# Patient Record
Sex: Male | Born: 1945 | Race: Black or African American | Hispanic: No | Marital: Married | Smoking: Former smoker
Health system: Southern US, Community
[De-identification: ages and names within clinical notes are randomized; demographics above are authoritative.]

## PROBLEM LIST (undated history)

## (undated) DIAGNOSIS — E785 Hyperlipidemia, unspecified: Secondary | ICD-10-CM

## (undated) DIAGNOSIS — M1990 Unspecified osteoarthritis, unspecified site: Secondary | ICD-10-CM

## (undated) DIAGNOSIS — Z860100 Personal history of colon polyps, unspecified: Secondary | ICD-10-CM

## (undated) DIAGNOSIS — Z87442 Personal history of urinary calculi: Secondary | ICD-10-CM

## (undated) DIAGNOSIS — M2540 Effusion, unspecified joint: Secondary | ICD-10-CM

## (undated) DIAGNOSIS — J302 Other seasonal allergic rhinitis: Secondary | ICD-10-CM

## (undated) DIAGNOSIS — M1712 Unilateral primary osteoarthritis, left knee: Secondary | ICD-10-CM

## (undated) DIAGNOSIS — N529 Male erectile dysfunction, unspecified: Secondary | ICD-10-CM

## (undated) DIAGNOSIS — I10 Essential (primary) hypertension: Secondary | ICD-10-CM

## (undated) DIAGNOSIS — K5790 Diverticulosis of intestine, part unspecified, without perforation or abscess without bleeding: Secondary | ICD-10-CM

## (undated) DIAGNOSIS — C61 Malignant neoplasm of prostate: Secondary | ICD-10-CM

## (undated) DIAGNOSIS — H269 Unspecified cataract: Secondary | ICD-10-CM

## (undated) DIAGNOSIS — M2550 Pain in unspecified joint: Secondary | ICD-10-CM

## (undated) HISTORY — PX: I & D EXTREMITY: SHX5045

## (undated) HISTORY — PX: MULTIPLE TOOTH EXTRACTIONS: SHX2053

## (undated) HISTORY — PX: BACK SURGERY: SHX140

## (undated) HISTORY — DX: Essential (primary) hypertension: I10

## (undated) HISTORY — DX: Hyperlipidemia, unspecified: E78.5

## (undated) HISTORY — DX: Unspecified osteoarthritis, unspecified site: M19.90

## (undated) HISTORY — PX: TONSILLECTOMY AND ADENOIDECTOMY: SUR1326

## (undated) HISTORY — PX: JOINT REPLACEMENT: SHX530

## (undated) HISTORY — PX: KNEE ARTHROSCOPY: SHX127

## (undated) HISTORY — DX: Unilateral primary osteoarthritis, left knee: M17.12

---
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
## 1969-06-13 HISTORY — PX: LUMBAR DISC SURGERY: SHX700

## 2008-02-28 ENCOUNTER — Ambulatory Visit: Payer: Self-pay | Admitting: Unknown Physician Specialty

## 2008-03-01 ENCOUNTER — Ambulatory Visit: Payer: Self-pay | Admitting: Unknown Physician Specialty

## 2008-03-07 ENCOUNTER — Other Ambulatory Visit: Payer: Self-pay

## 2008-03-07 ENCOUNTER — Ambulatory Visit: Payer: Self-pay | Admitting: Unknown Physician Specialty

## 2008-03-07 ENCOUNTER — Inpatient Hospital Stay: Payer: Self-pay | Admitting: Unknown Physician Specialty

## 2008-03-17 ENCOUNTER — Ambulatory Visit: Payer: Self-pay | Admitting: Unknown Physician Specialty

## 2011-07-14 HISTORY — PX: LIPOMA EXCISION: SHX5283

## 2011-07-14 HISTORY — PX: COLONOSCOPY W/ BIOPSIES AND POLYPECTOMY: SHX1376

## 2011-09-23 ENCOUNTER — Ambulatory Visit: Payer: Self-pay | Admitting: General Surgery

## 2011-09-30 ENCOUNTER — Ambulatory Visit: Payer: Self-pay | Admitting: General Surgery

## 2011-10-03 LAB — PATHOLOGY REPORT

## 2012-03-29 ENCOUNTER — Encounter (HOSPITAL_COMMUNITY): Payer: Self-pay | Admitting: Respiratory Therapy

## 2012-03-30 ENCOUNTER — Encounter: Payer: Self-pay | Admitting: Physician Assistant

## 2012-03-30 ENCOUNTER — Other Ambulatory Visit: Payer: Self-pay | Admitting: Physician Assistant

## 2012-03-30 DIAGNOSIS — M1990 Unspecified osteoarthritis, unspecified site: Secondary | ICD-10-CM

## 2012-03-30 DIAGNOSIS — M1711 Unilateral primary osteoarthritis, right knee: Secondary | ICD-10-CM

## 2012-03-30 DIAGNOSIS — I10 Essential (primary) hypertension: Secondary | ICD-10-CM | POA: Insufficient documentation

## 2012-03-30 NOTE — H&P (Signed)
Andre Jackson is an 65 y.o. male.   Chief Complaint: right knee DJD HPI: Andre Jackson comes in today for evaluation of his right knee.  He has a longstanding history of bilateral knee pain and has failed conservative care, including anti-inflammatories and intraarticular Cortisone injections.  At this point in time he has significant bilateral knee pain, right worse than left.  He has excruciating pain that has failed anti-inflammatories, pain medicine, home exercise program and intraarticular Cortisone injections.  He requires assistance to rise from a chair, assistance going up and down stairs.    Past Medical History  Diagnosis Date  . Hypertension   . Hyperlipidemia   . Arthritis   . Right knee DJD     Past Surgical History  Procedure Date  . Back surgery   . Dental surgery   . Colonoscopy   . Lipoma excision     neck    Family History  Problem Relation Age of Onset  . Cerebral aneurysm Mother   . Hypertension Sister   . Hypertension Mother   . Hypertension Brother    Social History:  reports that he quit smoking about 6 years ago. He does not have any smokeless tobacco history on file. He reports that he drinks alcohol. His drug history not on file.  Allergies: No Known Allergies Current Outpatient Prescriptions on File Prior to Visit  Medication Sig Dispense Refill  . aspirin 81 MG tablet Take 81 mg by mouth daily.      . atorvastatin (LIPITOR) 10 MG tablet Take 10 mg by mouth daily.      . fexofenadine (ALLEGRA) 180 MG tablet Take 180 mg by mouth daily.      . lisinopril-hydrochlorothiazide (PRINZIDE,ZESTORETIC) 20-25 MG per tablet Take 1 tablet by mouth daily.      . meloxicam (MOBIC) 15 MG tablet Take 15 mg by mouth daily.      . Multiple Vitamin (MULTIVITAMIN WITH MINERALS) TABS Take 1 tablet by mouth daily.        (Not in a hospital admission)  No results found for this or any previous visit (from the past 48 hour(s)). No results found.  Review of Systems    Constitutional: Negative.   HENT: Negative.   Eyes: Negative.   Respiratory: Negative.   Cardiovascular: Negative.   Gastrointestinal: Negative.   Genitourinary: Negative.   Musculoskeletal: Positive for joint pain.       Right knee  Skin: Negative.   Neurological: Negative.   Endo/Heme/Allergies: Negative.   Psychiatric/Behavioral: Negative.     Blood pressure 123/78, pulse 81, temperature 98.3 F (36.8 C), height 5' 7" (1.702 m), weight 81.647 kg (180 lb), SpO2 98.00%. Physical Exam  Constitutional: He is oriented to person, place, and time. He appears well-developed and well-nourished.  HENT:  Head: Normocephalic and atraumatic.  Mouth/Throat: Oropharynx is clear and moist.  Eyes: Conjunctivae are normal. Pupils are equal, round, and reactive to light.  Neck: Neck supple.  Cardiovascular: Normal rate and regular rhythm.   Respiratory: Effort normal and breath sounds normal.  GI: Soft. Bowel sounds are normal.  Genitourinary:       Not pertinent to current symptomatology therefore not examined.  Musculoskeletal:        He is alert and oriented x 3.  He is independently ambulatory with a moderately antalgic gait.  No assistive devices.  Right knee has active range of motion -5 to 120 degrees.  3+ crepitus.  Varus deformity.  1+ synovitis.  Medial joint   line tenderness.  Normal patella tracking.  Left knee has active range of motion -5 to 120 degrees.  2+ crepitus.  1+ synovitis.  Medial joint line tenderness.  Varus deformity.  2+ dorsalis pedis pulses.    Neurological: He is alert and oriented to person, place, and time.  Skin: Skin is warm and dry.     Assessment Patient Active Problem List  Diagnosis  . Hypertension  . Arthritis  . Right knee DJD    Plan At this point in time we discussed in detail the risks, benefits and possible complications of a right total knee replacement.  He understands this and is without question.  We will plan on getting him scheduled.  He  has gotten medical clearance by Dr. John Walker prior to scheduling his knee replacement. He will go home with his wife after surgery  Edelmiro Innocent J 03/30/2012, 4:04 PM    

## 2012-04-01 ENCOUNTER — Encounter (HOSPITAL_COMMUNITY)
Admission: RE | Admit: 2012-04-01 | Discharge: 2012-04-01 | Disposition: A | Discharge status: Home / Self Care | Payer: Medicare Other | Source: Ambulatory Visit | Attending: Orthopedic Surgery | Admitting: Orthopedic Surgery

## 2012-04-01 ENCOUNTER — Encounter (HOSPITAL_COMMUNITY): Payer: Self-pay

## 2012-04-01 HISTORY — DX: Effusion, unspecified joint: M25.40

## 2012-04-01 HISTORY — DX: Unspecified cataract: H26.9

## 2012-04-01 HISTORY — DX: Personal history of colonic polyps: Z86.010

## 2012-04-01 HISTORY — DX: Diverticulosis of intestine, part unspecified, without perforation or abscess without bleeding: K57.90

## 2012-04-01 HISTORY — DX: Other seasonal allergic rhinitis: J30.2

## 2012-04-01 HISTORY — DX: Personal history of colon polyps, unspecified: Z86.0100

## 2012-04-01 HISTORY — DX: Pain in unspecified joint: M25.50

## 2012-04-01 LAB — PROTIME-INR
INR: 0.97 (ref 0.00–1.49)
Prothrombin Time: 13.1 seconds (ref 11.6–15.2)

## 2012-04-01 LAB — COMPREHENSIVE METABOLIC PANEL
Albumin: 3.9 g/dL (ref 3.5–5.2)
Alkaline Phosphatase: 68 U/L (ref 39–117)
BUN: 16 mg/dL (ref 6–23)
Calcium: 9.4 mg/dL (ref 8.4–10.5)
GFR calc Af Amer: >90 mL/min (ref >90)
Potassium: 3.5 mEq/L (ref 3.5–5.1)
Sodium: 141 mEq/L (ref 135–145)
Total Protein: 7.5 g/dL (ref 6.0–8.3)

## 2012-04-01 LAB — URINALYSIS, ROUTINE W REFLEX MICROSCOPIC
Bilirubin Urine: NEGATIVE
Ketones, ur: NEGATIVE mg/dL
Leukocytes, UA: NEGATIVE
Nitrite: NEGATIVE
Protein, ur: NEGATIVE mg/dL
Urobilinogen, UA: 1 mg/dL (ref 0.0–1.0)
pH: 6 (ref 5.0–8.0)

## 2012-04-01 LAB — DIFFERENTIAL
Eosinophils Absolute: 0.1 10*3/uL (ref 0.0–0.7)
Lymphocytes Relative: 46 % (ref 12–46)
Lymphs Abs: 2.5 10*3/uL (ref 0.7–4.0)
Monocytes Relative: 10 % (ref 3–12)
Neutrophils Relative %: 42 % — ABNORMAL LOW (ref 43–77)

## 2012-04-01 LAB — APTT: aPTT: 29 seconds (ref 24–37)

## 2012-04-01 LAB — TYPE AND SCREEN
ABO/RH(D): A POS
Antibody Screen: NEGATIVE

## 2012-04-01 LAB — CBC
HCT: 41.8 % (ref 39.0–52.0)
MCH: 30.1 pg (ref 26.0–34.0)
MCHC: 34 g/dL (ref 30.0–36.0)
RDW: 13.6 % (ref 11.5–15.5)

## 2012-04-01 LAB — SURGICAL PCR SCREEN: MRSA, PCR: NEGATIVE

## 2012-04-01 MED ORDER — CHLORHEXIDINE GLUCONATE 4 % EX LIQD: 60.0000 mL | Freq: Once | CUTANEOUS | Status: DC

## 2012-04-01 NOTE — Progress Notes (Signed)
Pt doesn't have a cardiologist  Denies ever having a stress test/echo/heart cath  Medical Md is Dr.John Environmental consultant at [facility] in [place]  Last EKG and CXR done in Nov 2012-to request from [place]

## 2012-04-01 NOTE — Pre-Procedure Instructions (Signed)
18 Andre Jackson  04/01/2012   Your procedure is scheduled on:  Mon, June 24 @! 8:45 AM  Report to [facility] at 5:45 AM.  Call this number if you have problems the morning of surgery: [phone]   Remember:   Do not eat food:After Midnight.  Take these medicines the morning of surgery with A SIP OF WATER: Allegra(Fexofenadine)   Do not wear jewelry  Do not wear lotions, powders, or cologne  Men may shave face and neck.  Do not bring valuables to the hospital.  Contacts, dentures or bridgework may not be worn into surgery.  Leave suitcase in the car. After surgery it may be brought to your room.  For patients admitted to the hospital, checkout time is 11:00 AM the day of discharge.   Patients discharged the day of surgery will not be allowed to drive home.  Special Instructions: CHG Shower Use Special Wash: 1/2 bottle night before surgery and 1/2 bottle morning of surgery.   Please read over the following fact sheets that you were given: Pain Booklet, Coughing and Deep Breathing, Blood Transfusion Information, Total Joint Packet, MRSA Information and Surgical Site Infection Prevention

## 2012-04-02 LAB — ABO/RH: ABO/RH(D): A POS

## 2012-04-02 LAB — URINE CULTURE: Culture  Setup Time: 201306201403

## 2012-04-04 MED ORDER — CEFAZOLIN SODIUM-DEXTROSE 2-3 GM-% IV SOLR
2.0000 g | INTRAVENOUS | Status: AC
  Administered 2012-04-05: 2 g via INTRAVENOUS
  Filled 2012-04-04: qty 50

## 2012-04-04 MED ORDER — POVIDONE-IODINE 7.5 % EX SOLN
Freq: Once | CUTANEOUS | Status: DC
  Filled 2012-04-04: qty 118

## 2012-04-04 MED ORDER — LACTATED RINGERS IV SOLN
INTRAVENOUS | Status: DC
  Administered 2012-04-05: 09:00:00 via INTRAVENOUS

## 2012-04-05 ENCOUNTER — Encounter (HOSPITAL_COMMUNITY)
Admission: RE | Disposition: A | Discharge status: Home Health | Payer: Self-pay | Source: Ambulatory Visit | Attending: Orthopedic Surgery

## 2012-04-05 ENCOUNTER — Encounter (HOSPITAL_COMMUNITY): Payer: Self-pay | Admitting: Certified Registered"

## 2012-04-05 ENCOUNTER — Inpatient Hospital Stay (HOSPITAL_COMMUNITY)
Admission: RE | Admit: 2012-04-05 | Discharge: 2012-04-07 | DRG: 470 | Disposition: A | Discharge status: Home Health | Payer: Medicare Other | Source: Ambulatory Visit | Attending: Orthopedic Surgery | Admitting: Orthopedic Surgery

## 2012-04-05 ENCOUNTER — Ambulatory Visit (HOSPITAL_COMMUNITY): Payer: Medicare Other

## 2012-04-05 ENCOUNTER — Ambulatory Visit (HOSPITAL_COMMUNITY): Payer: Medicare Other | Admitting: Certified Registered"

## 2012-04-05 ENCOUNTER — Encounter (HOSPITAL_COMMUNITY): Payer: Self-pay | Admitting: *Deleted

## 2012-04-05 DIAGNOSIS — M1710 Unilateral primary osteoarthritis, unspecified knee: Principal | ICD-10-CM | POA: Diagnosis present

## 2012-04-05 DIAGNOSIS — E785 Hyperlipidemia, unspecified: Secondary | ICD-10-CM | POA: Diagnosis present

## 2012-04-05 DIAGNOSIS — M1990 Unspecified osteoarthritis, unspecified site: Secondary | ICD-10-CM | POA: Diagnosis present

## 2012-04-05 DIAGNOSIS — I10 Essential (primary) hypertension: Secondary | ICD-10-CM | POA: Diagnosis present

## 2012-04-05 DIAGNOSIS — M1711 Unilateral primary osteoarthritis, right knee: Secondary | ICD-10-CM

## 2012-04-05 DIAGNOSIS — D62 Acute posthemorrhagic anemia: Secondary | ICD-10-CM | POA: Diagnosis present

## 2012-04-05 DIAGNOSIS — Z01812 Encounter for preprocedural laboratory examination: Secondary | ICD-10-CM

## 2012-04-05 HISTORY — PX: REPLACEMENT TOTAL KNEE: SUR1224

## 2012-04-05 HISTORY — PX: TOTAL KNEE ARTHROPLASTY: SHX125

## 2012-04-05 SURGERY — ARTHROPLASTY, KNEE, TOTAL
Anesthesia: General | Site: Knee | Laterality: Right | Wound class: Clean

## 2012-04-05 MED ORDER — MIDAZOLAM HCL 2 MG/2ML IJ SOLN
INTRAMUSCULAR | Status: AC
  Filled 2012-04-05: qty 2

## 2012-04-05 MED ORDER — ENOXAPARIN SODIUM 30 MG/0.3ML ~~LOC~~ SOLN
30.0000 mg | Freq: Two times a day (BID) | SUBCUTANEOUS | Status: DC
  Administered 2012-04-06 – 2012-04-07 (×3): 30 mg via SUBCUTANEOUS
  Filled 2012-04-05 (×5): qty 0.3

## 2012-04-05 MED ORDER — ACETAMINOPHEN 650 MG RE SUPP: 650.0000 mg | Freq: Four times a day (QID) | RECTAL | Status: DC | PRN

## 2012-04-05 MED ORDER — HYDROMORPHONE HCL PF 1 MG/ML IJ SOLN
0.2500 mg | INTRAMUSCULAR | Status: DC | PRN
  Administered 2012-04-05 (×2): 0.5 mg via INTRAVENOUS

## 2012-04-05 MED ORDER — CEFUROXIME SODIUM 1.5 G IJ SOLR
INTRAMUSCULAR | Status: DC | PRN
  Administered 2012-04-05: 1.5 g

## 2012-04-05 MED ORDER — PHENYLEPHRINE HCL 10 MG/ML IJ SOLN
INTRAMUSCULAR | Status: DC | PRN
  Administered 2012-04-05: 40 ug via INTRAVENOUS
  Administered 2012-04-05 (×2): 80 ug via INTRAVENOUS

## 2012-04-05 MED ORDER — FENTANYL CITRATE 0.05 MG/ML IJ SOLN: 100.0000 ug | Freq: Once | INTRAMUSCULAR | Status: DC

## 2012-04-05 MED ORDER — FENTANYL CITRATE 0.05 MG/ML IJ SOLN
INTRAMUSCULAR | Status: AC
  Filled 2012-04-05: qty 2

## 2012-04-05 MED ORDER — ACETAMINOPHEN 10 MG/ML IV SOLN
1000.0000 mg | Freq: Once | INTRAVENOUS | Status: AC | PRN
  Administered 2012-04-05: 1000 mg via INTRAVENOUS

## 2012-04-05 MED ORDER — CELECOXIB 200 MG PO CAPS
200.0000 mg | ORAL_CAPSULE | Freq: Two times a day (BID) | ORAL | Status: DC
  Administered 2012-04-05 – 2012-04-07 (×5): 200 mg via ORAL
  Filled 2012-04-05 (×6): qty 1

## 2012-04-05 MED ORDER — ATORVASTATIN CALCIUM 10 MG PO TABS
10.0000 mg | ORAL_TABLET | Freq: Every day | ORAL | Status: DC
  Administered 2012-04-06 – 2012-04-07 (×2): 10 mg via ORAL
  Filled 2012-04-05 (×2): qty 1

## 2012-04-05 MED ORDER — PHENOL 1.4 % MT LIQD: 1.0000 | OROMUCOSAL | Status: DC | PRN

## 2012-04-05 MED ORDER — POTASSIUM CHLORIDE IN NACL 20-0.9 MEQ/L-% IV SOLN
INTRAVENOUS | Status: DC
  Administered 2012-04-05 – 2012-04-06 (×2): via INTRAVENOUS
  Filled 2012-04-05 (×6): qty 1000

## 2012-04-05 MED ORDER — FENTANYL CITRATE 0.05 MG/ML IJ SOLN
INTRAMUSCULAR | Status: DC | PRN
  Administered 2012-04-05: 100 ug via INTRAVENOUS
  Administered 2012-04-05 (×2): 50 ug via INTRAVENOUS

## 2012-04-05 MED ORDER — HYDROMORPHONE HCL PF 1 MG/ML IJ SOLN
INTRAMUSCULAR | Status: AC
  Filled 2012-04-05: qty 1

## 2012-04-05 MED ORDER — SORBITOL 70 % SOLN
30.0000 mL | Freq: Two times a day (BID) | Status: DC
  Administered 2012-04-05 – 2012-04-07 (×4): 30 mL via ORAL
  Filled 2012-04-05 (×7): qty 30

## 2012-04-05 MED ORDER — PROPOFOL 10 MG/ML IV EMUL
INTRAVENOUS | Status: DC | PRN
  Administered 2012-04-05: 170 mg via INTRAVENOUS

## 2012-04-05 MED ORDER — METOCLOPRAMIDE HCL 5 MG/ML IJ SOLN: 5.0000 mg | Freq: Three times a day (TID) | INTRAMUSCULAR | Status: DC | PRN

## 2012-04-05 MED ORDER — LORATADINE 10 MG PO TABS
10.0000 mg | ORAL_TABLET | Freq: Every day | ORAL | Status: DC
  Administered 2012-04-05 – 2012-04-07 (×3): 10 mg via ORAL
  Filled 2012-04-05 (×3): qty 1

## 2012-04-05 MED ORDER — CEFUROXIME SODIUM 1.5 G IJ SOLR
INTRAMUSCULAR | Status: AC
  Filled 2012-04-05: qty 1.5

## 2012-04-05 MED ORDER — MENTHOL 3 MG MT LOZG: 1.0000 | LOZENGE | OROMUCOSAL | Status: DC | PRN

## 2012-04-05 MED ORDER — MIDAZOLAM HCL 2 MG/2ML IJ SOLN: 2.0000 mg | Freq: Once | INTRAMUSCULAR | Status: DC

## 2012-04-05 MED ORDER — LISINOPRIL 20 MG PO TABS
20.0000 mg | ORAL_TABLET | Freq: Every day | ORAL | Status: DC
  Administered 2012-04-07: 20 mg via ORAL
  Filled 2012-04-05 (×3): qty 1

## 2012-04-05 MED ORDER — HYDROMORPHONE HCL PF 1 MG/ML IJ SOLN
0.5000 mg | INTRAMUSCULAR | Status: DC | PRN
  Administered 2012-04-05 (×2): 1 mg via INTRAVENOUS
  Filled 2012-04-05 (×2): qty 1

## 2012-04-05 MED ORDER — BUPIVACAINE-EPINEPHRINE PF 0.25-1:200000 % IJ SOLN
INTRAMUSCULAR | Status: AC
  Filled 2012-04-05: qty 30

## 2012-04-05 MED ORDER — FENTANYL CITRATE 0.05 MG/ML IJ SOLN
50.0000 ug | INTRAMUSCULAR | Status: DC | PRN
  Administered 2012-04-05: 50 ug via INTRAVENOUS

## 2012-04-05 MED ORDER — ACETAMINOPHEN 325 MG PO TABS
650.0000 mg | ORAL_TABLET | Freq: Four times a day (QID) | ORAL | Status: DC | PRN
  Administered 2012-04-06: 650 mg via ORAL
  Filled 2012-04-05: qty 2

## 2012-04-05 MED ORDER — MIDAZOLAM HCL 2 MG/2ML IJ SOLN
1.0000 mg | INTRAMUSCULAR | Status: DC | PRN
  Administered 2012-04-05: 1 mg via INTRAVENOUS

## 2012-04-05 MED ORDER — BUPIVACAINE-EPINEPHRINE 0.25% -1:200000 IJ SOLN
INTRAMUSCULAR | Status: DC | PRN
  Administered 2012-04-05: 30 mL

## 2012-04-05 MED ORDER — ADULT MULTIVITAMIN W/MINERALS CH
1.0000 | ORAL_TABLET | Freq: Every day | ORAL | Status: DC
  Administered 2012-04-05 – 2012-04-07 (×3): 1 via ORAL
  Filled 2012-04-05 (×3): qty 1

## 2012-04-05 MED ORDER — ONDANSETRON HCL 4 MG/2ML IJ SOLN: 4.0000 mg | Freq: Once | INTRAMUSCULAR | Status: DC | PRN

## 2012-04-05 MED ORDER — SODIUM CHLORIDE 0.9 % IR SOLN
Status: DC | PRN
  Administered 2012-04-05: 1000 mL

## 2012-04-05 MED ORDER — LACTATED RINGERS IV SOLN
INTRAVENOUS | Status: DC | PRN
  Administered 2012-04-05 (×2): via INTRAVENOUS

## 2012-04-05 MED ORDER — ONDANSETRON HCL 4 MG/2ML IJ SOLN: 4.0000 mg | Freq: Four times a day (QID) | INTRAMUSCULAR | Status: DC | PRN

## 2012-04-05 MED ORDER — ONDANSETRON HCL 4 MG PO TABS: 4.0000 mg | ORAL_TABLET | Freq: Four times a day (QID) | ORAL | Status: DC | PRN

## 2012-04-05 MED ORDER — LIDOCAINE HCL (CARDIAC) 20 MG/ML IV SOLN
INTRAVENOUS | Status: DC | PRN
  Administered 2012-04-05: 40 mg via INTRAVENOUS

## 2012-04-05 MED ORDER — METOCLOPRAMIDE HCL 10 MG PO TABS: 5.0000 mg | ORAL_TABLET | Freq: Three times a day (TID) | ORAL | Status: DC | PRN

## 2012-04-05 MED ORDER — CEFAZOLIN SODIUM-DEXTROSE 2-3 GM-% IV SOLR
2.0000 g | Freq: Four times a day (QID) | INTRAVENOUS | Status: AC
  Administered 2012-04-05 (×2): 2 g via INTRAVENOUS
  Filled 2012-04-05 (×2): qty 50

## 2012-04-05 MED ORDER — ACETAMINOPHEN 10 MG/ML IV SOLN
INTRAVENOUS | Status: AC
  Filled 2012-04-05: qty 100

## 2012-04-05 MED ORDER — DOCUSATE SODIUM 100 MG PO CAPS
100.0000 mg | ORAL_CAPSULE | Freq: Two times a day (BID) | ORAL | Status: DC
  Administered 2012-04-05 – 2012-04-07 (×5): 100 mg via ORAL
  Filled 2012-04-05 (×5): qty 1

## 2012-04-05 MED ORDER — OXYCODONE HCL 5 MG PO TABS
5.0000 mg | ORAL_TABLET | ORAL | Status: DC | PRN
  Administered 2012-04-05 – 2012-04-06 (×4): 5 mg via ORAL
  Filled 2012-04-05 (×3): qty 1
  Filled 2012-04-05: qty 2
  Filled 2012-04-05: qty 1

## 2012-04-05 SURGICAL SUPPLY — 68 items
BANDAGE ELASTIC 6 VELCRO ST LF (GAUZE/BANDAGES/DRESSINGS) ×2 IMPLANT
BANDAGE ESMARK 6X9 LF (GAUZE/BANDAGES/DRESSINGS) ×1 IMPLANT
BLADE SAGITTAL 25.0X1.19X90 (BLADE) ×2 IMPLANT
BLADE SAW SGTL 11.0X1.19X90.0M (BLADE) IMPLANT
BLADE SAW SGTL 13.0X1.19X90.0M (BLADE) ×2 IMPLANT
BLADE SURG 10 STRL SS (BLADE) ×4 IMPLANT
BNDG ELASTIC 6X15 VLCR STRL LF (GAUZE/BANDAGES/DRESSINGS) ×2 IMPLANT
BNDG ESMARK 6X9 LF (GAUZE/BANDAGES/DRESSINGS) ×2
BOWL SMART MIX CTS (DISPOSABLE) ×2 IMPLANT
CEMENT HV SMART SET (Cement) ×4 IMPLANT
CLOTH BEACON ORANGE TIMEOUT ST (SAFETY) ×2 IMPLANT
CLSR STERI-STRIP ANTIMIC 1/2X4 (GAUZE/BANDAGES/DRESSINGS) ×2 IMPLANT
COVER BACK TABLE 24X17X13 BIG (DRAPES) ×2 IMPLANT
COVER PROBE W GEL 5X96 (DRAPES) IMPLANT
COVER SURGICAL LIGHT HANDLE (MISCELLANEOUS) ×2 IMPLANT
CUFF TOURNIQUET SINGLE 34IN LL (TOURNIQUET CUFF) ×2 IMPLANT
CUFF TOURNIQUET SINGLE 44IN (TOURNIQUET CUFF) IMPLANT
DRAPE EXTREMITY T 121X128X90 (DRAPE) ×2 IMPLANT
DRAPE INCISE IOBAN 66X45 STRL (DRAPES) ×2 IMPLANT
DRAPE PROXIMA HALF (DRAPES) ×2 IMPLANT
DRAPE U-SHAPE 47X51 STRL (DRAPES) ×2 IMPLANT
DRSG ADAPTIC 3X8 NADH LF (GAUZE/BANDAGES/DRESSINGS) ×2 IMPLANT
DRSG PAD ABDOMINAL 8X10 ST (GAUZE/BANDAGES/DRESSINGS) ×2 IMPLANT
DURAPREP 26ML APPLICATOR (WOUND CARE) ×2 IMPLANT
ELECT CAUTERY BLADE 6.4 (BLADE) ×2 IMPLANT
ELECT REM PT RETURN 9FT ADLT (ELECTROSURGICAL) ×2
ELECTRODE REM PT RTRN 9FT ADLT (ELECTROSURGICAL) ×1 IMPLANT
EVACUATOR 1/8 PVC DRAIN (DRAIN) ×2 IMPLANT
FACESHIELD LNG OPTICON STERILE (SAFETY) ×2 IMPLANT
GLOVE BIO SURGEON STRL SZ7 (GLOVE) ×2 IMPLANT
GLOVE BIOGEL PI IND STRL 7.0 (GLOVE) ×1 IMPLANT
GLOVE BIOGEL PI IND STRL 7.5 (GLOVE) ×1 IMPLANT
GLOVE BIOGEL PI INDICATOR 7.0 (GLOVE) ×1
GLOVE BIOGEL PI INDICATOR 7.5 (GLOVE) ×1
GLOVE SS BIOGEL STRL SZ 7.5 (GLOVE) ×1 IMPLANT
GLOVE SUPERSENSE BIOGEL SZ 7.5 (GLOVE) ×1
GOWN PREVENTION PLUS XLARGE (GOWN DISPOSABLE) ×4 IMPLANT
GOWN STRL NON-REIN LRG LVL3 (GOWN DISPOSABLE) ×4 IMPLANT
HANDPIECE INTERPULSE COAX TIP (DISPOSABLE) ×1
HOOD PEEL AWAY FACE SHEILD DIS (HOOD) ×4 IMPLANT
IMMOBILIZER KNEE 22 UNIV (SOFTGOODS) IMPLANT
INSERT CUSHION PRONEVIEW LG (MISCELLANEOUS) ×2 IMPLANT
KIT BASIN OR (CUSTOM PROCEDURE TRAY) ×2 IMPLANT
KIT ROOM TURNOVER OR (KITS) ×2 IMPLANT
MANIFOLD NEPTUNE II (INSTRUMENTS) ×2 IMPLANT
NS IRRIG 1000ML POUR BTL (IV SOLUTION) ×2 IMPLANT
PACK TOTAL JOINT (CUSTOM PROCEDURE TRAY) ×2 IMPLANT
PAD ARMBOARD 7.5X6 YLW CONV (MISCELLANEOUS) ×4 IMPLANT
PAD CAST 4YDX4 CTTN HI CHSV (CAST SUPPLIES) ×1 IMPLANT
PADDING CAST COTTON 4X4 STRL (CAST SUPPLIES) ×1
PADDING CAST COTTON 6X4 STRL (CAST SUPPLIES) ×2 IMPLANT
POSITIONER HEAD PRONE TRACH (MISCELLANEOUS) ×2 IMPLANT
RUBBERBAND STERILE (MISCELLANEOUS) ×2 IMPLANT
SET HNDPC FAN SPRY TIP SCT (DISPOSABLE) ×1 IMPLANT
SPONGE GAUZE 4X4 12PLY (GAUZE/BANDAGES/DRESSINGS) ×2 IMPLANT
STRIP CLOSURE SKIN 1/2X4 (GAUZE/BANDAGES/DRESSINGS) ×2 IMPLANT
SUCTION FRAZIER TIP 10 FR DISP (SUCTIONS) ×2 IMPLANT
SUT MNCRL AB 3-0 PS2 18 (SUTURE) ×2 IMPLANT
SUT VIC AB 0 CT1 27 (SUTURE) ×2
SUT VIC AB 0 CT1 27XBRD ANBCTR (SUTURE) ×2 IMPLANT
SUT VIC AB 2-0 CT1 27 (SUTURE) ×2
SUT VIC AB 2-0 CT1 TAPERPNT 27 (SUTURE) ×2 IMPLANT
SUT VLOC 180 0 24IN GS25 (SUTURE) ×2 IMPLANT
SYR 30ML SLIP (SYRINGE) ×2 IMPLANT
TOWEL OR 17X24 6PK STRL BLUE (TOWEL DISPOSABLE) ×2 IMPLANT
TOWEL OR 17X26 10 PK STRL BLUE (TOWEL DISPOSABLE) ×2 IMPLANT
TRAY FOLEY CATH 14FR (SET/KITS/TRAYS/PACK) ×2 IMPLANT
WATER STERILE IRR 1000ML POUR (IV SOLUTION) ×6 IMPLANT

## 2012-04-05 NOTE — Anesthesia Preprocedure Evaluation (Addendum)
Anesthesia Evaluation  Patient identified by MRN, date of birth, ID band Patient awake    Reviewed: Allergy & Precautions, H&P , NPO status , Patient's Chart, lab work & pertinent test results  Airway Mallampati: I      Dental  (+) Teeth Intact   Pulmonary  breath sounds clear to auscultation        Cardiovascular hypertension, Rhythm:Regular Rate:Normal     Neuro/Psych    GI/Hepatic   Endo/Other    Renal/GU      Musculoskeletal   Abdominal   Peds  Hematology   Anesthesia Other Findings   Reproductive/Obstetrics                          Anesthesia Physical Anesthesia Plan  ASA: II  Anesthesia Plan: General   Post-op Pain Management:    Induction: Intravenous  Airway Management Planned: LMA  Additional Equipment:   Intra-op Plan:   Post-operative Plan:   Informed Consent: I have reviewed the patients History and Physical, chart, labs and discussed the procedure including the risks, benefits and alternatives for the proposed anesthesia with the patient or authorized representative who has indicated his/her understanding and acceptance.   Dental advisory given  Plan Discussed with:   Anesthesia Plan Comments: (DJD R. Knee Htn   Plan GA with FNB  Kipp Brood, MD)       Anesthesia Quick Evaluation

## 2012-04-05 NOTE — Transfer of Care (Signed)
Immediate Anesthesia Transfer of Care Note  Patient: Andre Jackson  Procedure(s) Performed: Procedure(s) (LRB): TOTAL KNEE ARTHROPLASTY (Right)  Patient Location: PACU  Anesthesia Type: General  Level of Consciousness: awake, alert  and oriented  Airway & Oxygen Therapy: Patient Spontanous Breathing and Patient connected to nasal cannula oxygen  Post-op Assessment: Report given to PACU RN and Post -op Vital signs reviewed and stable  Post vital signs: Reviewed and stable  Complications: No apparent anesthesia complications

## 2012-04-05 NOTE — Plan of Care (Signed)
Problem: Consults Goal: Diagnosis- Total Joint Replacement Primary Total Knee Right     

## 2012-04-05 NOTE — H&P (View-Only) (Signed)
Andre Jackson is an 66 y.o. male.   Chief Complaint: right knee DJD HPI: Andre Jackson comes in today for evaluation of his right knee.  He has a longstanding history of bilateral knee pain and has failed conservative care, including anti-inflammatories and intraarticular Cortisone injections.  At this point in time he has significant bilateral knee pain, right worse than left.  He has excruciating pain that has failed anti-inflammatories, pain medicine, home exercise program and intraarticular Cortisone injections.  He requires assistance to rise from a chair, assistance going up and down stairs.    Past Medical History  Diagnosis Date  . Hypertension   . Hyperlipidemia   . Arthritis   . Right knee DJD     Past Surgical History  Procedure Date  . Back surgery   . Dental surgery   . Colonoscopy   . Lipoma excision     neck    Family History  Problem Relation Age of Onset  . Cerebral aneurysm Mother   . Hypertension Sister   . Hypertension Mother   . Hypertension Brother    Social History:  reports that he quit smoking about 6 years ago. He does not have any smokeless tobacco history on file. He reports that he drinks alcohol. His drug history not on file.  Allergies: No Known Allergies Current Outpatient Prescriptions on File Prior to Visit  Medication Sig Dispense Refill  . aspirin 81 MG tablet Take 81 mg by mouth daily.      Marland Kitchen atorvastatin (LIPITOR) 10 MG tablet Take 10 mg by mouth daily.      . fexofenadine (ALLEGRA) 180 MG tablet Take 180 mg by mouth daily.      Marland Kitchen lisinopril-hydrochlorothiazide (PRINZIDE,ZESTORETIC) 20-25 MG per tablet Take 1 tablet by mouth daily.      . meloxicam (MOBIC) 15 MG tablet Take 15 mg by mouth daily.      . Multiple Vitamin (MULTIVITAMIN WITH MINERALS) TABS Take 1 tablet by mouth daily.        (Not in a hospital admission)  No results found for this or any previous visit (from the past 48 hour(s)). No results found.  Review of Systems    Constitutional: Negative.   HENT: Negative.   Eyes: Negative.   Respiratory: Negative.   Cardiovascular: Negative.   Gastrointestinal: Negative.   Genitourinary: Negative.   Musculoskeletal: Positive for joint pain.       Right knee  Skin: Negative.   Neurological: Negative.   Endo/Heme/Allergies: Negative.   Psychiatric/Behavioral: Negative.     Blood pressure 123/78, pulse 81, temperature 98.3 F (36.8 C), height 5\' 7"  (1.702 m), weight 81.647 kg (180 lb), SpO2 98.00%. Physical Exam  Constitutional: He is oriented to person, place, and time. He appears well-developed and well-nourished.  HENT:  Head: Normocephalic and atraumatic.  Mouth/Throat: Oropharynx is clear and moist.  Eyes: Conjunctivae are normal. Pupils are equal, round, and reactive to light.  Neck: Neck supple.  Cardiovascular: Normal rate and regular rhythm.   Respiratory: Effort normal and breath sounds normal.  GI: Soft. Bowel sounds are normal.  Genitourinary:       Not pertinent to current symptomatology therefore not examined.  Musculoskeletal:        He is alert and oriented x 3.  He is independently ambulatory with a moderately antalgic gait.  No assistive devices.  Right knee has active range of motion -5 to 120 degrees.  3+ crepitus.  Varus deformity.  1+ synovitis.  Medial joint  line tenderness.  Normal patella tracking.  Left knee has active range of motion -5 to 120 degrees.  2+ crepitus.  1+ synovitis.  Medial joint line tenderness.  Varus deformity.  2+ dorsalis pedis pulses.    Neurological: He is alert and oriented to person, place, and time.  Skin: Skin is warm and dry.     Assessment Patient Active Problem List  Diagnosis  . Hypertension  . Arthritis  . Right knee DJD    Plan At this point in time we discussed in detail the risks, benefits and possible complications of a right total knee replacement.  He understands this and is without question.  We will plan on getting him scheduled.  He  has gotten medical clearance by Dr. Yates Decamp prior to scheduling his knee replacement. He will go home with his wife after surgery  Andre Jackson 03/30/2012, 4:04 PM

## 2012-04-05 NOTE — Progress Notes (Signed)
Orthopedic Tech Progress Note Patient Details:  Andre Jackson Dec 29, 1945 [id number] Trapeze applied CPM Right Knee CPM Right Knee: On Right Knee Flexion (Degrees): 90  Right Knee Extension (Degrees): 0    Asia R Thompson 04/05/2012, 12:03 PM

## 2012-04-05 NOTE — Progress Notes (Signed)
Late entry prior to placement of block time out completed per hospital policy

## 2012-04-05 NOTE — Op Note (Signed)
MRN:     [id number] DOB/AGE:    June 01, 1946 / 66 y.o.       OPERATIVE REPORT    DATE OF PROCEDURE:  04/05/2012       PREOPERATIVE DIAGNOSIS:   DJD RIGHT KNEE      There is no height or weight on file to calculate BMI.                                                        POSTOPERATIVE DIAGNOSIS:   DJD RIGHT KNEE                                                                      PROCEDURE:  Procedure(s): TOTAL KNEE ARTHROPLASTY Using Depuy Sigma RP implants #4 Femur, #5Tibia, 12.73mm sigma RP bearing, 35 Patella     SURGEON: Brittinie Wherley Jackson    ASSISTANT:  Kirstin Shepperson PA-C   (Present and scrubbed throughout the case, critical for assistance with exposure, retraction, instrumentation, and closure.)         ANESTHESIA: GET with Femoral Nerve Block  DRAINS: foley, 2 medium hemovac in knee   TOURNIQUET TIME:   COMPLICATIONS:  None     SPECIMENS: None   INDICATIONS FOR PROCEDURE: The patient has  DJD RIGHT KNEE, varus deformities, XR shows bone on bone arthritis. Patient has failed all conservative measures including anti-inflammatory medicines, narcotics, attempts at  exercise and weight loss, cortisone injections and viscosupplementation.  Risks and benefits of surgery have been discussed, questions answered.   DESCRIPTION OF PROCEDURE: The patient identified by armband, received  right femoral nerve block and IV antibiotics, in the holding area at [facility]. Patient taken to the operating room, appropriate anesthetic  monitors were attached General endotracheal anesthesia induced with  the patient in supine position, Foley catheter was inserted. Tourniquet  applied high to the operative thigh. Lateral post and foot positioner  applied to the table, the lower extremity was then prepped and draped  in usual sterile fashion from the ankle to the tourniquet. Time-out procedure was performed. The limb was wrapped with an Esmarch bandage and the tourniquet inflated to  365 mmHg. We began the operation by making the anterior midline incision starting at handbreadth above the patella going over the patella 1 cm medial to and  4 cm distal to the tibial tubercle. Small bleeders in the skin and the  subcutaneous tissue identified and cauterized. Transverse retinaculum was incised and reflected medially and Jackson medial parapatellar arthrotomy was accomplished. the patella was everted and theprepatellar fat pad resected. The superficial medial collateral  ligament was then elevated from anterior to posterior along the proximal  flare of the tibia and anterior half of the menisci resected. The knee was hyperflexed exposing bone on bone arthritis. Peripheral and notch osteophytes as well as the cruciate ligaments were then resected. We continued to  work our way around posteriorly along the proximal tibia, and externally  rotated the tibia subluxing it out from underneath the femur. Jackson McHale  retractor was placed through the notch and Jackson lateral  Hohmann retractor  placed, and we then drilled through the proximal tibia in line with the  axis of the tibia followed by an intramedullary guide rod and 2-degree  posterior slope cutting guide. The tibial cutting guide was pinned into place  allowing resection of 4 mm of bone medially and about 8 mm of bone  laterally because of her varus deformity. Satisfied with the tibial resection, we then  entered the distal femur 2 mm anterior to the PCL origin with the  intramedullary guide rod and applied the distal femoral cutting guide  set at 11mm, with 5 degrees of valgus. This was pinned along the  epicondylar axis. At this point, the distal femoral cut was accomplished without difficulty. We then sized for Jackson #4 femoral component and pinned the guide in 3 degrees of external rotation.The chamfer cutting guide was pinned into place. The anterior, posterior, and chamfer cuts were accomplished without difficulty followed by  the Sigma RP box  cutting guide and the box cut. We also removed posterior osteophytes from the posterior femoral condyles. At this  time, the knee was brought into full extension. We checked our  extension and flexion gaps and found them symmetric at 10mm.  The patella thickness measured at 26 mm. We set the cutting guide at 17 and removed the posterior 9.5-10 mm  of the patella sized for 35 button and drilled the lollipop. The knee  was then once again hyperflexed exposing the proximal tibia. We sized for Jackson #5 tibial base plate, applied the smokestack and the conical reamer followed by the the Delta fin keel punch. We then hammered into place the Sigma RP trial femoral component, inserted Jackson 12.5-mm trial bearing, trial patellar button, and took the knee through range of motion from 0-130 degrees. No thumb pressure was required for patellar  tracking. At this point, all trial components were removed, Jackson double batch of DePuy HV cement with 1500 mg of Zinacef was mixed and applied to all bony metallic mating surfaces except for the posterior condyles of the femur itself. In order, we  hammered into place the tibial tray and removed excess cement, the femoral component and removed excess cement, Jackson 12.5-mm Sigma RP bearing  was inserted, and the knee brought to full extension with compression.  The patellar button was clamped into place, and excess cement  removed. While the cement cured the wound was irrigated out with normal saline solution pulse lavage, and medium Hemovac drains were placed.. Ligament stability and patellar tracking were checked and found to be excellent. The tourniquet was then released and hemostasis was obtained with cautery. The parapatellar arthrotomy was closed with  Z lock suture. The subcutaneous tissue with 0 and 2-0 undyed  Vicryl suture, and 4-0 Monocryl.. Jackson dressing of Xeroform,  4 x 4, dressing sponges, Webril, and Ace wrap applied. Needle and sponge count were correct times 2.The patient  awakened, extubated, and taken to recovery room without difficulty. Vascular status was normal, pulses 2+ and symmetric.   Andre Jackson 04/05/2012, 11:04 AM

## 2012-04-05 NOTE — Preoperative (Signed)
Beta Blockers   Reason not to administer Beta Blockers:Not Applicable 

## 2012-04-05 NOTE — Anesthesia Procedure Notes (Signed)
Procedure Name: LMA Insertion Date/Time: 04/05/2012 9:25 AM Performed by: Rossie Muskrat L Pre-anesthesia Checklist: Patient identified, Timeout performed, Emergency Drugs available, Suction available and Patient being monitored Patient Re-evaluated:Patient Re-evaluated prior to inductionOxygen Delivery Method: Circle system utilized Preoxygenation: Pre-oxygenation with 100% oxygen Intubation Type: IV induction Ventilation: Mask ventilation without difficulty LMA: LMA inserted LMA Size: 5.0 Tube type: Oral Number of attempts: 1 Placement Confirmation: positive ETCO2 and breath sounds checked- equal and bilateral Tube secured with: Tape Dental Injury: Teeth and Oropharynx as per pre-operative assessment

## 2012-04-05 NOTE — Anesthesia Postprocedure Evaluation (Signed)
  Anesthesia Post-op Note  Patient: Andre Jackson  Procedure(s) Performed: Procedure(s) (LRB): TOTAL KNEE ARTHROPLASTY (Right)  Patient Location: PACU  Anesthesia Type: General and GA combined with regional for post-op pain  Level of Consciousness: awake, alert  and oriented  Airway and Oxygen Therapy: Patient Spontanous Breathing and Patient connected to nasal cannula oxygen  Post-op Pain: mild  Post-op Assessment: Post-op Vital signs reviewed and Patient's Cardiovascular Status Stable  Post-op Vital Signs: stable  Complications: No apparent anesthesia complications

## 2012-04-05 NOTE — Progress Notes (Signed)
UR COMPLETED  

## 2012-04-05 NOTE — Interval H&P Note (Signed)
History and Physical Interval Note:  04/05/2012 9:03 AM  Andre Jackson  has presented today for surgery, with the diagnosis of DJD RIGHT KNEE  The various methods of treatment have been discussed with the patient and family. After consideration of risks, benefits and other options for treatment, the patient has consented to  Procedure(s) (LRB): TOTAL KNEE ARTHROPLASTY (Right) as a surgical intervention .  The patient's history has been reviewed, patient examined, no change in status, stable for surgery.  I have reviewed the patients' chart and labs.  Questions were answered to the patient's satisfaction.     Salvatore Marvel A

## 2012-04-06 ENCOUNTER — Other Ambulatory Visit (HOSPITAL_COMMUNITY): Payer: Medicare Other

## 2012-04-06 LAB — CBC
HCT: 33.3 % — ABNORMAL LOW (ref 39.0–52.0)
Hemoglobin: 11.1 g/dL — ABNORMAL LOW (ref 13.0–17.0)
MCH: 30.1 pg (ref 26.0–34.0)
RBC: 3.69 MIL/uL — ABNORMAL LOW (ref 4.22–5.81)

## 2012-04-06 LAB — BASIC METABOLIC PANEL
BUN: 16 mg/dL (ref 6–23)
CO2: 29 mEq/L (ref 19–32)
Calcium: 8.3 mg/dL — ABNORMAL LOW (ref 8.4–10.5)
Glucose, Bld: 122 mg/dL — ABNORMAL HIGH (ref 70–99)
Sodium: 137 mEq/L (ref 135–145)

## 2012-04-06 MED ORDER — SODIUM CHLORIDE 0.9 % IV BOLUS (SEPSIS)
500.0000 mL | Freq: Once | INTRAVENOUS | Status: AC
  Administered 2012-04-06: 500 mL via INTRAVENOUS

## 2012-04-06 NOTE — Evaluation (Signed)
Occupational Therapy Evaluation Patient Details Name: Andre Jackson MRN: [id number] DOB: [birth year] Today's Date: 04/06/2012 Time: 4098-1191 OT Time Calculation (min): 14 min  OT Assessment / Plan / Recommendation Clinical Impression  Pt presents to OT at [facility] with ADL activity. No further OT needs. Family will provide A as needed    OT Assessment  Patient does not need any further OT services    Follow Up Recommendations  No OT follow up       Equipment Recommendations  None recommended by OT          Precautions / Restrictions Precautions Precautions: Knee Precaution Booklet Issued: No Required Braces or Orthoses: Knee Immobilizer - Right Knee Immobilizer - Right: Discontinue post op day 2 Restrictions Weight Bearing Restrictions: Yes RLE Weight Bearing: Weight bearing as tolerated       ADL  Grooming: Simulated;Supervision/safety Where Assessed - Grooming: Supported standing Upper Body Bathing: Simulated;Set up Where Assessed - Upper Body Bathing: Rolling right and/or left Lower Body Bathing: Simulated;Set up Where Assessed - Lower Body Bathing: Supported sit to stand Upper Body Dressing: Performed;Supervision/safety Where Assessed - Upper Body Dressing: Unsupported sitting Lower Body Dressing: Performed;Minimal assistance Where Assessed - Lower Body Dressing: Sopported sit to stand Toilet Transfer: Performed;Supervision/safety Toilet Transfer Method: Sit to Barista: Regular height toilet;Grab bars Toileting - Clothing Manipulation and Hygiene: Performed;Supervision/safety Where Assessed - Glass blower/designer Manipulation and Hygiene: Standing        Visit Information  Last OT Received On: 04/06/12 Assistance Needed: +1       Prior Functioning  Home Living Lives With: Spouse Available Help at Discharge: Family Type of Home: House Home Access: Stairs to enter Secretary/administrator of Steps: 1 Entrance Stairs-Rails:  None Home Layout: One level Bathroom Shower/Tub: Forensic scientist: Standard Bathroom Accessibility: No Home Adaptive Equipment: Bedside commode/3-in-1;Walker - rolling;Crutches;Shower chair with back Prior Function Level of Independence: Independent Able to Take Stairs?: Yes Driving: Yes Vocation: Retired Musician: No difficulties    Cognition  Overall Cognitive Status: Appears within functional limits for tasks assessed/performed Arousal/Alertness: Awake/alert Orientation Level: Appears intact for tasks assessed Behavior During Session: [facility] for tasks performed    Extremity/Trunk Assessment Right Upper Extremity Assessment RUE ROM/Strength/Tone: WFL for tasks assessed RUE Sensation: WFL - Light Touch RUE Coordination: WFL - gross/fine motor Left Upper Extremity Assessment LUE ROM/Strength/Tone: WFL for tasks assessed LUE Sensation: WFL - Light Touch LUE Coordination: WFL - gross/fine motor Right Lower Extremity Assessment RLE ROM/Strength/Tone: Deficits RLE ROM/Strength/Tone Deficits: 2/5 throughout.  AA/ROM 0-77 degrees. RLE Sensation: WFL - Light Touch RLE Coordination: WFL - gross/fine motor Left Lower Extremity Assessment LLE ROM/Strength/Tone: Within functional levels LLE Sensation: WFL - Light Touch LLE Coordination: WFL - gross/fine motor Trunk Assessment Trunk Assessment: Normal   Mobility Bed Mobility Bed Mobility: Not assessed Supine to Sit: 4: Min assist;HOB flat Details for Bed Mobility Assistance: Assist for right LE due to pain with cues for sequence. Transfers Transfers: Sit to Stand;Stand to Sit Sit to Stand: 5: Supervision;With upper extremity assist;From chair/3-in-1 Stand to Sit: 5: Supervision;With upper extremity assist;To chair/3-in-1;To toilet Details for Transfer Assistance: Assist and guarding for balance with cues for safest hand/right LE placement.   Exercise Total Joint Exercises Ankle  Circles/Pumps: AROM;Right;10 reps;Supine Quad Sets: AROM;Right;10 reps;Supine Short Arc Quad: AROM;Right;10 reps;Supine Heel Slides: AAROM;Right;10 reps;Supine Hip ABduction/ADduction: AROM;Right;10 reps;Supine Straight Leg Raises: AAROM;Right;10 reps;Supine  Balance Balance Balance Assessed: No  End of Session OT - End of Session Activity  Tolerance: Patient tolerated treatment well Patient left: in chair;with call bell/phone within reach;with family/visitor present CPM Right Knee CPM Right Knee: Off   Arlethia Basso, Metro Kung 04/06/2012, 11:25 AM

## 2012-04-06 NOTE — Progress Notes (Signed)
CARE MANAGEMENT NOTE 04/06/2012  Patient:  Andre Jackson, Andre Jackson   Account Number:  [id number]  Date Initiated:  04/06/2012  Documentation initiated by:  Vance Peper  Subjective/Objective Assessment:   66 yr old male s/p right total knee arthroplasty     Action/Plan:   CM spoke with patient regarding home health needs.Choice offered,  Patient preoperatively setup with [facility], no changes. Patient has rolling walker and 3in1. Waiting for CPM to be delivered. Has family support at discharge.   Anticipated DC Date:  04/07/2012   Anticipated DC Plan:  HOME W HOME HEALTH SERVICES      DC Planning Services  CM consult      [facility] Choice  HOME HEALTH   Choice offered to / List presented to:  C-1 Patient        HH arranged  HH-2 PT      [facility] agency  [facility]   Status of service:  Completed, signed off  Discharge Disposition:  HOME W HOME HEALTH SERVICES

## 2012-04-06 NOTE — Progress Notes (Signed)
Patient ID: Andre Jackson, male   DOB: 20-Feb-1946, 66 y.o.   MRN: [id number] PATIENT ID: Andre Jackson  MRN: [id number]  DOB/AGE:  Nov 19, 1945 / 66 y.o.  1 Day Post-Op Procedure(s) (LRB): TOTAL KNEE ARTHROPLASTY (Right)    PROGRESS NOTE Subjective: Patient is alert, oriented, no Nausea, no Vomiting, yes passing gas, no Bowel Movement. Taking PO well. Denies SOB, Chest or Calf Pain. Using Incentive Spirometer, PAS in place. Ambulate not yet, CPM 0-90 Patient reports pain as 5 on 0-10 scale  .    Objective: Vital signs in last 24 hours: Filed Vitals:   04/05/12 2000 04/05/12 2119 04/06/12 0000 04/06/12 0557  BP:  122/73  109/59  Pulse:  84  81  Temp:  97.1 F (36.2 C)  99.5 F (37.5 C)  TempSrc:      Resp: 16 18 16 18   SpO2: 100% 96% 100% 99%      Intake/Output from previous day: I/O last 3 completed shifts: In: 1880 [P.O.:480; I.V.:1400] Out: 1600 [Urine:1000; Drains:550; Blood:50]   Intake/Output this shift:     LABORATORY DATA:  Basename 04/06/12 0540  WBC 8.8  HGB 11.1*  HCT 33.3*  PLT 177  NA 137  K 3.5  CL 101  CO2 29  BUN 16  CREATININE 1.02  GLUCOSE 122*  GLUCAP --  INR --  CALCIUM 8.3*    Examination: Neurologically intact ABD soft Neurovascular intact Sensation intact distally Intact pulses distally Dorsiflexion/Plantar flexion intact Incision: dressing C/D/I}  Assessment:   1 Day Post-Op Procedure(s) (LRB): TOTAL KNEE ARTHROPLASTY (Right) ADDITIONAL DIAGNOSIS:  Acute Blood Loss Anemia and Hypertension  Plan: PT/OT WBAT, CPM 6/hrs day until ROM 0-90 degrees,  DVT Prophylaxis:  SCDx72hrs,Lovenox DISCHARGE PLAN: Home DISCHARGE NEEDS: HHPT, CPM and Walker Fluid bolus times 2 and then saline lock IV, dressing change tonight.     Moataz Tavis J 04/06/2012, 7:37 AM

## 2012-04-06 NOTE — Evaluation (Signed)
Physical Therapy Evaluation Patient Details Name: Andre Jackson MRN: [id number] DOB: [birth year] Today's Date: 04/06/2012 Time: 6962-9528 PT Time Calculation (min): 23 min  PT Assessment / Plan / Recommendation Clinical Impression  Pt is a 66 y/o male admitted s/p right TKA along with the below PT problem list.  Pt would benefit from acute PT to maximize independence and facilitate d/c home with HHPT.    PT Assessment  Patient needs continued PT services    Follow Up Recommendations  Home health PT    Barriers to Discharge None      lEquipment Recommendations  None recommended by PT    Recommendations for Other Services     Frequency 7X/week    Precautions / Restrictions Precautions Precautions: Knee Precaution Booklet Issued: No Required Braces or Orthoses: Knee Immobilizer - Right Knee Immobilizer - Right: Discontinue post op day 2 Restrictions Weight Bearing Restrictions: Yes RLE Weight Bearing: Weight bearing as tolerated   Pertinent Vitals/Pain 7/10 in right knee.  Pt repositioned.      Mobility  Bed Mobility Bed Mobility: Supine to Sit Supine to Sit: 4: Min assist;HOB flat Details for Bed Mobility Assistance: Assist for right LE due to pain with cues for sequence. Transfers Transfers: Sit to Stand;Stand to Sit Sit to Stand: 4: Min assist;With upper extremity assist;From bed Stand to Sit: 4: Min assist;With upper extremity assist;To chair/3-in-1 Details for Transfer Assistance: Assist for balance with cues for safest hand/right LE placement. Ambulation/Gait Ambulation/Gait Assistance: 4: Min assist Ambulation Distance (Feet): 216 Feet Assistive device: Rolling walker Ambulation/Gait Assistance Details: Assist for balance with cues for tall posture and safe sequence including initial contact on right heel with gait. Gait Pattern: Step-to pattern;Decreased step length - right;Decreased stance time - right;Trunk flexed Stairs: No Wheelchair  Mobility Wheelchair Mobility: No    Exercises Total Joint Exercises Ankle Circles/Pumps: AROM;Right;10 reps;Supine Quad Sets: AROM;Right;10 reps;Supine Heel Slides: AAROM;Right;10 reps;Supine   PT Diagnosis: Difficulty walking;Acute pain  PT Problem List: Decreased strength;Decreased range of motion;Decreased activity tolerance;Decreased balance;Decreased mobility;Decreased knowledge of use of DME;Pain PT Treatment Interventions: DME instruction;Gait training;Stair training;Functional mobility training;Therapeutic activities;Therapeutic exercise;Balance training;Patient/family education   PT Goals Acute Rehab PT Goals PT Goal Formulation: With patient/family Time For Goal Achievement: 04/13/12 Potential to Achieve Goals: Good Pt will go Supine/Side to Sit: with modified independence PT Goal: Supine/Side to Sit - Progress: Goal set today Pt will go Sit to Supine/Side: with modified independence PT Goal: Sit to Supine/Side - Progress: Goal set today Pt will go Sit to Stand: with modified independence PT Goal: Sit to Stand - Progress: Goal set today Pt will go Stand to Sit: with modified independence PT Goal: Stand to Sit - Progress: Goal set today Pt will Ambulate: >150 feet;with modified independence;with least restrictive assistive device PT Goal: Ambulate - Progress: Goal set today Pt will Go Up / Down Stairs: 1-2 stairs;with modified independence;with least restrictive assistive device PT Goal: Up/Down Stairs - Progress: Goal set today Pt will Perform Home Exercise Program: Independently PT Goal: Perform Home Exercise Program - Progress: Goal set today  Visit Information  Last PT Received On: 04/06/12 Assistance Needed: +1    Subjective Data  Subjective: "It's just sore." Patient Stated Goal: Go home.   Prior Functioning  Home Living Lives With: Spouse Available Help at Discharge: Family Type of Home: House Home Access: Stairs to enter Secretary/administrator of Steps:  1 Entrance Stairs-Rails: None Home Layout: One level Bathroom Shower/Tub: Forensic scientist: Standard Bathroom Accessibility: No  Home Adaptive Equipment: Bedside commode/3-in-1;Walker - rolling;Crutches;Shower chair with back Prior Function Level of Independence: Independent Able to Take Stairs?: Yes Driving: Yes Vocation: Retired Musician: No difficulties    Cognition  Overall Cognitive Status: Appears within functional limits for tasks assessed/performed Arousal/Alertness: Awake/alert Orientation Level: Appears intact for tasks assessed Behavior During Session: [facility] for tasks performed    Extremity/Trunk Assessment Right Upper Extremity Assessment RUE ROM/Strength/Tone: Within functional levels RUE Sensation: WFL - Light Touch RUE Coordination: WFL - gross/fine motor Left Upper Extremity Assessment LUE ROM/Strength/Tone: Within functional levels LUE Sensation: WFL - Light Touch LUE Coordination: WFL - gross/fine motor Right Lower Extremity Assessment RLE ROM/Strength/Tone: Deficits RLE ROM/Strength/Tone Deficits: 2/5 throughout.  AA/ROM 0-77 degrees. RLE Sensation: WFL - Light Touch RLE Coordination: WFL - gross/fine motor Left Lower Extremity Assessment LLE ROM/Strength/Tone: Within functional levels LLE Sensation: WFL - Light Touch LLE Coordination: WFL - gross/fine motor Trunk Assessment Trunk Assessment: Normal   Balance Balance Balance Assessed: No  End of Session PT - End of Session Equipment Utilized During Treatment: Gait belt;Right knee immobilizer Activity Tolerance: Patient tolerated treatment well Patient left: in chair;with call bell/phone within reach;with family/visitor present Nurse Communication: Mobility status CPM Right Knee CPM Right Knee: Off   Cephus Shelling 04/06/2012, 9:12 AM  04/06/2012 Cephus Shelling, PT, DPT [phone]

## 2012-04-06 NOTE — Progress Notes (Signed)
PT Treatment Note:   04/06/12 1119  PT Visit Information  Last PT Received On 04/06/12  Assistance Needed +1  PT Time Calculation  PT Start Time 1054  PT Stop Time 1106  PT Time Calculation (min) 12 min  Subjective Data  Subjective "I feel great!"  Patient Stated Goal Go home.  Precautions  Precautions Knee  Precaution Booklet Issued No  Required Braces or Orthoses Knee Immobilizer - Right  Knee Immobilizer - Right Discontinue post op day 2  Restrictions  Weight Bearing Restrictions Yes  RLE Weight Bearing WBAT  Cognition  Overall Cognitive Status Appears within functional limits for tasks assessed/performed  Arousal/Alertness Awake/alert  Orientation Level Appears intact for tasks assessed  Behavior During Session [facility] for tasks performed  Bed Mobility  Bed Mobility Not assessed  Transfers  Transfers Sit to Stand;Stand to Sit  Sit to Stand 4: Min assist;With upper extremity assist;From chair/3-in-1  Stand to Sit 4: Min guard;With upper extremity assist;To chair/3-in-1  Details for Transfer Assistance Assist and guarding for balance with cues for safest hand/right LE placement.  Ambulation/Gait  Ambulation/Gait Assistance 4: Min guard  Ambulation Distance (Feet) 230 Feet  Assistive device Rolling walker  Ambulation/Gait Assistance Details Guarding for balance with cues for step-through sequence.  Gait Pattern Step-through pattern;Decreased step length - right;Decreased stance time - right  Stairs Yes  Stairs Assistance 4: Min guard  Stair Management Technique Step to pattern;Backwards;With walker  Number of Stairs 1  (1 step x 2 trials.)  Wheelchair Mobility  Wheelchair Mobility No  Balance  Balance Assessed No  Exercises  Exercises Total Joint  Total Joint Exercises  Ankle Circles/Pumps AROM;Right;10 reps;Supine  Quad Sets AROM;Right;10 reps;Supine  Heel Slides AAROM;Right;10 reps;Supine  Short Arc Quad AROM;Right;10 reps;Supine  Hip ABduction/ADduction  AROM;Right;10 reps;Supine  Straight Leg Raises AAROM;Right;10 reps;Supine  PT - End of Session  Equipment Utilized During Treatment Gait belt;Right knee immobilizer  Activity Tolerance Patient tolerated treatment well  Patient left in chair;with call bell/phone within reach  Nurse Communication Mobility status  PT - Assessment/Plan  Comments on Treatment Session Pt admitted s/p right TKA and continues to progress being able to increase ambulation distance/independence as well as negotiate stairs this treatment.  Will follow.  PT Plan Discharge plan remains appropriate;Frequency remains appropriate  PT Frequency 7X/week  Follow Up Recommendations Home health PT  Equipment Recommended None recommended by PT  Acute Rehab PT Goals  PT Goal Formulation With patient/family  Time For Goal Achievement 04/13/12  Potential to Achieve Goals Good  PT Goal: Sit to Stand - Progress Progressing toward goal  PT Goal: Stand to Sit - Progress Progressing toward goal  PT Goal: Ambulate - Progress Progressing toward goal  PT Goal: Up/Down Stairs - Progress Progressing toward goal  PT Goal: Perform Home Exercise Program - Progress Progressing toward goal  PT General Charges  $$ ACUTE PT VISIT 1 Procedure  PT Treatments  $Gait Training 8-22 mins    Pain:  5/10 in right knee.  Pt repositioned and premedicated.  04/06/2012 Cephus Shelling, PT, DPT [phone]

## 2012-04-07 LAB — CBC
Hemoglobin: 10.2 g/dL — ABNORMAL LOW (ref 13.0–17.0)
MCH: 29.3 pg (ref 26.0–34.0)
MCV: 88.8 fL (ref 78.0–100.0)
RBC: 3.48 MIL/uL — ABNORMAL LOW (ref 4.22–5.81)

## 2012-04-07 LAB — BASIC METABOLIC PANEL
CO2: 27 mEq/L (ref 19–32)
Calcium: 8.7 mg/dL (ref 8.4–10.5)
Creatinine, Ser: 1 mg/dL (ref 0.50–1.35)
Glucose, Bld: 118 mg/dL — ABNORMAL HIGH (ref 70–99)

## 2012-04-07 MED ORDER — CELECOXIB 200 MG PO CAPS: 200.0000 mg | ORAL_CAPSULE | Freq: Every day | ORAL | Status: AC

## 2012-04-07 MED ORDER — OXYCODONE HCL 5 MG PO TABS: 5.0000 mg | ORAL_TABLET | Freq: Four times a day (QID) | ORAL | Status: AC | PRN

## 2012-04-07 MED ORDER — DSS 100 MG PO CAPS: 100.0000 mg | ORAL_CAPSULE | Freq: Two times a day (BID) | ORAL | Status: AC

## 2012-04-07 MED ORDER — METOCLOPRAMIDE HCL 5 MG/ML IJ SOLN: 5.0000 mg | Freq: Three times a day (TID) | INTRAMUSCULAR | Status: DC

## 2012-04-07 MED ORDER — BISACODYL 10 MG RE SUPP
10.0000 mg | Freq: Once | RECTAL | Status: AC
  Administered 2012-04-07: 10 mg via RECTAL
  Filled 2012-04-07: qty 1

## 2012-04-07 MED ORDER — METOCLOPRAMIDE HCL 10 MG PO TABS: 10.0000 mg | ORAL_TABLET | Freq: Three times a day (TID) | ORAL | Status: DC

## 2012-04-07 MED ORDER — POTASSIUM CHLORIDE CRYS ER 20 MEQ PO TBCR
40.0000 meq | EXTENDED_RELEASE_TABLET | Freq: Two times a day (BID) | ORAL | Status: DC
  Administered 2012-04-07: 40 meq via ORAL
  Filled 2012-04-07: qty 2

## 2012-04-07 MED ORDER — ENOXAPARIN SODIUM 30 MG/0.3ML ~~LOC~~ SOLN: 30.0000 mg | Freq: Two times a day (BID) | SUBCUTANEOUS | Status: DC

## 2012-04-07 MED ORDER — POTASSIUM CHLORIDE CRYS ER 20 MEQ PO TBCR: 20.0000 meq | EXTENDED_RELEASE_TABLET | Freq: Every day | ORAL | Status: DC

## 2012-04-07 NOTE — Discharge Summary (Signed)
Patient ID: Andre Jackson MRN: [id number] DOB/AGE: 1946/01/17 66 y.o.  Admit date: 04/05/2012 Discharge date: 04/07/2012  Admission Diagnoses:  Principal Problem:  *Right knee DJD Active Problems:  Hypertension  Arthritis   Discharge Diagnoses:  Same  Past Medical History  Diagnosis Date  . Hyperlipidemia     takes Lipitor daily  . Hypertension     takes Prinizide daily  . Joint pain   . Joint swelling   . Seasonal allergies     takes Allegra daily  . Diverticulosis   . Hx of colonic polyps   . Cataract immature   . Arthritis   . Right knee DJD     Surgeries: Procedure(s): TOTAL KNEE ARTHROPLASTY on 04/05/2012   Consultants:  none  Discharged Condition: Improved  Hospital Course: Andre Jackson is an 66 y.o. male who was admitted 04/05/2012 for operative treatment ofRight knee DJD. Patient has severe unremitting pain that affects sleep, daily activities, and work/hobbies. After pre-op clearance the patient was taken to the operating room on 04/05/2012 and underwent  Procedure(s): TOTAL KNEE ARTHROPLASTY.    Patient was given perioperative antibiotics: Anti-infectives     Start     Dose/Rate Route Frequency Ordered Stop   04/05/12 1400   ceFAZolin (ANCEF) IVPB 2 g/50 mL premix        2 g 100 mL/hr over 30 Minutes Intravenous Every 6 hours 04/05/12 1327 04/05/12 2232   04/05/12 1042   cefUROXime (ZINACEF) injection  Status:  Discontinued          As needed 04/05/12 1042 04/05/12 1129   04/04/12 0812   ceFAZolin (ANCEF) IVPB 2 g/50 mL premix        2 g 100 mL/hr over 30 Minutes Intravenous 60 min pre-op 04/04/12 4098 04/05/12 1191           Patient was given sequential compression devices, early ambulation, and chemoprophylaxis to prevent DVT.  Patient benefited maximally from hospital stay and there were no complications.    Recent vital signs: Patient Vitals for the past 24 hrs:  BP Temp Pulse Resp SpO2 Height Weight  04/07/12 0603 135/73 mmHg 99.1  F (37.3 C) 98  18  95 % - -  04-13-12 2226 127/67 mmHg 100.4 F (38 C) 84  18  98 % - -  04/13/2012 1600 - - - 16  99 % - -  2012-04-13 1444 132/64 mmHg 99.8 F (37.7 C) 93  16  92 % - -  04/13/12 1121 - - - - - 5\' 7"  (1.702 m) 81.647 kg (180 lb)  13-Apr-2012 0800 - - - 18  98 % - -     Recent laboratory studies:  [facility] 04/07/12 0509 04-13-12 0540  WBC 9.4 8.8  HGB 10.2* 11.1*  HCT 30.9* 33.3*  PLT 165 177  NA 136 137  K 3.3* 3.5  CL 100 101  CO2 27 29  BUN 13 16  CREATININE 1.00 1.02  GLUCOSE 118* 122*  INR -- --  CALCIUM 8.7 --     Discharge Medications:   Medication List  As of 04/07/2012  7:59 AM   STOP taking these medications         aspirin 81 MG tablet      meloxicam 15 MG tablet         TAKE these medications         atorvastatin 10 MG tablet   Commonly known as: LIPITOR   Take 10 mg by mouth  daily.      celecoxib 200 MG capsule   Commonly known as: CELEBREX   Take 1 capsule (200 mg total) by mouth daily.      DSS 100 MG Caps   Take 100 mg by mouth 2 (two) times daily.      enoxaparin 30 MG/0.3ML injection   Commonly known as: LOVENOX   Inject 0.3 mLs (30 mg total) into the skin every 12 (twelve) hours.      fexofenadine 180 MG tablet   Commonly known as: ALLEGRA   Take 180 mg by mouth daily.      lisinopril-hydrochlorothiazide 20-25 MG per tablet   Commonly known as: PRINZIDE,ZESTORETIC   Take 1 tablet by mouth daily.      multivitamin with minerals Tabs   Take 1 tablet by mouth daily.      oxyCODONE 5 MG immediate release tablet   Commonly known as: Oxy IR/ROXICODONE   Take 1 tablet (5 mg total) by mouth every 6 (six) hours as needed.      potassium chloride SA 20 MEQ tablet   Commonly known as: K-DUR,KLOR-CON   Take 1 tablet (20 mEq total) by mouth daily.            Diagnostic Studies: Dg Chest 2 View  04/05/2012  *RADIOLOGY REPORT*  Clinical Data: Preop for knee replacement.  CHEST - 2 VIEW  Comparison: None.  Findings: Normal  heart size and pulmonary vascularity.  Tortuous aorta.  Emphysematous changes in the lungs.  No focal airspace consolidation.  No blunting of costophrenic angles.  No pneumothorax.  Degenerative changes in the spine and shoulders.  IMPRESSION: Emphysematous changes in the lungs.  No evidence of active infiltration.  Original Report Authenticated By: Marlon Pel, M.D.    Disposition: Final discharge disposition not confirmed  Discharge Orders    Future Orders Please Complete By Expires   Diet - low sodium heart healthy      Call MD / Call 911      Comments:   If you experience chest pain or shortness of breath, CALL 911 and be transported to the hospital emergency room.  If you develope a fever above 101 F, pus (white drainage) or increased drainage or redness at the wound, or calf pain, call your surgeon's office.   Constipation Prevention      Comments:   Drink plenty of fluids.  Prune juice may be helpful.  You may use a stool softener, such as Colace (over the counter) 100 mg twice a day.  Use MiraLax (over the counter) for constipation as needed.   Increase activity slowly as tolerated      CPM      Comments:   Continuous passive motion machine (CPM):      Use the CPM from 0 to 90 for 6 hours per day.       You may break it up into 2 or 3 sessions per day.      Use CPM for 2 weeks or until you are told to stop.   TED hose      Comments:   Use stockings (TED hose) for 2 weeks on both leg(s).  You may remove them at night for sleeping.   Change dressing      Comments:   Use stockings (TED hose) for 2 weeks on both leg(s).  You may remove them at night for sleeping.   Do not put a pillow under the knee. Place it under the heel.  Comments:   Place yellow foam block, yellow side up under heel at all times except when in CPM or when walking.  DO NOT modify, tear, cut, or change in any way the yellow foam block.      Follow-up Information    Follow up with Nilda Simmer,  MD on 04/20/2012. (appt 9:15)    Contact information:   [facility] [address], [address] [place] [address] [phone]           Signed: Pascal Lux 04/07/2012, 7:59 AM

## 2012-04-07 NOTE — Progress Notes (Signed)
Order received to discharge patient to home. Reviewed all discharge instructions with patient - medications, appointments, when to call 911/MD, diet, and precautions/restrictions related to surgery. Patient verbalized understanding of discharge instructions and stated he did not have any other questions. IV removed from patient.  

## 2012-04-07 NOTE — Progress Notes (Signed)
Physical Therapy Treatment Patient Details Name: Andre Jackson MRN: [id number] DOB: [birth year] Today's Date: 04/07/2012 Time: 7829-5621 PT Time Calculation (min): 19 min  PT Assessment / Plan / Recommendation Comments on Treatment Session  Pt admitted s/p right TKA and was able to increase ambulation distance/independence today as well as stair negotiation independence.  Pt ready for safe d/c home once medically cleared by MD.    Follow Up Recommendations  Home health PT    Barriers to Discharge        Equipment Recommendations  None recommended by PT    Recommendations for Other Services    Frequency 7X/week   Plan Discharge plan remains appropriate;Frequency remains appropriate    Precautions / Restrictions Precautions Precautions: Knee Precaution Booklet Issued: No Required Braces or Orthoses: Knee Immobilizer - Right Knee Immobilizer - Right: Discontinue post op day 2 Restrictions Weight Bearing Restrictions: Yes RLE Weight Bearing: Weight bearing as tolerated   Pertinent Vitals/Pain N/A    Mobility  Bed Mobility Bed Mobility: Supine to Sit;Sit to Supine Supine to Sit: 6: Modified independent (Device/Increase time);HOB flat Sit to Supine: 6: Modified independent (Device/Increase time);HOB flat Transfers Transfers: Sit to Stand;Stand to Sit Sit to Stand: 6: Modified independent (Device/Increase time);With upper extremity assist;From bed Stand to Sit: 6: Modified independent (Device/Increase time);With upper extremity assist;To bed Ambulation/Gait Ambulation/Gait Assistance: 5: Supervision Ambulation Distance (Feet): 450 Feet Assistive device: Rolling walker Ambulation/Gait Assistance Details: Verbal cues for tall posture and continued step-through sequence with initial contact on right heel. Gait Pattern: Step-through pattern;Trunk flexed Stairs: Yes Stairs Assistance: 5: Supervision Stairs Assistance Details (indicate cue type and reason): Verbal cues for  safest sequence. Stair Management Technique: Step to pattern;Backwards;With walker Number of Stairs: 1  Wheelchair Mobility Wheelchair Mobility: No    Exercises Total Joint Exercises Ankle Circles/Pumps: AROM;Right;10 reps;Supine Quad Sets: AROM;Right;10 reps;Supine Short Arc Quad: AROM;Right;10 reps;Supine Heel Slides: AROM;Right;10 reps;Supine Hip ABduction/ADduction: AROM;Right;10 reps;Supine Straight Leg Raises: AROM;Right;10 reps;Supine Long Arc Quad: AROM;Right;10 reps;Seated Knee Flexion: AROM;Right;10 reps;Seated Goniometric ROM: AA/ROM right knee 0-95 degrees.   PT Diagnosis:    PT Problem List:   PT Treatment Interventions:     PT Goals Acute Rehab PT Goals PT Goal Formulation: With patient/family Time For Goal Achievement: 04/13/12 Potential to Achieve Goals: Good PT Goal: Supine/Side to Sit - Progress: Met PT Goal: Sit to Supine/Side - Progress: Met PT Goal: Sit to Stand - Progress: Met PT Goal: Stand to Sit - Progress: Met PT Goal: Ambulate - Progress: Progressing toward goal PT Goal: Up/Down Stairs - Progress: Progressing toward goal PT Goal: Perform Home Exercise Program - Progress: Progressing toward goal  Visit Information  Last PT Received On: 04/07/12 Assistance Needed: +1    Subjective Data  Subjective: "I am ready to get out of here." Patient Stated Goal: Go home.   Cognition  Overall Cognitive Status: Appears within functional limits for tasks assessed/performed Arousal/Alertness: Awake/alert Orientation Level: Appears intact for tasks assessed Behavior During Session: [facility] for tasks performed    Balance  Balance Balance Assessed: No  End of Session PT - End of Session Equipment Utilized During Treatment: Gait belt Activity Tolerance: Patient tolerated treatment well Patient left: in bed;with call bell/phone within reach;with family/visitor present Nurse Communication: Mobility status   GP     Cephus Shelling 04/07/2012, 10:36  AM  04/07/2012 Cephus Shelling, PT, DPT [phone]

## 2012-04-08 ENCOUNTER — Encounter (HOSPITAL_COMMUNITY): Payer: Self-pay | Admitting: Orthopedic Surgery

## 2012-07-21 ENCOUNTER — Other Ambulatory Visit: Payer: Self-pay | Admitting: Physician Assistant

## 2012-07-21 ENCOUNTER — Encounter: Payer: Self-pay | Admitting: Physician Assistant

## 2012-07-21 DIAGNOSIS — J302 Other seasonal allergic rhinitis: Secondary | ICD-10-CM

## 2012-07-21 DIAGNOSIS — E785 Hyperlipidemia, unspecified: Secondary | ICD-10-CM | POA: Insufficient documentation

## 2012-07-21 DIAGNOSIS — M2540 Effusion, unspecified joint: Secondary | ICD-10-CM | POA: Insufficient documentation

## 2012-07-21 DIAGNOSIS — M2550 Pain in unspecified joint: Secondary | ICD-10-CM

## 2012-07-21 DIAGNOSIS — Z860100 Personal history of colon polyps, unspecified: Secondary | ICD-10-CM | POA: Insufficient documentation

## 2012-07-21 DIAGNOSIS — Z96659 Presence of unspecified artificial knee joint: Secondary | ICD-10-CM

## 2012-07-21 DIAGNOSIS — K5790 Diverticulosis of intestine, part unspecified, without perforation or abscess without bleeding: Secondary | ICD-10-CM

## 2012-07-21 NOTE — H&P (Signed)
TOTAL KNEE ADMISSION H&P  Patient is being admitted for left total knee arthroplasty.  Subjective:  Chief Complaint:left knee pain.  HPI: Andre Jackson, 66 y.o. male, has a history of pain and functional disability in the left knee due to arthritis and has failed non-surgical conservative treatments for greater than 12 weeks to includeNSAID's and/or analgesics, corticosteriod injections, flexibility and strengthening excercises, use of assistive devices, weight reduction as appropriate and activity modification.  Onset of symptoms was gradual, starting >10 years ago with gradually worsening course since that time. The patient noted prior procedures on the knee to include  arthroscopy, menisectomy and history of infection in knee after a shot by Dr Alice Reichert.  2009 on the left knee(s).  Patient currently rates pain in the left knee(s) at 7 out of 10 with activity. Patient has worsening of pain with activity and weight bearing, pain that interferes with activities of daily living, crepitus and joint swelling.  Patient has evidence of subchondral sclerosis, periarticular osteophytes and joint space narrowing by imaging studies. . There is no signs of active infection.  Patient Active Problem List   Diagnosis Date Noted  . Hyperlipidemia   . Joint pain   . Joint swelling   . Seasonal allergies   . Diverticulosis   . Hx of colonic polyps   . S/P TKR (total knee replacement)   . Hypertension   . Arthritis   . Right knee DJD    Past Medical History  Diagnosis Date  . Hyperlipidemia     takes Lipitor daily  . Hypertension     takes Prinizide daily  . Joint pain   . Joint swelling   . Seasonal allergies     takes Allegra daily  . Diverticulosis   . Hx of colonic polyps   . Cataract immature   . Arthritis   . Left knee DJD   . S/P TKR (total knee replacement) 2013    right    Past Surgical History  Procedure Date  . Lipoma excision 07/2011    neck  . I&d extremity ~ 2010   left knee  . Replacement total knee 04/05/12    right  . Tonsillectomy and adenoidectomy     as a child  . Knee arthroscopy ~ 2011    left  . Multiple tooth extractions ~ 2007  . Back surgery   . Lumbar disc surgery 1970's  . Colonoscopy w/ biopsies and polypectomy 07/2011    "have had it once before that also"  . Total knee arthroplasty 04/05/2012    Procedure: TOTAL KNEE ARTHROPLASTY;  Surgeon: Nilda Simmer, MD;  Location: [facility] OR;  Service: Orthopedics;  Laterality: Right;  DR Anne Ng 90 MINUTES FOR THIS CASE    Current Outpatient Prescriptions on File Prior to Visit  Medication Sig Dispense Refill  . acetaminophen (TYLENOL) 500 MG tablet Take 500 mg by mouth every 6 (six) hours as needed. For pain.      Marland Kitchen atorvastatin (LIPITOR) 10 MG tablet Take 10 mg by mouth daily.      Marland Kitchen lisinopril-hydrochlorothiazide (PRINZIDE,ZESTORETIC) 20-25 MG per tablet Take 1 tablet by mouth daily.      . meloxicam (MOBIC) 15 MG tablet Take 15 mg by mouth daily.      Marland Kitchen aspirin EC 81 MG tablet Take 81 mg by mouth daily.      . fexofenadine (ALLEGRA) 180 MG tablet Take 180 mg by mouth daily. For allergies.        (  Not in a hospital admission) No Known Allergies  History  Substance Use Topics  . Smoking status: Former Smoker    Quit date: 02/10/2009  . Smokeless tobacco: Never Used  . Alcohol Use: 0.6 oz/week    1 Cans of beer per week    Family History  Problem Relation Age of Onset  . Cerebral aneurysm Mother   . Hypertension Sister   . Hypertension Mother   . Hypertension Brother      Review of Systems  Constitutional: Negative.   HENT: Negative.   Eyes: Negative.   Respiratory: Negative.   Cardiovascular: Negative.   Gastrointestinal: Negative.   Genitourinary: Negative.   Musculoskeletal: Positive for joint pain.       Left knee pain  Skin: Negative.   Neurological: Negative.   Endo/Heme/Allergies: Negative.   Psychiatric/Behavioral: Negative.     Objective:  Physical  Exam  Constitutional: He is oriented to person, place, and time. He appears well-developed and well-nourished.  HENT:  Head: Normocephalic and atraumatic.  Mouth/Throat: Oropharynx is clear and moist.  Eyes: Conjunctivae normal and EOM are normal. Pupils are equal, round, and reactive to light.  Neck: Neck supple.  Cardiovascular: Normal rate, regular rhythm and normal heart sounds.   Respiratory: Effort normal and breath sounds normal.  GI: Soft. Bowel sounds are normal.  Genitourinary:       Not pertinent to current symptomatology therefore not examined.  Musculoskeletal:       He has active range of motion 0-105 degrees in his right knee .  4+/5 strength.  He is neurovascularly intact.  Left knee has active range of motion -10 to 110 degrees.  3+ crepitus.  1+ synovitis.  Varus deformityMedial joint line tenderness.  Distal neurovascular exam is intact.    Neurological: He is alert and oriented to person, place, and time. He has normal reflexes.  Skin: Skin is warm and dry.  Psychiatric: He has a normal mood and affect. His behavior is normal. Judgment and thought content normal.    Vital signs in last 24 hours: 144/84 BP 97.8 Temp 98% O2 sat 70 Pulse  Labs:   Estimated Body mass index is 28.55 kg/(m^2) as calculated from the following:   Height as of this encounter: 5' 7.5"(1.715 m).   Weight as of this encounter: 185 lb(83.915 kg).   Imaging Review Plain radiographs demonstrate severe degenerative joint disease of the left knee(s). The overall alignment issignificant varus. The bone quality appears to be good for age and reported activity level.  Assessment/Plan:  End stage arthritis, left knee  Patient Active Problem List  Diagnosis  . Hypertension  . Arthritis  . Right knee DJD  . Hyperlipidemia  . Joint pain  . Joint swelling  . Seasonal allergies  . Diverticulosis  . Hx of colonic polyps  . S/P TKR (total knee replacement)    The patient history, physical  examination, clinical judgment of the provider and imaging studies are consistent with end stage degenerative joint disease of the left knee(s) and total knee arthroplasty is deemed medically necessary. The treatment options including medical management, injection therapy arthroscopy and arthroplasty were discussed at length. The risks and benefits of total knee arthroplasty were presented and reviewed. The risks due to aseptic loosening, infection, stiffness, patella tracking problems, thromboembolic complications and other imponderables were discussed. The patient acknowledged the explanation, agreed to proceed with the plan and consent was signed. Patient is being admitted for inpatient treatment for surgery, pain control, PT, OT,  prophylactic antibiotics, VTE prophylaxis, progressive ambulation and ADL's and discharge planning. The patient is planning to be discharged home with home health services

## 2012-07-26 ENCOUNTER — Encounter (HOSPITAL_COMMUNITY)
Admission: RE | Admit: 2012-07-26 | Discharge: 2012-07-26 | Disposition: A | Discharge status: Home / Self Care | Payer: Medicare Other | Source: Ambulatory Visit | Attending: Orthopedic Surgery | Admitting: Orthopedic Surgery

## 2012-07-26 ENCOUNTER — Encounter (HOSPITAL_COMMUNITY): Payer: Self-pay

## 2012-07-26 LAB — PROTIME-INR
INR: 0.94 (ref 0.00–1.49)
Prothrombin Time: 12.5 seconds (ref 11.6–15.2)

## 2012-07-26 LAB — COMPREHENSIVE METABOLIC PANEL
Albumin: 3.9 g/dL (ref 3.5–5.2)
BUN: 16 mg/dL (ref 6–23)
Calcium: 9.8 mg/dL (ref 8.4–10.5)
Chloride: 98 mEq/L (ref 96–112)
Creatinine, Ser: 1.23 mg/dL (ref 0.50–1.35)
GFR calc non Af Amer: 59 mL/min — ABNORMAL LOW (ref >90)
Total Bilirubin: 0.5 mg/dL (ref 0.3–1.2)

## 2012-07-26 LAB — TYPE AND SCREEN: ABO/RH(D): A POS

## 2012-07-26 LAB — CBC
HCT: 44.1 % (ref 39.0–52.0)
Hemoglobin: 14.7 g/dL (ref 13.0–17.0)
MCV: 86.5 fL (ref 78.0–100.0)

## 2012-07-26 LAB — URINALYSIS, ROUTINE W REFLEX MICROSCOPIC
Bilirubin Urine: NEGATIVE
Glucose, UA: NEGATIVE mg/dL
Ketones, ur: NEGATIVE mg/dL
Protein, ur: NEGATIVE mg/dL
pH: 7 (ref 5.0–8.0)

## 2012-07-26 LAB — SURGICAL PCR SCREEN: MRSA, PCR: NEGATIVE

## 2012-07-26 LAB — APTT: aPTT: 31 seconds (ref 24–37)

## 2012-07-26 MED ORDER — CHLORHEXIDINE GLUCONATE 4 % EX LIQD: 60.0000 mL | Freq: Once | CUTANEOUS | Status: DC

## 2012-07-26 NOTE — Pre-Procedure Instructions (Signed)
20 [name]  07/26/2012   Your procedure is scheduled on:  Monday August 02, 2012  Report to [facility] Stay [facility] at 9:15 AM.  Call this number if you have problems the morning of surgery: [phone]   Remember:   Do not eat food or drink :After Midnight.      Take these medicines the morning of surgery with A SIP OF WATER: tylenol, allegra,    Do not wear jewelry, make-up or nail polish.  Do not wear lotions, powders, or perfumes.  Do not shave 48 hours prior to surgery. Men may shave face and neck.  Do not bring valuables to the hospital.  Contacts, dentures or bridgework may not be worn into surgery.  Leave suitcase in the car. After surgery it may be brought to your room.  For patients admitted to the hospital, checkout time is 11:00 AM the day of discharge.   Patients discharged the day of surgery will not be allowed to drive home.  Name and phone number of your driver: family / friend  Special Instructions: Shower using CHG 2 nights before surgery and the night before surgery.  If you shower the day of surgery use CHG.  Use special wash - you have one bottle of CHG for all showers.  You should use approximately 1/3 of the bottle for each shower.   Please read over the following fact sheets that you were given: Pain Booklet, Coughing and Deep Breathing, Blood Transfusion Information, Total Joint Packet, MRSA Information and Surgical Site Infection Prevention

## 2012-07-27 LAB — URINE CULTURE: Colony Count: NO GROWTH

## 2012-07-30 NOTE — Progress Notes (Signed)
Left message for pt to arrive at 0700 and to follow previous instructions that were given

## 2012-07-30 NOTE — Progress Notes (Signed)
Mistyped telling pt 0700;pt instructed to arrive @ 0900--verbalized understanding

## 2012-08-01 MED ORDER — CEFAZOLIN SODIUM-DEXTROSE 2-3 GM-% IV SOLR
2.0000 g | INTRAVENOUS | Status: AC
  Administered 2012-08-02: 2 g via INTRAVENOUS
  Filled 2012-08-01: qty 50

## 2012-08-02 ENCOUNTER — Encounter (HOSPITAL_COMMUNITY): Payer: Self-pay

## 2012-08-02 ENCOUNTER — Inpatient Hospital Stay (HOSPITAL_COMMUNITY)
Admission: RE | Admit: 2012-08-02 | Discharge: 2012-08-03 | DRG: 470 | Disposition: A | Discharge status: Home / Self Care | Payer: Medicare Other | Source: Ambulatory Visit | Attending: Orthopedic Surgery | Admitting: Orthopedic Surgery

## 2012-08-02 ENCOUNTER — Ambulatory Visit (HOSPITAL_COMMUNITY): Payer: Medicare Other

## 2012-08-02 ENCOUNTER — Encounter (HOSPITAL_COMMUNITY): Payer: Self-pay | Admitting: *Deleted

## 2012-08-02 ENCOUNTER — Encounter (HOSPITAL_COMMUNITY)
Admission: RE | Disposition: A | Discharge status: Home / Self Care | Payer: Self-pay | Source: Ambulatory Visit | Attending: Orthopedic Surgery

## 2012-08-02 DIAGNOSIS — M1712 Unilateral primary osteoarthritis, left knee: Secondary | ICD-10-CM | POA: Diagnosis present

## 2012-08-02 DIAGNOSIS — K5790 Diverticulosis of intestine, part unspecified, without perforation or abscess without bleeding: Secondary | ICD-10-CM | POA: Diagnosis present

## 2012-08-02 DIAGNOSIS — Z8249 Family history of ischemic heart disease and other diseases of the circulatory system: Secondary | ICD-10-CM

## 2012-08-02 DIAGNOSIS — I10 Essential (primary) hypertension: Secondary | ICD-10-CM | POA: Diagnosis present

## 2012-08-02 DIAGNOSIS — M1990 Unspecified osteoarthritis, unspecified site: Secondary | ICD-10-CM | POA: Diagnosis present

## 2012-08-02 DIAGNOSIS — Z96659 Presence of unspecified artificial knee joint: Secondary | ICD-10-CM

## 2012-08-02 DIAGNOSIS — M1710 Unilateral primary osteoarthritis, unspecified knee: Principal | ICD-10-CM | POA: Diagnosis present

## 2012-08-02 DIAGNOSIS — M2540 Effusion, unspecified joint: Secondary | ICD-10-CM | POA: Diagnosis present

## 2012-08-02 DIAGNOSIS — Z87891 Personal history of nicotine dependence: Secondary | ICD-10-CM

## 2012-08-02 DIAGNOSIS — E785 Hyperlipidemia, unspecified: Secondary | ICD-10-CM | POA: Diagnosis present

## 2012-08-02 DIAGNOSIS — M2550 Pain in unspecified joint: Secondary | ICD-10-CM | POA: Diagnosis present

## 2012-08-02 DIAGNOSIS — Z7982 Long term (current) use of aspirin: Secondary | ICD-10-CM

## 2012-08-02 DIAGNOSIS — K5730 Diverticulosis of large intestine without perforation or abscess without bleeding: Secondary | ICD-10-CM | POA: Diagnosis present

## 2012-08-02 HISTORY — PX: TOTAL KNEE ARTHROPLASTY: SHX125

## 2012-08-02 SURGERY — ARTHROPLASTY, KNEE, TOTAL
Anesthesia: General | Site: Knee | Laterality: Left | Wound class: Clean

## 2012-08-02 MED ORDER — OXYCODONE HCL 5 MG PO TABS: 5.0000 mg | ORAL_TABLET | ORAL | Status: DC | PRN

## 2012-08-02 MED ORDER — LACTATED RINGERS IV SOLN
INTRAVENOUS | Status: DC | PRN
  Administered 2012-08-02 (×3): via INTRAVENOUS

## 2012-08-02 MED ORDER — LIDOCAINE HCL (CARDIAC) 20 MG/ML IV SOLN
INTRAVENOUS | Status: DC | PRN
  Administered 2012-08-02: 100 mg via INTRAVENOUS

## 2012-08-02 MED ORDER — PROPOFOL 10 MG/ML IV BOLUS
INTRAVENOUS | Status: DC | PRN
  Administered 2012-08-02: 200 mg via INTRAVENOUS
  Administered 2012-08-02: 50 mg via INTRAVENOUS

## 2012-08-02 MED ORDER — ONDANSETRON HCL 4 MG PO TABS: 4.0000 mg | ORAL_TABLET | Freq: Four times a day (QID) | ORAL | Status: DC | PRN

## 2012-08-02 MED ORDER — BUPIVACAINE-EPINEPHRINE PF 0.5-1:200000 % IJ SOLN
INTRAMUSCULAR | Status: DC | PRN
  Administered 2012-08-02: 25 mL

## 2012-08-02 MED ORDER — PHENOL 1.4 % MT LIQD: 1.0000 | OROMUCOSAL | Status: DC | PRN

## 2012-08-02 MED ORDER — CELECOXIB 200 MG PO CAPS
200.0000 mg | ORAL_CAPSULE | Freq: Two times a day (BID) | ORAL | Status: DC
  Administered 2012-08-02 – 2012-08-03 (×2): 200 mg via ORAL
  Filled 2012-08-02 (×3): qty 1

## 2012-08-02 MED ORDER — CEFUROXIME SODIUM 1.5 G IJ SOLR
INTRAMUSCULAR | Status: AC
  Filled 2012-08-02: qty 1.5

## 2012-08-02 MED ORDER — ACETAMINOPHEN 325 MG PO TABS: 650.0000 mg | ORAL_TABLET | Freq: Four times a day (QID) | ORAL | Status: DC | PRN

## 2012-08-02 MED ORDER — DEXAMETHASONE 6 MG PO TABS
10.0000 mg | ORAL_TABLET | Freq: Every day | ORAL | Status: DC
  Administered 2012-08-02 – 2012-08-03 (×2): 10 mg via ORAL
  Filled 2012-08-02 (×2): qty 1

## 2012-08-02 MED ORDER — POVIDONE-IODINE 7.5 % EX SOLN
Freq: Once | CUTANEOUS | Status: DC
  Filled 2012-08-02: qty 118

## 2012-08-02 MED ORDER — OXYCODONE HCL 5 MG/5ML PO SOLN: 5.0000 mg | Freq: Once | ORAL | Status: DC | PRN

## 2012-08-02 MED ORDER — OXYCODONE HCL 5 MG PO TABS: 5.0000 mg | ORAL_TABLET | Freq: Once | ORAL | Status: DC | PRN

## 2012-08-02 MED ORDER — CEFAZOLIN SODIUM-DEXTROSE 2-3 GM-% IV SOLR
2.0000 g | Freq: Four times a day (QID) | INTRAVENOUS | Status: AC
  Administered 2012-08-02 (×2): 2 g via INTRAVENOUS
  Filled 2012-08-02 (×2): qty 50

## 2012-08-02 MED ORDER — DOCUSATE SODIUM 100 MG PO CAPS
100.0000 mg | ORAL_CAPSULE | Freq: Two times a day (BID) | ORAL | Status: DC
  Administered 2012-08-02 – 2012-08-03 (×2): 100 mg via ORAL
  Filled 2012-08-02 (×3): qty 1

## 2012-08-02 MED ORDER — LACTATED RINGERS IV SOLN: INTRAVENOUS | Status: DC

## 2012-08-02 MED ORDER — ROCURONIUM BROMIDE 100 MG/10ML IV SOLN
INTRAVENOUS | Status: DC | PRN
  Administered 2012-08-02: 30 mg via INTRAVENOUS

## 2012-08-02 MED ORDER — ACETAMINOPHEN 650 MG RE SUPP: 650.0000 mg | Freq: Four times a day (QID) | RECTAL | Status: DC | PRN

## 2012-08-02 MED ORDER — METOCLOPRAMIDE HCL 5 MG/ML IJ SOLN: 10.0000 mg | Freq: Once | INTRAMUSCULAR | Status: DC | PRN

## 2012-08-02 MED ORDER — ATORVASTATIN CALCIUM 10 MG PO TABS
10.0000 mg | ORAL_TABLET | Freq: Every day | ORAL | Status: DC
  Administered 2012-08-02 – 2012-08-03 (×2): 10 mg via ORAL
  Filled 2012-08-02 (×2): qty 1

## 2012-08-02 MED ORDER — MENTHOL 3 MG MT LOZG: 1.0000 | LOZENGE | OROMUCOSAL | Status: DC | PRN

## 2012-08-02 MED ORDER — LISINOPRIL 20 MG PO TABS
20.0000 mg | ORAL_TABLET | Freq: Every day | ORAL | Status: DC
  Administered 2012-08-02 – 2012-08-03 (×2): 20 mg via ORAL
  Filled 2012-08-02 (×2): qty 1

## 2012-08-02 MED ORDER — POTASSIUM CHLORIDE IN NACL 20-0.9 MEQ/L-% IV SOLN
INTRAVENOUS | Status: DC
  Administered 2012-08-02: 100 mL/h via INTRAVENOUS
  Administered 2012-08-03: 04:00:00 via INTRAVENOUS
  Filled 2012-08-02 (×4): qty 1000

## 2012-08-02 MED ORDER — CEFUROXIME SODIUM 1.5 G IJ SOLR
INTRAMUSCULAR | Status: DC | PRN
  Administered 2012-08-02: 1.5 g

## 2012-08-02 MED ORDER — HYDROMORPHONE HCL PF 1 MG/ML IJ SOLN: 0.5000 mg | INTRAMUSCULAR | Status: DC | PRN

## 2012-08-02 MED ORDER — DEXAMETHASONE SODIUM PHOSPHATE 10 MG/ML IJ SOLN
10.0000 mg | Freq: Every day | INTRAMUSCULAR | Status: DC
  Filled 2012-08-02 (×2): qty 1

## 2012-08-02 MED ORDER — FENTANYL CITRATE 0.05 MG/ML IJ SOLN
INTRAMUSCULAR | Status: DC | PRN
  Administered 2012-08-02 (×4): 50 ug via INTRAVENOUS

## 2012-08-02 MED ORDER — METOCLOPRAMIDE HCL 5 MG/ML IJ SOLN: 5.0000 mg | Freq: Three times a day (TID) | INTRAMUSCULAR | Status: DC | PRN

## 2012-08-02 MED ORDER — PHENYLEPHRINE HCL 10 MG/ML IJ SOLN
INTRAMUSCULAR | Status: DC | PRN
  Administered 2012-08-02: 80 ug via INTRAVENOUS
  Administered 2012-08-02: 40 ug via INTRAVENOUS

## 2012-08-02 MED ORDER — ONDANSETRON HCL 4 MG/2ML IJ SOLN: 4.0000 mg | Freq: Four times a day (QID) | INTRAMUSCULAR | Status: DC | PRN

## 2012-08-02 MED ORDER — DIPHENHYDRAMINE HCL 12.5 MG/5ML PO ELIX: 12.5000 mg | ORAL_SOLUTION | ORAL | Status: DC | PRN

## 2012-08-02 MED ORDER — HYDROCHLOROTHIAZIDE 25 MG PO TABS
25.0000 mg | ORAL_TABLET | Freq: Every day | ORAL | Status: DC
  Administered 2012-08-02 – 2012-08-03 (×2): 25 mg via ORAL
  Filled 2012-08-02 (×2): qty 1

## 2012-08-02 MED ORDER — HYDROCODONE-ACETAMINOPHEN 5-325 MG PO TABS
1.0000 | ORAL_TABLET | ORAL | Status: DC | PRN
  Administered 2012-08-02 – 2012-08-03 (×2): 2 via ORAL
  Filled 2012-08-02: qty 2
  Filled 2012-08-02 (×2): qty 1

## 2012-08-02 MED ORDER — HYDROMORPHONE HCL PF 1 MG/ML IJ SOLN: 0.2500 mg | INTRAMUSCULAR | Status: DC | PRN

## 2012-08-02 MED ORDER — LISINOPRIL-HYDROCHLOROTHIAZIDE 20-25 MG PO TABS: 1.0000 | ORAL_TABLET | Freq: Every day | ORAL | Status: DC

## 2012-08-02 MED ORDER — GLYCOPYRROLATE 0.2 MG/ML IJ SOLN
INTRAMUSCULAR | Status: DC | PRN
  Administered 2012-08-02: 0.6 mg via INTRAVENOUS

## 2012-08-02 MED ORDER — BUPIVACAINE-EPINEPHRINE PF 0.25-1:200000 % IJ SOLN
INTRAMUSCULAR | Status: AC
  Filled 2012-08-02: qty 30

## 2012-08-02 MED ORDER — BISACODYL 5 MG PO TBEC
10.0000 mg | DELAYED_RELEASE_TABLET | Freq: Every day | ORAL | Status: DC
  Administered 2012-08-02: 10 mg via ORAL
  Filled 2012-08-02: qty 2

## 2012-08-02 MED ORDER — DEXAMETHASONE SODIUM PHOSPHATE 10 MG/ML IJ SOLN
INTRAMUSCULAR | Status: DC | PRN
  Administered 2012-08-02: 10 mg via INTRAVENOUS

## 2012-08-02 MED ORDER — SODIUM CHLORIDE 0.9 % IR SOLN
Status: DC | PRN
  Administered 2012-08-02: 3000 mL

## 2012-08-02 MED ORDER — ENOXAPARIN SODIUM 30 MG/0.3ML ~~LOC~~ SOLN
30.0000 mg | Freq: Two times a day (BID) | SUBCUTANEOUS | Status: DC
  Administered 2012-08-03: 30 mg via SUBCUTANEOUS
  Filled 2012-08-02 (×3): qty 0.3

## 2012-08-02 MED ORDER — METOCLOPRAMIDE HCL 10 MG PO TABS: 5.0000 mg | ORAL_TABLET | Freq: Three times a day (TID) | ORAL | Status: DC | PRN

## 2012-08-02 MED ORDER — BUPIVACAINE-EPINEPHRINE 0.25% -1:200000 IJ SOLN
INTRAMUSCULAR | Status: DC | PRN
  Administered 2012-08-02: 30 mL

## 2012-08-02 MED ORDER — ONDANSETRON HCL 4 MG/2ML IJ SOLN
INTRAMUSCULAR | Status: DC | PRN
  Administered 2012-08-02: 4 mg via INTRAVENOUS

## 2012-08-02 MED ORDER — LORATADINE 10 MG PO TABS
10.0000 mg | ORAL_TABLET | Freq: Every day | ORAL | Status: DC
  Administered 2012-08-02 – 2012-08-03 (×2): 10 mg via ORAL
  Filled 2012-08-02 (×2): qty 1

## 2012-08-02 MED ORDER — MIDAZOLAM HCL 5 MG/5ML IJ SOLN
INTRAMUSCULAR | Status: DC | PRN
  Administered 2012-08-02: 2 mg via INTRAVENOUS

## 2012-08-02 MED ORDER — NEOSTIGMINE METHYLSULFATE 1 MG/ML IJ SOLN
INTRAMUSCULAR | Status: DC | PRN
  Administered 2012-08-02: 4 mg via INTRAVENOUS

## 2012-08-02 SURGICAL SUPPLY — 67 items
BANDAGE ESMARK 6X9 LF (GAUZE/BANDAGES/DRESSINGS) ×1 IMPLANT
BLADE SAGITTAL 25.0X1.19X90 (BLADE) ×2 IMPLANT
BLADE SAW SGTL 11.0X1.19X90.0M (BLADE) IMPLANT
BLADE SAW SGTL 13.0X1.19X90.0M (BLADE) ×2 IMPLANT
BLADE SURG 10 STRL SS (BLADE) ×4 IMPLANT
BNDG ELASTIC 6X15 VLCR STRL LF (GAUZE/BANDAGES/DRESSINGS) ×2 IMPLANT
BNDG ESMARK 6X9 LF (GAUZE/BANDAGES/DRESSINGS) ×2
BOWL SMART MIX CTS (DISPOSABLE) ×2 IMPLANT
CEMENT HV SMART SET (Cement) ×4 IMPLANT
CLOTH BEACON ORANGE TIMEOUT ST (SAFETY) ×2 IMPLANT
COVER BACK TABLE 24X17X13 BIG (DRAPES) IMPLANT
COVER PROBE W GEL 5X96 (DRAPES) ×2 IMPLANT
COVER SURGICAL LIGHT HANDLE (MISCELLANEOUS) ×2 IMPLANT
CUFF TOURNIQUET SINGLE 34IN LL (TOURNIQUET CUFF) ×2 IMPLANT
CUFF TOURNIQUET SINGLE 44IN (TOURNIQUET CUFF) IMPLANT
DRAPE EXTREMITY T 121X128X90 (DRAPE) ×2 IMPLANT
DRAPE INCISE IOBAN 66X45 STRL (DRAPES) ×2 IMPLANT
DRAPE PROXIMA HALF (DRAPES) ×2 IMPLANT
DRAPE U-SHAPE 47X51 STRL (DRAPES) ×2 IMPLANT
DRSG ADAPTIC 3X8 NADH LF (GAUZE/BANDAGES/DRESSINGS) ×2 IMPLANT
DRSG PAD ABDOMINAL 8X10 ST (GAUZE/BANDAGES/DRESSINGS) ×4 IMPLANT
DURAPREP 26ML APPLICATOR (WOUND CARE) ×2 IMPLANT
ELECT CAUTERY BLADE 6.4 (BLADE) ×2 IMPLANT
ELECT REM PT RETURN 9FT ADLT (ELECTROSURGICAL) ×2
ELECTRODE REM PT RTRN 9FT ADLT (ELECTROSURGICAL) ×1 IMPLANT
EVACUATOR 1/8 PVC DRAIN (DRAIN) IMPLANT
FACESHIELD LNG OPTICON STERILE (SAFETY) ×2 IMPLANT
GLOVE BIO SURGEON STRL SZ7 (GLOVE) ×2 IMPLANT
GLOVE BIOGEL PI IND STRL 7.0 (GLOVE) ×1 IMPLANT
GLOVE BIOGEL PI IND STRL 7.5 (GLOVE) ×1 IMPLANT
GLOVE BIOGEL PI INDICATOR 7.0 (GLOVE) ×1
GLOVE BIOGEL PI INDICATOR 7.5 (GLOVE) ×1
GLOVE SS BIOGEL STRL SZ 7.5 (GLOVE) ×3 IMPLANT
GLOVE SUPERSENSE BIOGEL SZ 7.5 (GLOVE) ×3
GOWN PREVENTION PLUS XLARGE (GOWN DISPOSABLE) ×4 IMPLANT
GOWN STRL NON-REIN LRG LVL3 (GOWN DISPOSABLE) ×4 IMPLANT
HANDPIECE INTERPULSE COAX TIP (DISPOSABLE) ×1
HOOD PEEL AWAY FACE SHEILD DIS (HOOD) ×4 IMPLANT
IMMOBILIZER KNEE 22 UNIV (SOFTGOODS) IMPLANT
INSERT CUSHION PRONEVIEW LG (MISCELLANEOUS) ×2 IMPLANT
KIT BASIN OR (CUSTOM PROCEDURE TRAY) ×2 IMPLANT
KIT ROOM TURNOVER OR (KITS) ×2 IMPLANT
MANIFOLD NEPTUNE II (INSTRUMENTS) ×2 IMPLANT
NS IRRIG 1000ML POUR BTL (IV SOLUTION) ×2 IMPLANT
PACK TOTAL JOINT (CUSTOM PROCEDURE TRAY) ×2 IMPLANT
PAD ARMBOARD 7.5X6 YLW CONV (MISCELLANEOUS) ×4 IMPLANT
PAD CAST 4YDX4 CTTN HI CHSV (CAST SUPPLIES) ×1 IMPLANT
PADDING CAST COTTON 4X4 STRL (CAST SUPPLIES) ×1
PADDING CAST COTTON 6X4 STRL (CAST SUPPLIES) ×2 IMPLANT
POSITIONER HEAD PRONE TRACH (MISCELLANEOUS) ×2 IMPLANT
RUBBERBAND STERILE (MISCELLANEOUS) ×2 IMPLANT
SET HNDPC FAN SPRY TIP SCT (DISPOSABLE) ×1 IMPLANT
SPONGE GAUZE 4X4 12PLY (GAUZE/BANDAGES/DRESSINGS) ×2 IMPLANT
STRIP CLOSURE SKIN 1/2X4 (GAUZE/BANDAGES/DRESSINGS) ×2 IMPLANT
SUCTION FRAZIER TIP 10 FR DISP (SUCTIONS) ×2 IMPLANT
SUT ETHIBOND NAB CT1 #1 30IN (SUTURE) ×4 IMPLANT
SUT MNCRL AB 3-0 PS2 18 (SUTURE) ×2 IMPLANT
SUT VIC AB 0 CT1 27 (SUTURE) ×2
SUT VIC AB 0 CT1 27XBRD ANBCTR (SUTURE) ×2 IMPLANT
SUT VIC AB 2-0 CT1 27 (SUTURE) ×2
SUT VIC AB 2-0 CT1 TAPERPNT 27 (SUTURE) ×2 IMPLANT
SUT VLOC 180 0 24IN GS25 (SUTURE) IMPLANT
SYR 30ML SLIP (SYRINGE) ×2 IMPLANT
TOWEL OR 17X24 6PK STRL BLUE (TOWEL DISPOSABLE) ×2 IMPLANT
TOWEL OR 17X26 10 PK STRL BLUE (TOWEL DISPOSABLE) ×2 IMPLANT
TRAY FOLEY CATH 14FR (SET/KITS/TRAYS/PACK) ×2 IMPLANT
WATER STERILE IRR 1000ML POUR (IV SOLUTION) ×4 IMPLANT

## 2012-08-02 NOTE — Op Note (Signed)
MRN:     [id number] DOB/AGE:    [birth year] / 66 y.o.       OPERATIVE REPORT    DATE OF PROCEDURE:  08/02/2012       PREOPERATIVE DIAGNOSIS:   DJD LEFT KNEE      Estimated Body mass index is 27.41 kg/(m^2) as calculated from the following:   Height as of 04/01/12: 5\' 8" (1.727 m).   Weight as of 04/01/12: 180 lb 4 oz(81.761 kg).                                                        POSTOPERATIVE DIAGNOSIS:   degnerative joint disease left knee                                                                      PROCEDURE:  Procedure(s): TOTAL KNEE ARTHROPLASTY Using Depuy Sigma RP implants #4 Femur, #5Tibia, 12.47mm sigma RP bearing, 35 Patella     SURGEON: Iysha Mishkin A    ASSISTANT:  Kirstin Shepperson PA-C   (Present and scrubbed throughout the case, critical for assistance with exposure, retraction, instrumentation, and closure.)         ANESTHESIA: GET with Femoral Nerve Block  DRAINS: foley, 2 medium hemovac in knee   TOURNIQUET TIME:   COMPLICATIONS:  None     SPECIMENS: None   INDICATIONS FOR PROCEDURE: The patient has  DJD LEFT KNEE, varus deformities, XR shows bone on bone arthritis. Patient has failed all conservative measures including anti-inflammatory medicines, narcotics, attempts at  exercise and weight loss, cortisone injections and viscosupplementation.  Risks and benefits of surgery have been discussed, questions answered.   DESCRIPTION OF PROCEDURE: The patient identified by armband, received  right femoral nerve block and IV antibiotics, in the holding area at [facility]. Patient taken to the operating room, appropriate anesthetic  monitors were attached General endotracheal anesthesia induced with  the patient in supine position, Foley catheter was inserted. Tourniquet  applied high to the operative thigh. Lateral post and foot positioner  applied to the table, the lower extremity was then prepped and draped  in usual sterile fashion from the  ankle to the tourniquet. Time-out procedure was performed. The limb was wrapped with an Esmarch bandage and the tourniquet inflated to 365 mmHg. We began the operation by making the anterior midline incision starting at handbreadth above the patella going over the patella 1 cm medial to and  4 cm distal to the tibial tubercle. Small bleeders in the skin and the  subcutaneous tissue identified and cauterized. Transverse retinaculum was incised and reflected medially and a medial parapatellar arthrotomy was accomplished. the patella was everted and theprepatellar fat pad resected. The superficial medial collateral  ligament was then elevated from anterior to posterior along the proximal  flare of the tibia and anterior half of the menisci resected. The knee was hyperflexed exposing bone on bone arthritis. Peripheral and notch osteophytes as well as the cruciate ligaments were then resected. We continued to  work our way around posteriorly along the proximal tibia, and externally  rotated the tibia subluxing it out from underneath the femur. A McHale  retractor was placed through the notch and a lateral Hohmann retractor  placed, and we then drilled through the proximal tibia in line with the  axis of the tibia followed by an intramedullary guide rod and 2-degree  posterior slope cutting guide. The tibial cutting guide was pinned into place  allowing resection of 4 mm of bone medially and about 7 mm of bone  laterally because of her varus deformity. Satisfied with the tibial resection, we then  entered the distal femur 2 mm anterior to the PCL origin with the  intramedullary guide rod and applied the distal femoral cutting guide  set at 11mm, with 5 degrees of valgus. This was pinned along the  epicondylar axis. At this point, the distal femoral cut was accomplished without difficulty. We then sized for a #4 femoral component and pinned the guide in 3 degrees of external rotation.The chamfer cutting  guide was pinned into place. The anterior, posterior, and chamfer cuts were accomplished without difficulty followed by  the Sigma RP box cutting guide and the box cut. We also removed posterior osteophytes from the posterior femoral condyles. At this  time, the knee was brought into full extension. We checked our  extension and flexion gaps and found them symmetric at 10mm.  The patella thickness measured at 27 mm. We set the cutting guide at 17 and removed the posterior 9.5-10 mm  of the patella sized for 35 button and drilled the lollipop. The knee  was then once again hyperflexed exposing the proximal tibia. We sized for a #5 tibial base plate, applied the smokestack and the conical reamer followed by the the Delta fin keel punch. We then hammered into place the Sigma RP trial femoral component, inserted a 12.5-mm trial bearing, trial patellar button, and took the knee through range of motion from 0-130 degrees. No thumb pressure was required for patellar  tracking. At this point, all trial components were removed, a double batch of DePuy HV cement with 1500 mg of Zinacef was mixed and applied to all bony metallic mating surfaces except for the posterior condyles of the femur itself. In order, we  hammered into place the tibial tray and removed excess cement, the femoral component and removed excess cement, a 12.5-mm Sigma RP bearing  was inserted, and the knee brought to full extension with compression.  The patellar button was clamped into place, and excess cement  removed. While the cement cured the wound was irrigated out with normal saline solution pulse lavage, and medium Hemovac drains were placed.. Ligament stability and patellar tracking were checked and found to be excellent. The tourniquet was then released and hemostasis was obtained with cautery. The parapatellar arthrotomy was closed with  #1 ethibond suture. The subcutaneous tissue with 0 and 2-0 undyed  Vicryl suture, and 4-0  Monocryl.. A dressing of Xeroform,  4 x 4, dressing sponges, Webril, and Ace wrap applied. Needle and sponge count were correct times 2.The patient awakened, extubated, and taken to recovery room without difficulty. Vascular status was normal, pulses 2+ and symmetric.   Andre Jackson A 08/02/2012, 12:59 PM

## 2012-08-02 NOTE — Progress Notes (Signed)
Orthopedic Tech Progress Note Patient Details:  Andre Jackson [birth year] [id number]  Patient ID: Andre Jackson, male   DOB: October 28, 1945, 65 y.o.   MRN: [id number] Viewed order from rn order list  Nikki Dom 08/02/2012, 2:33 PM

## 2012-08-02 NOTE — Progress Notes (Signed)
Orthopedic Tech Progress Note Patient Details:  Andre Jackson 1945-11-04 [id number]  CPM Left Knee CPM Left Knee: On Left Knee Flexion (Degrees): 60  Left Knee Extension (Degrees): 0  Additional Comments: trapeze bar patirnt helper   Nikki Dom 08/02/2012, 2:33 PM

## 2012-08-02 NOTE — Anesthesia Preprocedure Evaluation (Addendum)
Anesthesia Evaluation  Patient identified by MRN, date of birth, ID band Patient awake    Reviewed: Allergy & Precautions, H&P , NPO status , Patient's Chart, lab work & pertinent test results, reviewed documented beta blocker date and time   Airway Mallampati: I TM Distance: >3 FB Neck ROM: full    Dental  (+) Teeth Intact and Partial Upper   Pulmonary neg pulmonary ROS,  breath sounds clear to auscultation        Cardiovascular hypertension, Rhythm:regular     Neuro/Psych negative neurological ROS  negative psych ROS   GI/Hepatic negative GI ROS, Neg liver ROS,   Endo/Other  negative endocrine ROS  Renal/GU negative Renal ROS  negative genitourinary   Musculoskeletal   Abdominal   Peds  Hematology negative hematology ROS (+)   Anesthesia Other Findings See surgeon's H&P   Reproductive/Obstetrics negative OB ROS                          Anesthesia Physical Anesthesia Plan  ASA: II  Anesthesia Plan: General   Post-op Pain Management:    Induction:   Airway Management Planned: LMA and Oral ETT  Additional Equipment:   Intra-op Plan:   Post-operative Plan: Extubation in OR  Informed Consent: I have reviewed the patients History and Physical, chart, labs and discussed the procedure including the risks, benefits and alternatives for the proposed anesthesia with the patient or authorized representative who has indicated his/her understanding and acceptance.   Dental Advisory Given  Plan Discussed with: CRNA and Surgeon  Anesthesia Plan Comments:         Anesthesia Quick Evaluation

## 2012-08-02 NOTE — Preoperative (Signed)
Beta Blockers   Reason not to administer Beta Blockers:Not Applicable 

## 2012-08-02 NOTE — Anesthesia Postprocedure Evaluation (Signed)
Anesthesia Post Note  Patient: Andre Jackson  Procedure(s) Performed: Procedure(s) (LRB): TOTAL KNEE ARTHROPLASTY (Left)  Anesthesia type: general  Patient location: PACU  Post pain: Pain level controlled  Post assessment: Patient's Cardiovascular Status Stable  Last Vitals:  Filed Vitals:   08/02/12 1350  BP:   Pulse: 87  Temp:   Resp: 13    Post vital signs: Reviewed and stable  Level of consciousness: sedated  Complications: No apparent anesthesia complications

## 2012-08-02 NOTE — Progress Notes (Signed)
Report to K. Nevil RN as primary caregiver. 

## 2012-08-02 NOTE — H&P (View-Only) (Signed)
TOTAL KNEE ADMISSION H&P  Patient is being admitted for left total knee arthroplasty.  Subjective:  Chief Complaint:left knee pain.  HPI: Andre Jackson, 66 y.o. male, has a history of pain and functional disability in the left knee due to arthritis and has failed non-surgical conservative treatments for greater than 12 weeks to includeNSAID's and/or analgesics, corticosteriod injections, flexibility and strengthening excercises, use of assistive devices, weight reduction as appropriate and activity modification.  Onset of symptoms was gradual, starting >10 years ago with gradually worsening course since that time. The patient noted prior procedures on the knee to include  arthroscopy, menisectomy and history of infection in knee after a shot by Dr Kernoodle.  2009 on the left knee(s).  Patient currently rates pain in the left knee(s) at 7 out of 10 with activity. Patient has worsening of pain with activity and weight bearing, pain that interferes with activities of daily living, crepitus and joint swelling.  Patient has evidence of subchondral sclerosis, periarticular osteophytes and joint space narrowing by imaging studies. . There is no signs of active infection.  Patient Active Problem List   Diagnosis Date Noted  . Hyperlipidemia   . Joint pain   . Joint swelling   . Seasonal allergies   . Diverticulosis   . Hx of colonic polyps   . S/P TKR (total knee replacement)   . Hypertension   . Arthritis   . Right knee DJD    Past Medical History  Diagnosis Date  . Hyperlipidemia     takes Lipitor daily  . Hypertension     takes Prinizide daily  . Joint pain   . Joint swelling   . Seasonal allergies     takes Allegra daily  . Diverticulosis   . Hx of colonic polyps   . Cataract immature   . Arthritis   . Left knee DJD   . S/P TKR (total knee replacement) 2013    right    Past Surgical History  Procedure Date  . Lipoma excision 07/2011    neck  . I&d extremity ~ 2010   left knee  . Replacement total knee 04/05/12    right  . Tonsillectomy and adenoidectomy     as a child  . Knee arthroscopy ~ 2011    left  . Multiple tooth extractions ~ 2007  . Back surgery   . Lumbar disc surgery 1970's  . Colonoscopy w/ biopsies and polypectomy 07/2011    "have had it once before that also"  . Total knee arthroplasty 04/05/2012    Procedure: TOTAL KNEE ARTHROPLASTY;  Surgeon: Robert A Wainer, MD;  Location: [facility] OR;  Service: Orthopedics;  Laterality: Right;  DR WAINER WANTS 90 MINUTES FOR THIS CASE    Current Outpatient Prescriptions on File Prior to Visit  Medication Sig Dispense Refill  . acetaminophen (TYLENOL) 500 MG tablet Take 500 mg by mouth every 6 (six) hours as needed. For pain.      . atorvastatin (LIPITOR) 10 MG tablet Take 10 mg by mouth daily.      . lisinopril-hydrochlorothiazide (PRINZIDE,ZESTORETIC) 20-25 MG per tablet Take 1 tablet by mouth daily.      . meloxicam (MOBIC) 15 MG tablet Take 15 mg by mouth daily.      . aspirin EC 81 MG tablet Take 81 mg by mouth daily.      . fexofenadine (ALLEGRA) 180 MG tablet Take 180 mg by mouth daily. For allergies.        (  Not in a hospital admission) No Known Allergies  History  Substance Use Topics  . Smoking status: Former Smoker    Quit date: 02/10/2009  . Smokeless tobacco: Never Used  . Alcohol Use: 0.6 oz/week    1 Cans of beer per week    Family History  Problem Relation Age of Onset  . Cerebral aneurysm Mother   . Hypertension Sister   . Hypertension Mother   . Hypertension Brother      Review of Systems  Constitutional: Negative.   HENT: Negative.   Eyes: Negative.   Respiratory: Negative.   Cardiovascular: Negative.   Gastrointestinal: Negative.   Genitourinary: Negative.   Musculoskeletal: Positive for joint pain.       Left knee pain  Skin: Negative.   Neurological: Negative.   Endo/Heme/Allergies: Negative.   Psychiatric/Behavioral: Negative.     Objective:  Physical  Exam  Constitutional: He is oriented to person, place, and time. He appears well-developed and well-nourished.  HENT:  Head: Normocephalic and atraumatic.  Mouth/Throat: Oropharynx is clear and moist.  Eyes: Conjunctivae normal and EOM are normal. Pupils are equal, round, and reactive to light.  Neck: Neck supple.  Cardiovascular: Normal rate, regular rhythm and normal heart sounds.   Respiratory: Effort normal and breath sounds normal.  GI: Soft. Bowel sounds are normal.  Genitourinary:       Not pertinent to current symptomatology therefore not examined.  Musculoskeletal:       He has active range of motion 0-105 degrees in his right knee .  4+/5 strength.  He is neurovascularly intact.  Left knee has active range of motion -10 to 110 degrees.  3+ crepitus.  1+ synovitis.  Varus deformityMedial joint line tenderness.  Distal neurovascular exam is intact.    Neurological: He is alert and oriented to person, place, and time. He has normal reflexes.  Skin: Skin is warm and dry.  Psychiatric: He has a normal mood and affect. His behavior is normal. Judgment and thought content normal.    Vital signs in last 24 hours: 144/84 BP 97.8 Temp 98% O2 sat 70 Pulse  Labs:   Estimated Body mass index is 28.55 kg/(m^2) as calculated from the following:   Height as of this encounter: 5' 7.5"(1.715 m).   Weight as of this encounter: 185 lb(83.915 kg).   Imaging Review Plain radiographs demonstrate severe degenerative joint disease of the left knee(s). The overall alignment issignificant varus. The bone quality appears to be good for age and reported activity level.  Assessment/Plan:  End stage arthritis, left knee  Patient Active Problem List  Diagnosis  . Hypertension  . Arthritis  . Right knee DJD  . Hyperlipidemia  . Joint pain  . Joint swelling  . Seasonal allergies  . Diverticulosis  . Hx of colonic polyps  . S/P TKR (total knee replacement)    The patient history, physical  examination, clinical judgment of the provider and imaging studies are consistent with end stage degenerative joint disease of the left knee(s) and total knee arthroplasty is deemed medically necessary. The treatment options including medical management, injection therapy arthroscopy and arthroplasty were discussed at length. The risks and benefits of total knee arthroplasty were presented and reviewed. The risks due to aseptic loosening, infection, stiffness, patella tracking problems, thromboembolic complications and other imponderables were discussed. The patient acknowledged the explanation, agreed to proceed with the plan and consent was signed. Patient is being admitted for inpatient treatment for surgery, pain control, PT, OT,   prophylactic antibiotics, VTE prophylaxis, progressive ambulation and ADL's and discharge planning. The patient is planning to be discharged home with home health services   

## 2012-08-02 NOTE — Interval H&P Note (Signed)
History and Physical Interval Note:  08/02/2012 11:12 AM  Andre Jackson  has presented today for surgery, with the diagnosis of DJD LEFT KNEE  The various methods of treatment have been discussed with the patient and family. After consideration of risks, benefits and other options for treatment, the patient has consented to  Procedure(s) (LRB) with comments: TOTAL KNEE ARTHROPLASTY (Left) as a surgical intervention .  The patient's history has been reviewed, patient examined, no change in status, stable for surgery.  I have reviewed the patient's chart and labs.  Questions were answered to the patient's satisfaction.     Salvatore Marvel A

## 2012-08-02 NOTE — Evaluation (Signed)
Physical Therapy Evaluation Patient Details Name: Andre Jackson MRN: [id number] DOB: Nov 07, 1945 Today's Date: 08/02/2012 Time: 0865-7846 PT Time Calculation (min): 33 min  PT Assessment / Plan / Recommendation Clinical Impression  Pt is a 66 y/o male s/p L TKA.  Pt had right TKA in June of this year and is familiar with the rehab process. Pt is an excellent candidate to d/c home tomorrow after two sessions of PT.  Acute PT to follow pt to maximize mobility for d/c home.      PT Assessment  Patient needs continued PT services    Follow Up Recommendations  Home health PT;Supervision - Intermittent    Does the patient have the potential to tolerate intense rehabilitation      Barriers to Discharge None      Equipment Recommendations  None recommended by PT (Pt has all required equipment. )    Recommendations for Other Services     Frequency 7X/week    Precautions / Restrictions Precautions Precautions: Knee Precaution Comments: Educated pt in use of CPM, KI and WBAT on L Knee.   Required Braces or Orthoses: Knee Immobilizer - Left Knee Immobilizer - Left: On except when in CPM Restrictions Weight Bearing Restrictions: Yes LLE Weight Bearing: Weight bearing as tolerated    Pertinent Vitals/Pain Pt reporting pain in knee 1-2/10 with activity.  No need for pain intervention per pt.   AAROM in knee 12 degrees short of full extension to 86 degrees of flexion in supine.       Mobility  Bed Mobility Bed Mobility: Supine to Sit;Sitting - Scoot to Edge of Bed Supine to Sit: 5: Supervision Sitting - Scoot to Edge of Bed: 5: Supervision Details for Bed Mobility Assistance: Supervision for safety pt able to manage L LE without assistance.  Transfers Transfers: Sit to Stand;Stand to Sit Sit to Stand: 5: Supervision;With upper extremity assist;From bed Stand to Sit: 5: Supervision;With upper extremity assist;With armrests;To chair/3-in-1 Details for Transfer Assistance: Cues  for hand and L LE placement.  Ambulation/Gait Ambulation/Gait Assistance: 5: Supervision Ambulation Distance (Feet): 30 Feet Assistive device: Rolling walker Ambulation/Gait Assistance Details: Supervision for safety. Cues for WBAT on  L LE.  Gait Pattern: Step-to pattern Stairs: No    Shoulder Instructions     Exercises Total Joint Exercises Ankle Circles/Pumps: Both;AROM;10 reps;Seated   PT Diagnosis: Abnormality of gait;Generalized weakness;Acute pain  PT Problem List: Decreased strength;Decreased mobility;Other (comment);Decreased range of motion PT Treatment Interventions: DME instruction;Gait training;Stair training;Functional mobility training;Therapeutic activities;Therapeutic exercise;Patient/family education   PT Goals Acute Rehab PT Goals PT Goal Formulation: With patient Time For Goal Achievement: 08/09/12 Potential to Achieve Goals: Good Pt will go Supine/Side to Sit: Independently PT Goal: Supine/Side to Sit - Progress: Goal set today Pt will go Sit to Supine/Side: Independently PT Goal: Sit to Supine/Side - Progress: Goal set today Pt will Transfer Bed to Chair/Chair to Bed: with modified independence PT Transfer Goal: Bed to Chair/Chair to Bed - Progress: Goal set today Pt will Ambulate: >150 feet;with modified independence;with rolling walker PT Goal: Ambulate - Progress: Goal set today Pt will Go Up / Down Stairs: 1-2 stairs;with supervision;with rolling walker PT Goal: Up/Down Stairs - Progress: Goal set today Pt will Perform Home Exercise Program: Independently PT Goal: Perform Home Exercise Program - Progress: Goal set today  Visit Information  Last PT Received On: 08/02/12 Assistance Needed: +1    Subjective Data  Subjective: Agree to PT Eval. Patient Stated Goal: Return to home   Prior  Functioning  Home Living Lives With: Spouse Available Help at Discharge: Family;Available 24 hours/day Type of Home: House Home Access: Stairs to enter [employer] of Steps: 2 Entrance Stairs-Rails: None Home Layout: One level Bathroom Shower/Tub: Tub/shower unit Home Adaptive Equipment: Bedside commode/3-in-1;Walker - rolling;Straight cane Prior Function Level of Independence: Independent Able to Take Stairs?: Yes Driving: Yes Vocation: Retired Musician: No difficulties    Cognition  Overall Cognitive Status: Appears within functional limits for tasks assessed/performed Arousal/Alertness: Awake/alert Orientation Level: Appears intact for tasks assessed Behavior During Session: [facility] for tasks performed    Extremity/Trunk Assessment Right Upper Extremity Assessment RUE ROM/Strength/Tone: Within functional levels Left Upper Extremity Assessment LUE ROM/Strength/Tone: Within functional levels Right Lower Extremity Assessment RLE ROM/Strength/Tone: Within functional levels Left Lower Extremity Assessment LLE ROM/Strength/Tone: Deficits;Due to pain LLE ROM/Strength/Tone Deficits: Strength and ROM in knee limited secondary to surgery Trunk Assessment Trunk Assessment: Normal   Balance    End of Session PT - End of Session Equipment Utilized During Treatment: Gait belt;Left knee immobilizer Activity Tolerance: Patient tolerated treatment well Patient left: in chair;with call bell/phone within reach Nurse Communication: Mobility status CPM Left Knee CPM Left Knee: Off Additional Comments: CPM was set for -10 degrees of extension upon PT arrival.  Reset CPM to 0 degrees.  Pt does present with extensor lag.    GP     Andre Jackson 08/02/2012, 7:58 PM  Andre Jackson L. Yichen Gilardi DPT [phone]

## 2012-08-02 NOTE — Anesthesia Procedure Notes (Signed)
Anesthesia Regional Block:  Femoral nerve block  Pre-Anesthetic Checklist: ,, timeout performed, Correct Patient, Correct Site, Correct Laterality, Correct Procedure, Correct Position, site marked, Risks and benefits discussed,  Surgical consent,  Pre-op evaluation,  At surgeon's request and post-op pain management  Laterality: Left  Prep: chloraprep       Needles:   Needle Type: Other     Needle Length: 9cm  Needle Gauge: 21    Additional Needles:  Procedures: ultrasound guided Femoral nerve block Narrative:  Start time: 08/02/2012 10:13 AM End time: 08/02/2012 10:20 AM Injection made incrementally with aspirations every 5 mL.  Performed by: Personally  Anesthesiologist: C. Bri Wakeman MD  Additional Notes: Ultrasound guidance used to: id relevant anatomy, confirm needle position, local anesthetic spread, avoidance of vascular puncture. Picture saved. No complications. Block performed personally by Janetta Hora. Gelene Mink, MD    Femoral nerve block

## 2012-08-02 NOTE — Progress Notes (Signed)
Care of pt assumed by MA Aliea Bobe RN 

## 2012-08-02 NOTE — Transfer of Care (Signed)
Immediate Anesthesia Transfer of Care Note  Patient: Andre Jackson  Procedure(s) Performed: Procedure(s) (LRB) with comments: TOTAL KNEE ARTHROPLASTY (Left)  Patient Location: PACU  Anesthesia Type: General and GA combined with regional for post-op pain  Level of Consciousness: awake, alert  and oriented  Airway & Oxygen Therapy: Patient connected to face mask oxygen  Post-op Assessment: Report given to PACU RN and Post -op Vital signs reviewed and stable  Post vital signs: Reviewed and stable  Complications: No apparent anesthesia complications

## 2012-08-02 NOTE — Progress Notes (Signed)
Utilization review completed. Pervis Macintyre, RN, BSN. 

## 2012-08-03 ENCOUNTER — Encounter (HOSPITAL_COMMUNITY): Payer: Self-pay | Admitting: Orthopedic Surgery

## 2012-08-03 LAB — CBC
MCH: 28.5 pg (ref 26.0–34.0)
MCHC: 33.4 g/dL (ref 30.0–36.0)
MCV: 85.2 fL (ref 78.0–100.0)
Platelets: 192 10*3/uL (ref 150–400)

## 2012-08-03 LAB — BASIC METABOLIC PANEL
Calcium: 8.7 mg/dL (ref 8.4–10.5)
Creatinine, Ser: 0.9 mg/dL (ref 0.50–1.35)
GFR calc non Af Amer: 85 mL/min — ABNORMAL LOW (ref >90)
Glucose, Bld: 148 mg/dL — ABNORMAL HIGH (ref 70–99)
Sodium: 136 mEq/L (ref 135–145)

## 2012-08-03 MED ORDER — ENOXAPARIN SODIUM 30 MG/0.3ML ~~LOC~~ SOLN: 30.0000 mg | Freq: Two times a day (BID) | SUBCUTANEOUS | Status: DC

## 2012-08-03 MED ORDER — CELECOXIB 200 MG PO CAPS: 200.0000 mg | ORAL_CAPSULE | Freq: Every day | ORAL | Status: DC

## 2012-08-03 MED ORDER — HYDROCODONE-ACETAMINOPHEN 5-325 MG PO TABS: 1.0000 | ORAL_TABLET | ORAL | Status: DC | PRN

## 2012-08-03 MED ORDER — DSS 100 MG PO CAPS: ORAL_CAPSULE | ORAL | Status: DC

## 2012-08-03 MED ORDER — BISACODYL 5 MG PO TBEC: DELAYED_RELEASE_TABLET | ORAL | Status: DC

## 2012-08-03 NOTE — Progress Notes (Signed)
08/03/12 0900  PT Visit Information  Last PT Received On 08/03/12  Assistance Needed +1  PT Time Calculation  PT Start Time 0910  PT Stop Time 0943  PT Time Calculation (min) 33 min  Precautions  Precautions Knee  Restrictions  Weight Bearing Restrictions Yes  LLE Weight Bearing WBAT  Cognition  Overall Cognitive Status Appears within functional limits for tasks assessed/performed  Arousal/Alertness Awake/alert  Orientation Level Appears intact for tasks assessed  Behavior During Session [facility] for tasks performed  Bed Mobility  Bed Mobility Supine to Sit;Sit to Supine  Supine to Sit 7: Independent  Sit to Supine 7: Independent  Transfers  Transfers Sit to Stand;Stand to Sit  Sit to Stand 6: Modified independent (Device/Increase time);With upper extremity assist;From bed  Stand to Sit 6: Modified independent (Device/Increase time);With upper extremity assist;To bed  Ambulation/Gait  Ambulation/Gait Assistance 5: Supervision  Ambulation Distance (Feet) 200 Feet  Assistive device Rolling walker  Ambulation/Gait Assistance Details Supervision for safety  Gait Pattern Step-through pattern  Stairs Yes  Stairs Assistance 5: Supervision  Stairs Assistance Details (indicate cue type and reason) supervision for safety  Stair Management Technique Backwards;With walker  Number of Stairs 2   Exercises  Exercises Total Joint  Total Joint Exercises  Ankle Circles/Pumps AROM;Both;20 reps;Supine  Quad Sets AROM;Both;20 reps;Supine  Towel Squeeze AROM;Both;20 reps;Supine  Short Arc Quad AROM;Both;20 reps;Supine  Heel Slides AROM;Left;20 reps;Supine  Hip ABduction/ADduction AROM;Both;20 reps;Supine  Straight Leg Raises AROM;Both;20 reps;Supine  PT - End of Session  Equipment Utilized During Treatment Gait belt  Activity Tolerance Patient tolerated treatment well  Patient left in bed;with call bell/phone within reach;with family/visitor present  Nurse Communication Mobility status  PT  - Assessment/Plan  Comments on Treatment Session Pt making excellent progress with mobility.  Denies pain. Will f/u in pm for any further d/c needs/questions.  PT Plan Discharge plan remains appropriate;Frequency remains appropriate  PT Frequency 7X/week  Follow Up Recommendations Home health PT;Supervision - Intermittent  Equipment Recommended None recommended by PT  Acute Rehab PT Goals  PT Goal: Supine/Side to Sit - Progress Met  PT Goal: Sit to Supine/Side - Progress Met  PT Goal: Ambulate - Progress Progressing toward goal  PT Goal: Up/Down Stairs - Progress Met  PT Goal: Perform Home Exercise Program - Progress Progressing toward goal  PT General Charges  $$ ACUTE PT VISIT 1 Procedure  PT Treatments  $Gait Training 8-22 mins  $Therapeutic Exercise 8-22 mins    Newell Coral, PTA Acute Rehab [phone] (office)

## 2012-08-03 NOTE — Discharge Summary (Signed)
Patient ID: Andre Jackson MRN: [id number] DOB/AGE: [birth year] 68 y.o.  Admit date: 08/02/2012 Discharge date: 08/03/2012  Admission Diagnoses:  Principal Problem:  *Left knee DJD Active Problems:  Hypertension  Arthritis  Hyperlipidemia  Joint pain  Joint swelling  Diverticulosis  S/P TKR (total knee replacement)   Discharge Diagnoses:  Same  Past Medical History  Diagnosis Date  . Hyperlipidemia     takes Lipitor daily  . Hypertension     takes Prinizide daily  . Joint pain   . Joint swelling   . Seasonal allergies     takes Allegra daily  . Diverticulosis   . Hx of colonic polyps   . Cataract immature   . Arthritis   . Left knee DJD   . S/P TKR (total knee replacement) 2013    right  . Cataract     one side, patient unsure of side    Surgeries: Procedure(s): TOTAL KNEE ARTHROPLASTY on 08/02/2012   Consultants:    Discharged Condition: Improved  Hospital Course: Andre Jackson is an 66 y.o. male who was admitted 08/02/2012 for operative treatment ofLeft knee DJD. Patient has severe unremitting pain that affects sleep, daily activities, and work/hobbies. After pre-op clearance the patient was taken to the operating room on 08/02/2012 and underwent  Procedure(s): TOTAL KNEE ARTHROPLASTY.    Patient was given perioperative antibiotics: Anti-infectives     Start     Dose/Rate Route Frequency Ordered Stop   08/02/12 1700   ceFAZolin (ANCEF) IVPB 2 g/50 mL premix        2 g 100 mL/hr over 30 Minutes Intravenous Every 6 hours 08/02/12 1518 08/02/12 2153   08/02/12 1228   cefUROXime (ZINACEF) injection  Status:  Discontinued          As needed 08/02/12 1230 08/02/12 1327   08/01/12 1351   ceFAZolin (ANCEF) IVPB 2 g/50 mL premix        2 g 100 mL/hr over 30 Minutes Intravenous 60 min pre-op 08/01/12 1351 08/02/12 1124           Patient was given sequential compression devices, early ambulation, and chemoprophylaxis to prevent DVT.  Patient  benefited maximally from hospital stay and there were no complications.    Recent vital signs: Patient Vitals for the past 24 hrs:  BP Temp Temp src Pulse Resp SpO2 Height Weight  08/03/12 0956 121/70 mmHg 98.5 F (36.9 C) - 74  16  97 % - -  08/03/12 0706 121/69 mmHg 98.5 F (36.9 C) - 89  18  99 % - -  08/03/12 0400 - - - - 18  98 % - -  08/03/12 0000 - - - - 18  98 % - -  08/02/12 2327 144/72 mmHg 99.4 F (37.4 C) - 89  18  98 % - -  08/02/12 2000 - - - - 16  98 % - -  08/02/12 1654 - - - - - - 5\' 8"  (1.727 m) 83.462 kg (184 lb)  08/02/12 1500 139/73 mmHg 98.4 F (36.9 C) Oral 86  16  98 % - -  08/02/12 1445 127/75 mmHg 98.5 F (36.9 C) - 85  16  99 % - -  08/02/12 1430 128/82 mmHg - - 90  15  98 % - -  08/02/12 1415 124/74 mmHg - - 84  16  96 % - -  08/02/12 1409 138/80 mmHg - - 83  15  98 % - -  08/02/12  1406 138/80 mmHg - - 84  16  98 % - -  08/02/12 1350 - - - 87  13  98 % - -  08/02/12 1349 134/80 mmHg - - - - - - -  08/02/12 1345 - - - 88  14  97 % - -  08/02/12 1335 123/73 mmHg - - - - - - -  08/02/12 1330 - 98.3 F (36.8 C) - - - - - -     Recent laboratory studies:  Basename 08/03/12 0645  WBC 9.2  HGB 12.5*  HCT 37.4*  PLT 192  NA 136  K 3.5  CL 97  CO2 28  BUN 15  CREATININE 0.90  GLUCOSE 148*  INR --  CALCIUM 8.7     Discharge Medications:     Medication List     As of 08/03/2012 10:39 AM    STOP taking these medications         aspirin EC 81 MG tablet      meloxicam 15 MG tablet   Commonly known as: MOBIC      TAKE these medications         acetaminophen 500 MG tablet   Commonly known as: TYLENOL   Take 500 mg by mouth every 6 (six) hours as needed. For pain.      atorvastatin 10 MG tablet   Commonly known as: LIPITOR   Take 10 mg by mouth daily.      bisacodyl 5 MG EC tablet   Commonly known as: DULCOLAX   2 tablets before dinner nightly until bowels are regular.  Laxitive      celecoxib 200 MG capsule   Commonly known as:  CELEBREX   Take 1 capsule (200 mg total) by mouth daily.      DSS 100 MG Caps   1 pill 2 times a day while on pain meds.  Stool softener      enoxaparin 30 MG/0.3ML injection   Commonly known as: LOVENOX   Inject 0.3 mLs (30 mg total) into the skin every 12 (twelve) hours.      fexofenadine 180 MG tablet   Commonly known as: ALLEGRA   Take 180 mg by mouth daily. For allergies.      HYDROcodone-acetaminophen 5-325 MG per tablet   Commonly known as: NORCO/VICODIN   Take 1-2 tablets by mouth every 4 (four) hours as needed.      lisinopril-hydrochlorothiazide 20-25 MG per tablet   Commonly known as: PRINZIDE,ZESTORETIC   Take 1 tablet by mouth daily.        Diagnostic Studies: No results found.  Disposition: 06-Home-Health Care Svc      Discharge Orders    Future Orders Please Complete By Expires   Diet - low sodium heart healthy      Call MD / Call 911      Comments:   If you experience chest pain or shortness of breath, CALL 911 and be transported to the hospital emergency room.  If you develope a fever above 101 F, pus (white drainage) or increased drainage or redness at the wound, or calf pain, call your surgeon's office.   Constipation Prevention      Comments:   Drink plenty of fluids.  Prune juice may be helpful.  You may use a stool softener, such as Colace (over the counter) 100 mg twice a day.  Use MiraLax (over the counter) for constipation as needed.   Increase activity slowly as tolerated  Discharge instructions      Comments:   Total Knee Replacement Care After Refer to this sheet in the next few weeks. These discharge instructions provide you with general information on caring for yourself after you leave the hospital. Your caregiver may also give you specific instructions. Your treatment has been planned according to the most current medical practices available, but unavoidable complications sometimes occur. If you have any problems or questions after  discharge, please call your caregiver. Regaining a near full range of motion of your knee within the first 3 to 6 weeks after surgery is critical. HOME CARE INSTRUCTIONS  You may resume a normal diet and activities as directed.  Perform exercises as directed.  Place yellow foam block, yellow side up under heel at all times except when in CPM or when walking.  DO NOT modify, tear, cut, or change in any way. You will receive physical therapy daily  Take showers instead of baths until informed otherwise.  Change bandages (dressings)daily Do not take over-the-counter or prescription medicines for pain, discomfort, or fever. Eat a well-balanced diet.  Avoid lifting or driving until you are instructed otherwise.  Make an appointment to see your caregiver for stitches (suture) or staple removal as directed.  If you have been sent home with a continuous passive motion machine (CPM machine), 0-90 degrees 6 hrs a day   2 hrs a shift SEEK MEDICAL CARE IF: You have swelling of your calf or leg.  You develop shortness of breath or chest pain.  You have redness, swelling, or increasing pain in the wound.  There is pus or any unusual drainage coming from the surgical site.  You notice a bad smell coming from the surgical site or dressing.  The surgical site breaks open after sutures or staples have been removed.  There is persistent bleeding from the suture or staple line.  You are getting worse or are not improving.  You have any other questions or concerns.  SEEK IMMEDIATE MEDICAL CARE IF:  You have a fever.  You develop a rash.  You have difficulty breathing.  You develop any reaction or side effects to medicines given.  Your knee motion is decreasing rather than improving.  MAKE SURE YOU:  Understand these instructions.  Will watch your condition.  Will get help right away if you are not doing well or get worse.   CPM      Comments:   Continuous passive motion machine (CPM):      Use the CPM  from 0 to 90 for 6 hours per day.       You may break it up into 2 or 3 sessions per day.      Use CPM for 2 weeks or until you are told to stop.   TED hose      Comments:   Use stockings (TED hose) for 2 weeks on both leg(s).  You may remove them at night for sleeping.   Change dressing      Comments:   Change the dressing daily with sterile 4 x 4 inch gauze dressing and apply TED hose.  You may clean the incision with alcohol prior to redressing.   Do not put a pillow under the knee. Place it under the heel.      Scheduling Instructions:   Place yellow foam block, yellow side up under heel at all times except when in CPM or when walking.  DO NOT modify, tear, cut, or change in any  way the yellow foam block.      Follow-up Information    Follow up with Nilda Simmer, MD. On 08/16/2012. (appt time 2:30)    Contact information:   [address] [place] [address] [phone]           Signed: Pascal Lux 08/03/2012, 10:39 AM

## 2012-08-03 NOTE — Progress Notes (Signed)
I agree with the following treatment note after reviewing documentation.   Johnston, Duy Lemming Brynn   OTR/L Pager: [phone] Office: [phone] .   

## 2012-08-03 NOTE — Evaluation (Signed)
Occupational Therapy Evaluation Patient Details Name: Andre Jackson MRN: [id number] DOB: Dec 27, 1945 Today's Date: 08/03/2012 Time: 4098-1191 OT Time Calculation (min): 23 min  OT Assessment / Plan / Recommendation Clinical Impression  Pt. 66 yo male s/p left TKA. Pt. is doing very well, states he has had surgery to previous knee 3 months ago. Demonstrates supervision level to complete ADL's. Wife able to (A) at home with any needs. No further acute OT needs at this time. OT to sign off      OT Assessment  Patient does not need any further OT services    Follow Up Recommendations  No OT follow up    Barriers to Discharge      Equipment Recommendations  None recommended by OT    Recommendations for Other Services    Frequency       Precautions / Restrictions Precautions Precautions: Knee Required Braces or Orthoses: Knee Immobilizer - Left Knee Immobilizer - Left: On except when in CPM Restrictions Weight Bearing Restrictions: Yes LLE Weight Bearing: Weight bearing as tolerated   Pertinent Vitals/Pain Pt. States no pain    ADL  Grooming: Performed;Wash/dry hands;Supervision/safety Where Assessed - Grooming: Unsupported standing Lower Body Bathing: Simulated;Supervision/safety Where Assessed - Lower Body Bathing: Unsupported sit to stand Lower Body Dressing: Performed;Supervision/safety Where Assessed - Lower Body Dressing: Unsupported sit to stand Toilet Transfer: Simulated;Modified independent Toilet Transfer Method: Sit to Barista: Raised toilet seat without arms Toileting - Clothing Manipulation and Hygiene: Performed;Independent Where Assessed - Toileting Clothing Manipulation and Hygiene: Standing Tub/Shower Transfer: Engineer, manufacturing Method: Ambulating Equipment Used: Gait belt;Rolling walker Transfers/Ambulation Related to ADLs: Supervision for safety during transfers and ambulation  ADL Comments: Pt. is  supervision to complete all ADL's. Demonstrated safe technique for getting in and out of tub, does not use a tub bench or seat and does not want one. Pt. able to dress and bathe LB at supervision level. Pt. already educated on AE to (A) with ADL's but states he does just fine on his own.      OT Diagnosis:    OT Problem List:   OT Treatment Interventions:     OT Goals    Visit Information  Last OT Received On: 08/03/12 Assistance Needed: +1    Subjective Data  Subjective: I am ready to go Patient Stated Goal: To go home   Prior Functioning     Home Living Lives With: Spouse Available Help at Discharge: Family;Available 24 hours/day Type of Home: House Home Access: Stairs to enter [employer] of Steps: 2 Entrance Stairs-Rails: None Home Layout: One level Bathroom Shower/Tub: Engineer, manufacturing systems: Handicapped height Bathroom Accessibility: Yes How Accessible: Accessible via walker Home Adaptive Equipment: Bedside commode/3-in-1;Walker - rolling;Straight cane;Grab bars in shower Prior Function Level of Independence: Independent Able to Take Stairs?: Yes Driving: Yes Vocation: Retired Musician: No difficulties Dominant Hand: Right         Vision/Perception     Cognition  Overall Cognitive Status: Appears within functional limits for tasks assessed/performed Arousal/Alertness: Awake/alert Orientation Level: Appears intact for tasks assessed Behavior During Session: [facility] for tasks performed    Extremity/Trunk Assessment Right Upper Extremity Assessment RUE ROM/Strength/Tone: Within functional levels Left Upper Extremity Assessment LUE ROM/Strength/Tone: Within functional levels Trunk Assessment Trunk Assessment: Normal     Mobility Bed Mobility Bed Mobility: Supine to Sit;Sitting - Scoot to Edge of Bed Supine to Sit: 7: Independent Sitting - Scoot to Edge of Bed: 7: Independent Sit to Supine:  7: Independent Details  for Bed Mobility Assistance: Able to manage LLE without any (A)  Transfers Transfers: Sit to Stand;Stand to Sit Sit to Stand: 6: Modified independent (Device/Increase time);With upper extremity assist;From bed Stand to Sit: 6: Modified independent (Device/Increase time);With upper extremity assist;With armrests;To chair/3-in-1          Exercise Total Joint Exercises Ankle Circles/Pumps: AROM;Both;20 reps;Supine Quad Sets: AROM;Both;20 reps;Supine Towel Squeeze: AROM;Both;20 reps;Supine Short Arc Quad: AROM;Both;20 reps;Supine Heel Slides: AROM;Left;20 reps;Supine Hip ABduction/ADduction: AROM;Both;20 reps;Supine Straight Leg Raises: AROM;Both;20 reps;Supine   Balance     End of Session OT - End of Session Equipment Utilized During Treatment: Gait belt;Left knee immobilizer Activity Tolerance: Patient tolerated treatment well Patient left: in chair;with call bell/phone within reach;with family/visitor present Nurse Communication: Mobility status CPM Left Knee CPM Left Knee: Off Additional Comments: trapeze bar patirnt helper  GO     Cleora Fleet 08/03/2012, 10:48 AM

## 2012-08-03 NOTE — Care Management Note (Signed)
    Page 1 of 1   08/03/2012     11:29:49 AM   CARE MANAGEMENT NOTE 08/03/2012  Patient:  Andre Jackson, Andre Jackson   Account Number:  [id number]  Date Initiated:  08/02/2012  Documentation initiated by:  Anette Guarneri  Subjective/Objective Assessment:   left TKA  will need [facility] services and DME     Action/Plan:   home with [facility] services   Anticipated DC Date:  08/04/2012   Anticipated DC Plan:  HOME W HOME HEALTH SERVICES      DC Planning Services  CM consult      Choice offered to / List presented to:             Status of service:  Completed, signed off Medicare Important Message given?   (If response is "NO", the following Medicare IM given date fields will be blank) Date Medicare IM given:   Date Additional Medicare IM given:    Discharge Disposition:    Per UR Regulation:  Reviewed for med. necessity/level of care/duration of stay  If discussed at Long Length of Stay Meetings, dates discussed:    Comments:  08/03/12 11:28  Anette Guarneri RN/CM [facility] services pre-arranged by MD office with [facility] patient does not need CPM, does have RW/3n1

## 2012-08-03 NOTE — Evaluation (Signed)
I agree with the following treatment note after reviewing documentation.   Johnston, Bradan Congrove Brynn   OTR/L Pager: [phone] Office: [phone] .   

## 2012-08-03 NOTE — Progress Notes (Signed)
Occupational Therapy Discharge Patient Details Name: CHAZE HRUSKA MRN: [id number] DOB: 27-May-1946 Today's Date: 08/03/2012 Time: 4098-1191 OT Time Calculation (min): 23 min  Patient discharged from OT services secondary to Pt. educated on knee precautions and demonstrates supervision level to complete ADL's. Wife (A) with needs at home and is educated from previous knee surgery. No further acute OT needs at this time. .  Please see latest therapy progress note for current level of functioning and progress toward goals.    Progress and discharge plan discussed with patient and/or caregiver: Patient/Caregiver agrees with plan  GO     Cleora Fleet 08/03/2012, 10:49 AM

## 2013-06-30 IMAGING — CR DG CHEST 2V
1 series · 2 of 2 positions shown · non-contrast
Comparison: none

REASON FOR EXAM: HTN
COMMENTS:   LMP: (Male)

[Series 1: pa · 0.17mm/px · 2 of 2 slices shown]
[im 1/2]
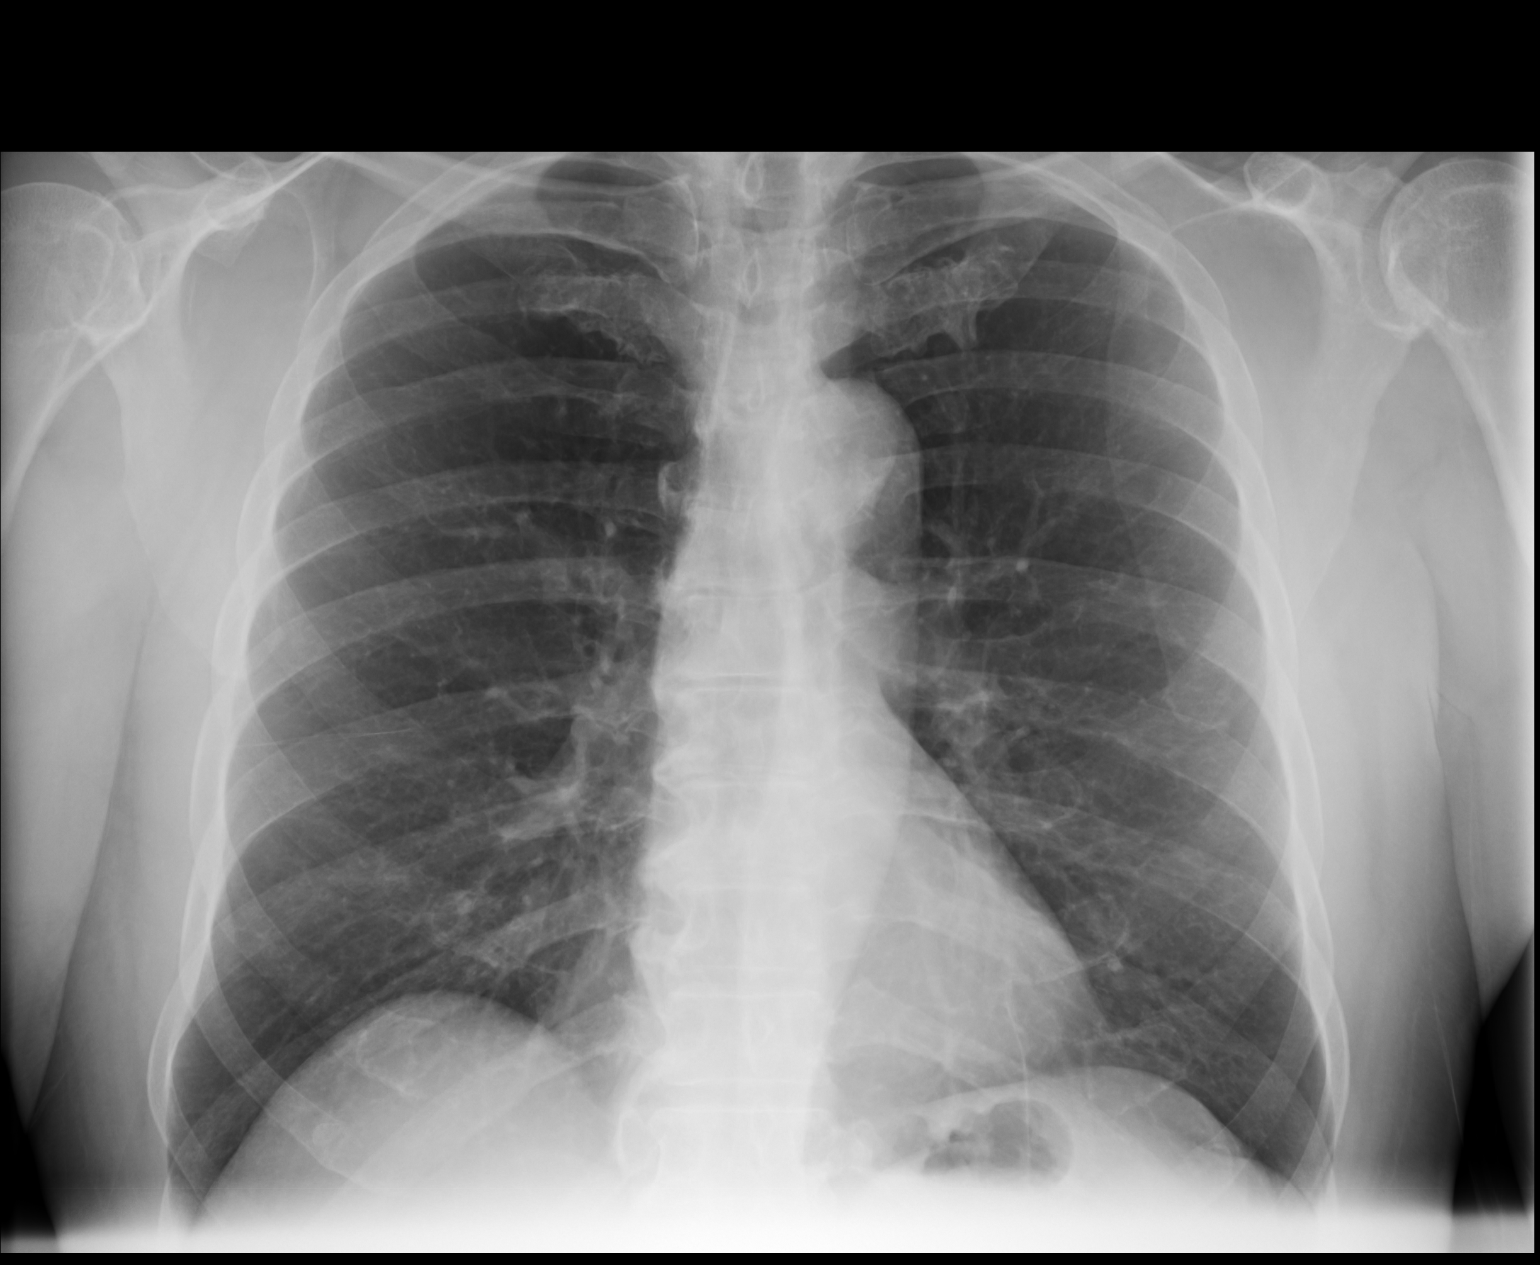
[im 2/2]
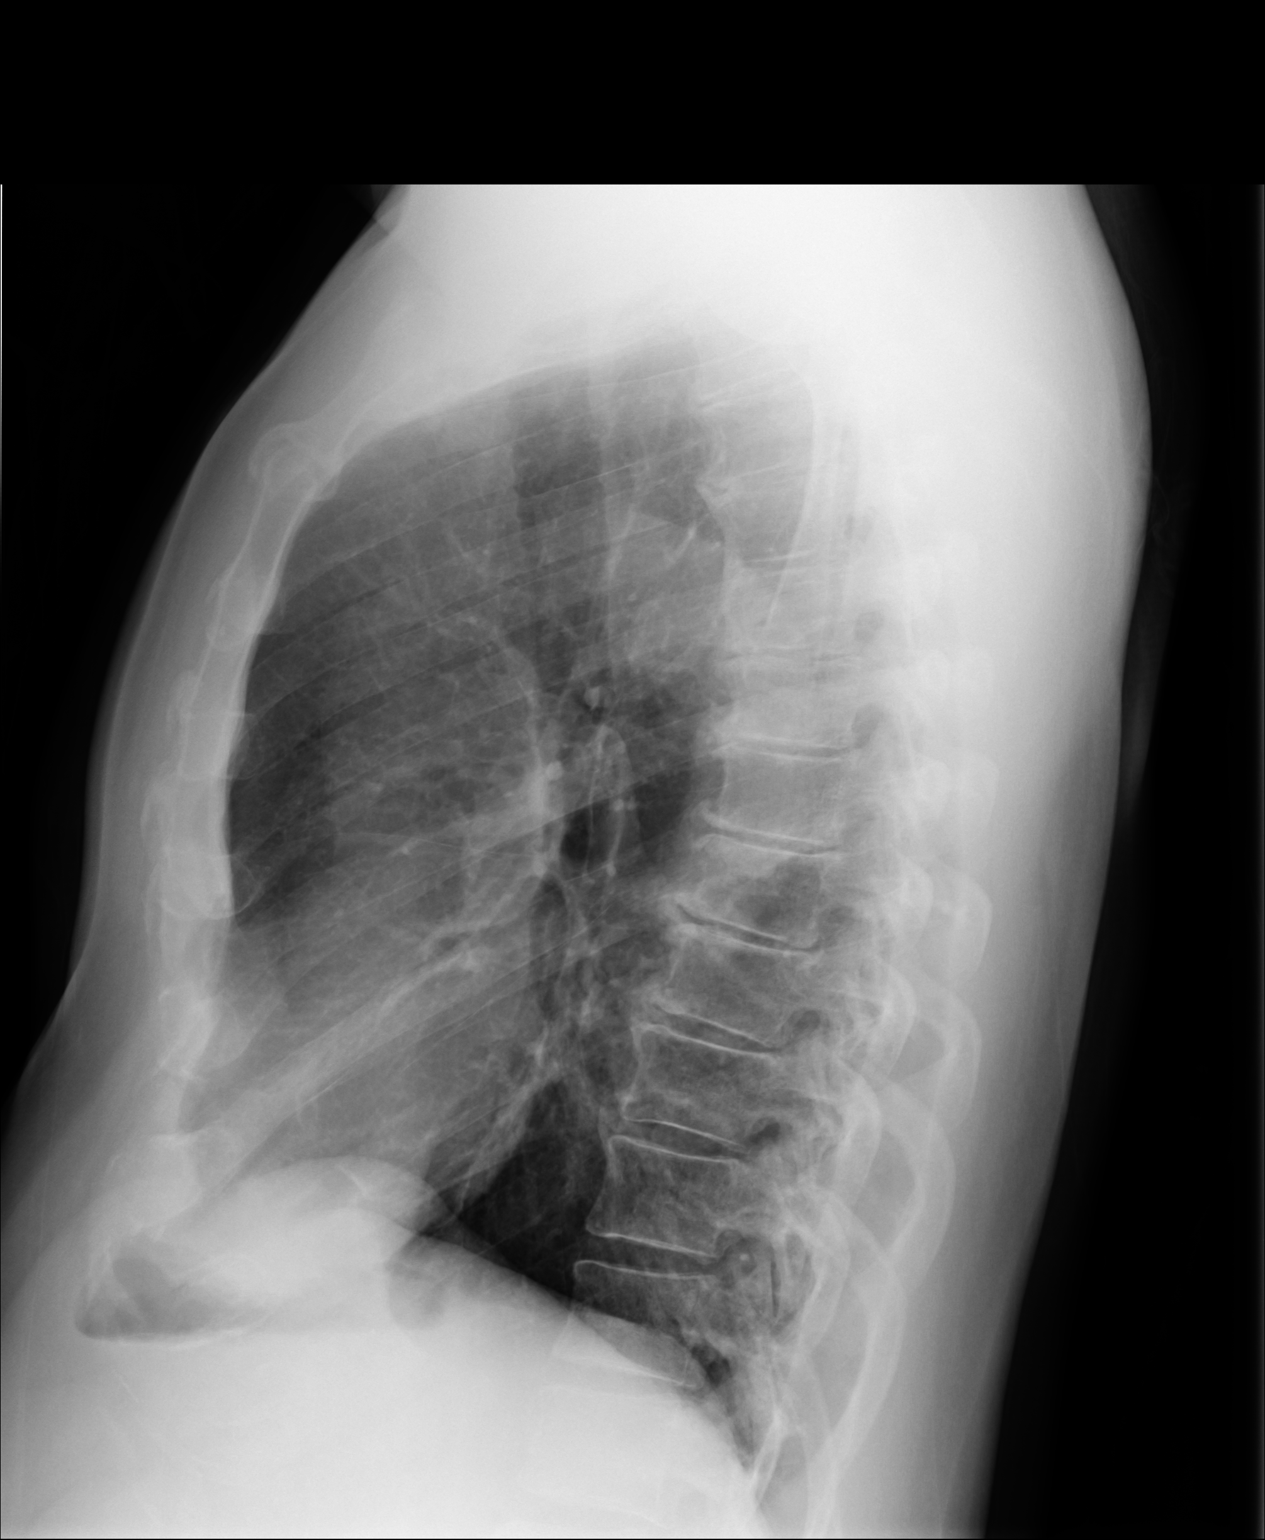

[2 of 2 positions shown; findings below may reference images not displayed]

PROCEDURE:     DXR - DXR CHEST PA (OR AP) AND LATERAL  - September 23, 2011 [DATE]

RESULT:     Comparison is made to the previous study 07 March, 2008.

The lungs are hyperinflated. The lungs are clear. The heart and pulmonary
vessels are normal. The bony and mediastinal structures are unremarkable.
There is no effusion. There is no pneumothorax or evidence of congestive
failure.
IMPRESSION: No acute cardiopulmonary disease. Stable appearance.

## 2014-03-23 DIAGNOSIS — I10 Essential (primary) hypertension: Secondary | ICD-10-CM | POA: Insufficient documentation

## 2014-10-13 DIAGNOSIS — Z87442 Personal history of urinary calculi: Secondary | ICD-10-CM

## 2014-10-13 HISTORY — DX: Personal history of urinary calculi: Z87.442

## 2015-03-09 NOTE — H&P (Signed)
TOTAL HIP ADMISSION H&P  Patient is admitted for left total hip arthroplasty.  Subjective:  Chief Complaint: left hip pain  HPI: Andre Jackson, 69 y.o. male, has a history of pain and functional disability in the left hip(s) due to arthritis and patient has failed non-surgical conservative treatments for greater than 12 weeks to include NSAID's and/or analgesics, corticosteriod injections and activity modification.  Onset of symptoms was gradual starting 1 years ago with gradually worsening course since that time.The patient noted no past surgery on the left hip(s).  Patient currently rates pain in the left hip at 7 out of 10 with activity. Patient has night pain, worsening of pain with activity and weight bearing, pain that interfers with activities of daily living, pain with passive range of motion and crepitus. Patient has evidence of joint space narrowing by imaging studies. This condition presents safety issues increasing the risk of falls.  There is no current active infection.  Patient Active Problem List   Diagnosis Date Noted  . Left knee DJD 08/02/2012  . Hyperlipidemia   . Joint pain   . Joint swelling   . Seasonal allergies   . Diverticulosis   . Hx of colonic polyps   . S/P TKR (total knee replacement)   . Hypertension   . Arthritis    Past Medical History  Diagnosis Date  . Hyperlipidemia     takes Lipitor daily  . Hypertension     takes Prinizide daily  . Joint pain   . Joint swelling   . Seasonal allergies     takes Allegra daily  . Diverticulosis   . Hx of colonic polyps   . Cataract immature   . Arthritis   . Left knee DJD   . S/P TKR (total knee replacement) 2013    right  . Cataract     one side, patient unsure of side    Past Surgical History  Procedure Laterality Date  . Lipoma excision  07/2011    neck  . I&d extremity  ~ 2010    left knee  . Replacement total knee  04/05/12    right  . Tonsillectomy and adenoidectomy      as a child  .  Knee arthroscopy  ~ 2011    left  . Multiple tooth extractions  ~ 2007  . Back surgery    . Lumbar disc surgery  1970's  . Colonoscopy w/ biopsies and polypectomy  07/2011    "have had it once before that also"  . Total knee arthroplasty  04/05/2012    Procedure: TOTAL KNEE ARTHROPLASTY;  Surgeon: Lorn Junes, MD;  Location: [place];  Service: Orthopedics;  Laterality: Right;  DR [place] THIS CASE  . Total knee arthroplasty  08/02/2012    Procedure: TOTAL KNEE ARTHROPLASTY;  Surgeon: Lorn Junes, MD;  Location: [place];  Service: Orthopedics;  Laterality: Left;    No prescriptions prior to admission   No Known Allergies  History  Substance Use Topics  . Smoking status: Former Smoker    Quit date: 02/10/2009  . Smokeless tobacco: Never Used  . Alcohol Use: 0.6 oz/week    1 Cans of beer per week    Family History  Problem Relation Age of Onset  . Cerebral aneurysm Mother   . Hypertension Sister   . Hypertension Mother   . Hypertension Brother      Review of Systems  Constitutional: Negative for fever and chills.  HENT: Negative for ear pain and sore throat.   Eyes: Negative for blurred vision and pain.  Respiratory: Negative for cough and shortness of breath.   Cardiovascular: Negative for chest pain and palpitations.  Gastrointestinal: Negative for nausea, vomiting and abdominal pain.  Musculoskeletal:       Pain with ambulation of the L hip  Skin: Negative for rash.  Neurological: Negative for dizziness and seizures.  Psychiatric/Behavioral: Negative for depression and suicidal ideas.    Objective:  Physical Exam  Constitutional: He is oriented to person, place, and time. He appears well-developed and well-nourished.  HENT:  Head: Normocephalic and atraumatic.  Eyes: Conjunctivae and EOM are normal. Pupils are equal, round, and reactive to light.  Neck: Normal range of motion. Neck supple.  Cardiovascular: Normal rate and intact distal  pulses.   Respiratory: Effort normal and breath sounds normal.  GI: Soft. Bowel sounds are normal.  Musculoskeletal:  Decreased ROM by 50% due to pain  Neurological: He is alert and oriented to person, place, and time.  Skin: Skin is warm and dry.  Psychiatric: He has a normal mood and affect. His behavior is normal. Judgment and thought content normal.    Vital signs in last 24 hours:    Labs:   Estimated body mass index is 28.11 kg/(m^2) as calculated from the following:   Height as of 08/02/12: 5\' 8"  (1.727 m).   Weight as of 07/26/12: 83.825 kg (184 lb 12.8 oz).   Imaging Review Plain radiographs demonstrate severe degenerative joint disease of the left hip(s). The bone quality appears to be fair for age and reported activity level.  Assessment/Plan:  End stage arthritis, left hip(s)  The patient history, physical examination, clinical judgement of the provider and imaging studies are consistent with end stage degenerative joint disease of the left hip(s) and total hip arthroplasty is deemed medically necessary. The treatment options including medical management, injection therapy, arthroscopy and arthroplasty were discussed at length. The risks and benefits of total hip arthroplasty were presented and reviewed. The risks due to aseptic loosening, infection, stiffness, dislocation/subluxation,  thromboembolic complications and other imponderables were discussed.  The patient acknowledged the explanation, agreed to proceed with the plan and consent was signed. Patient is being admitted for inpatient treatment for surgery, pain control, PT, OT, prophylactic antibiotics, VTE prophylaxis, progressive ambulation and ADL's and discharge planning.The patient is planning to be discharged home with home health services

## 2015-03-22 NOTE — Pre-Procedure Instructions (Deleted)
Andre Jackson  03/22/2015      [id number] - [place], [place] - [address] [place] [address] Phone: [phone] Fax: [phone]              Your procedure is scheduled on  Tuesday, June 21st.   Report to [facility] Admitting at 5:30 AM  Call this number if you have problems the morning of surgery:  [phone]   Remember:   Do not eat food or drink liquids after midnight Monday.  Take these medicines the morning of surgery with A SIP OF WATER : Nothing             PLEASE STOP taking any herbal medications or supplements and anti-inflammatories 4-5 days prior to surgery.   Do not wear jewelry - no rings or watches.  Do not wear lotions or colognes.   You may NOT wear deodorant the morning of surgery..             Men may shave face and neck.   Do not bring valuables to the hospital.  [facility] is not responsible for any belongings or valuables.  Contacts, dentures or bridgework may not be worn into surgery.  Leave your suitcase in the car.  After surgery it may be brought to your room. For patients admitted to the hospital, discharge time will be determined by your treatment team.  Name and phone number of your driver:     Special instructions:  "Preparing for Surgery" instruction sheet.  Please read over the following fact sheets that you were given. Pain Booklet, MRSA Information and Surgical Site Infection Prevention

## 2015-03-23 ENCOUNTER — Encounter (HOSPITAL_COMMUNITY)
Admission: RE | Admit: 2015-03-23 | Discharge: 2015-03-23 | Disposition: A | Discharge status: Home / Self Care | Payer: Medicare Other | Source: Ambulatory Visit | Attending: Orthopedic Surgery | Admitting: Orthopedic Surgery

## 2015-03-23 ENCOUNTER — Encounter (HOSPITAL_COMMUNITY): Payer: Self-pay

## 2015-03-23 DIAGNOSIS — M1612 Unilateral primary osteoarthritis, left hip: Secondary | ICD-10-CM | POA: Insufficient documentation

## 2015-03-23 DIAGNOSIS — Z01812 Encounter for preprocedural laboratory examination: Secondary | ICD-10-CM | POA: Insufficient documentation

## 2015-03-23 DIAGNOSIS — I444 Left anterior fascicular block: Secondary | ICD-10-CM | POA: Diagnosis not present

## 2015-03-23 LAB — URINALYSIS, ROUTINE W REFLEX MICROSCOPIC
Bilirubin Urine: NEGATIVE
Glucose, UA: NEGATIVE mg/dL
HGB URINE DIPSTICK: NEGATIVE
Ketones, ur: NEGATIVE mg/dL
Leukocytes, UA: NEGATIVE
Nitrite: NEGATIVE
PROTEIN: NEGATIVE mg/dL
Specific Gravity, Urine: 1.019 (ref 1.005–1.030)
Urobilinogen, UA: 0.2 mg/dL (ref 0.0–1.0)
pH: 5 (ref 5.0–8.0)

## 2015-03-23 LAB — BASIC METABOLIC PANEL
ANION GAP: 9 (ref 5–15)
BUN: 14 mg/dL (ref 6–20)
CO2: 29 mmol/L (ref 22–32)
Calcium: 9.2 mg/dL (ref 8.9–10.3)
Chloride: 100 mmol/L — ABNORMAL LOW (ref 101–111)
Creatinine, Ser: 1.03 mg/dL (ref 0.61–1.24)
GLUCOSE: 104 mg/dL — AB (ref 65–99)
POTASSIUM: 3.5 mmol/L (ref 3.5–5.1)
SODIUM: 138 mmol/L (ref 135–145)

## 2015-03-23 LAB — DIFFERENTIAL
BASOS PCT: 1 % (ref 0–1)
Basophils Absolute: 0 10*3/uL (ref 0.0–0.1)
EOS PCT: 2 % (ref 0–5)
Eosinophils Absolute: 0.1 10*3/uL (ref 0.0–0.7)
Lymphocytes Relative: 49 % — ABNORMAL HIGH (ref 12–46)
Lymphs Abs: 2.7 10*3/uL (ref 0.7–4.0)
MONOS PCT: 10 % (ref 3–12)
Monocytes Absolute: 0.6 10*3/uL (ref 0.1–1.0)
Neutro Abs: 2.1 10*3/uL (ref 1.7–7.7)
Neutrophils Relative %: 38 % — ABNORMAL LOW (ref 43–77)

## 2015-03-23 LAB — SURGICAL PCR SCREEN
MRSA, PCR: NEGATIVE
Staphylococcus aureus: NEGATIVE

## 2015-03-23 LAB — CBC
HEMATOCRIT: 44.3 % (ref 39.0–52.0)
HEMOGLOBIN: 14.6 g/dL (ref 13.0–17.0)
MCH: 28.9 pg (ref 26.0–34.0)
MCHC: 33 g/dL (ref 30.0–36.0)
MCV: 87.5 fL (ref 78.0–100.0)
PLATELETS: 222 10*3/uL (ref 150–400)
RBC: 5.06 MIL/uL (ref 4.22–5.81)
RDW: 13.2 % (ref 11.5–15.5)
WBC: 5.5 10*3/uL (ref 4.0–10.5)

## 2015-03-23 NOTE — Pre-Procedure Instructions (Signed)
Andre Jackson  03/23/2015      [id number] - [place], [place] - [address] [place] [address] Phone: [phone] Fax: [phone]    Your procedure is scheduled on Tuesday, June 21st.   Report to [facility] Admitting at 5:30 AM  Call this number if you have problems the morning of surgery:  [phone]   Remember:  Do not eat food or drink liquids after midnight Monday.  Take these medicines the morning of surgery with A SIP OF WATER : Allegra.                STOP taking any anti-inflammatories, aspirin 4-5 days prior to surgery.   Do not wear jewelry - no rings or watches.  Do not wear lotions or colognes.  You may NOT wear deodorant the day of surgery.             Men may shave face and neck.   Do not bring valuables to the hospital.  [facility] is not responsible for any belongings or valuables.  Contacts, dentures or bridgework may not be worn into surgery.  Leave your suitcase in the car.  After surgery it may be brought to your room. For patients admitted to the hospital, discharge time will be determined by your treatment team.   Name and phone number of your driver:     Special instructions:  "Preparing for Surgery" instruction sheet.  Please read over the following fact sheets that you were given. Pain Booklet, MRSA Information and Surgical Site Infection Prevention

## 2015-03-23 NOTE — Progress Notes (Addendum)
Patient prefers not to wear the blue blood band x 11 days.  Understands he will get sample drawn DOS.   Denies any heart problems or issues.  DA

## 2015-04-02 MED ORDER — ACETAMINOPHEN 500 MG PO TABS
1000.0000 mg | ORAL_TABLET | ORAL | Status: AC
  Administered 2015-04-03: 1000 mg via ORAL
  Filled 2015-04-02: qty 2

## 2015-04-02 MED ORDER — SODIUM CHLORIDE 0.9 % IV SOLN
1000.0000 mg | INTRAVENOUS | Status: AC
  Administered 2015-04-03 (×2): 1000 mg via INTRAVENOUS
  Filled 2015-04-02: qty 10

## 2015-04-02 MED ORDER — POTASSIUM CHLORIDE IN NACL 20-0.45 MEQ/L-% IV SOLN
INTRAVENOUS | Status: DC
  Filled 2015-04-02: qty 1000

## 2015-04-02 MED ORDER — CEFAZOLIN SODIUM-DEXTROSE 2-3 GM-% IV SOLR
2.0000 g | INTRAVENOUS | Status: AC
  Administered 2015-04-03: 2 g via INTRAVENOUS

## 2015-04-03 ENCOUNTER — Inpatient Hospital Stay (HOSPITAL_COMMUNITY): Payer: Medicare Other

## 2015-04-03 ENCOUNTER — Encounter (HOSPITAL_COMMUNITY): Payer: Self-pay | Admitting: *Deleted

## 2015-04-03 ENCOUNTER — Encounter (HOSPITAL_COMMUNITY)
Admission: RE | Disposition: A | Discharge status: Home Health | Payer: Self-pay | Source: Ambulatory Visit | Attending: Orthopedic Surgery

## 2015-04-03 ENCOUNTER — Inpatient Hospital Stay (HOSPITAL_COMMUNITY): Payer: Medicare Other | Admitting: Anesthesiology

## 2015-04-03 ENCOUNTER — Inpatient Hospital Stay (HOSPITAL_COMMUNITY)
Admission: RE | Admit: 2015-04-03 | Discharge: 2015-04-04 | DRG: 470 | Disposition: A | Discharge status: Home Health | Payer: Medicare Other | Source: Ambulatory Visit | Attending: Orthopedic Surgery | Admitting: Orthopedic Surgery

## 2015-04-03 DIAGNOSIS — M1990 Unspecified osteoarthritis, unspecified site: Secondary | ICD-10-CM | POA: Diagnosis present

## 2015-04-03 DIAGNOSIS — Z87891 Personal history of nicotine dependence: Secondary | ICD-10-CM

## 2015-04-03 DIAGNOSIS — M25552 Pain in left hip: Secondary | ICD-10-CM | POA: Diagnosis present

## 2015-04-03 DIAGNOSIS — E785 Hyperlipidemia, unspecified: Secondary | ICD-10-CM | POA: Diagnosis present

## 2015-04-03 DIAGNOSIS — M1612 Unilateral primary osteoarthritis, left hip: Secondary | ICD-10-CM | POA: Diagnosis present

## 2015-04-03 DIAGNOSIS — Z96651 Presence of right artificial knee joint: Secondary | ICD-10-CM | POA: Diagnosis present

## 2015-04-03 DIAGNOSIS — I10 Essential (primary) hypertension: Secondary | ICD-10-CM | POA: Diagnosis present

## 2015-04-03 DIAGNOSIS — Z96649 Presence of unspecified artificial hip joint: Secondary | ICD-10-CM

## 2015-04-03 HISTORY — PX: TOTAL HIP ARTHROPLASTY: SHX124

## 2015-04-03 LAB — TYPE AND SCREEN
ABO/RH(D): A POS
Antibody Screen: NEGATIVE

## 2015-04-03 SURGERY — ARTHROPLASTY, HIP, TOTAL, ANTERIOR APPROACH
Anesthesia: General | Site: Hip | Laterality: Left

## 2015-04-03 MED ORDER — HYDROMORPHONE HCL 1 MG/ML IJ SOLN: 1.0000 mg | INTRAMUSCULAR | Status: DC | PRN

## 2015-04-03 MED ORDER — ONDANSETRON HCL 4 MG PO TABS: 4.0000 mg | ORAL_TABLET | Freq: Four times a day (QID) | ORAL | Status: DC | PRN

## 2015-04-03 MED ORDER — PROPOFOL 10 MG/ML IV BOLUS
INTRAVENOUS | Status: AC
  Filled 2015-04-03: qty 20

## 2015-04-03 MED ORDER — METHOCARBAMOL 500 MG PO TABS: 500.0000 mg | ORAL_TABLET | Freq: Four times a day (QID) | ORAL | Status: DC

## 2015-04-03 MED ORDER — LACTATED RINGERS IV SOLN
INTRAVENOUS | Status: DC | PRN
  Administered 2015-04-03 (×3): via INTRAVENOUS

## 2015-04-03 MED ORDER — SODIUM CHLORIDE 0.9 % IR SOLN
Status: DC | PRN
  Administered 2015-04-03: 40 mL

## 2015-04-03 MED ORDER — ONDANSETRON HCL 4 MG/2ML IJ SOLN
4.0000 mg | Freq: Four times a day (QID) | INTRAMUSCULAR | Status: DC | PRN
Start: 2015-04-03 — End: 2015-04-04

## 2015-04-03 MED ORDER — ONDANSETRON HCL 4 MG/2ML IJ SOLN: 4.0000 mg | Freq: Once | INTRAMUSCULAR | Status: DC | PRN

## 2015-04-03 MED ORDER — METOCLOPRAMIDE HCL 5 MG/ML IJ SOLN: 5.0000 mg | Freq: Three times a day (TID) | INTRAMUSCULAR | Status: DC | PRN

## 2015-04-03 MED ORDER — HYDROCODONE-ACETAMINOPHEN 5-325 MG PO TABS: 1.0000 | ORAL_TABLET | Freq: Four times a day (QID) | ORAL | Status: DC | PRN

## 2015-04-03 MED ORDER — ASPIRIN 325 MG PO TABS: 325.0000 mg | ORAL_TABLET | Freq: Every day | ORAL | Status: DC

## 2015-04-03 MED ORDER — ROCURONIUM BROMIDE 100 MG/10ML IV SOLN
INTRAVENOUS | Status: DC | PRN
  Administered 2015-04-03: 10 mg via INTRAVENOUS
  Administered 2015-04-03: 40 mg via INTRAVENOUS

## 2015-04-03 MED ORDER — DEXTROSE 5 % IV SOLN
500.0000 mg | Freq: Four times a day (QID) | INTRAVENOUS | Status: DC | PRN
  Filled 2015-04-03: qty 5

## 2015-04-03 MED ORDER — METHOCARBAMOL 500 MG PO TABS: 500.0000 mg | ORAL_TABLET | Freq: Four times a day (QID) | ORAL | Status: DC | PRN

## 2015-04-03 MED ORDER — GLYCOPYRROLATE 0.2 MG/ML IJ SOLN
INTRAMUSCULAR | Status: DC | PRN
  Administered 2015-04-03: 0.4 mg via INTRAVENOUS

## 2015-04-03 MED ORDER — ONDANSETRON HCL 4 MG/2ML IJ SOLN
INTRAMUSCULAR | Status: DC | PRN
Start: 2015-04-03 — End: 2015-04-03
  Administered 2015-04-03: 4 mg via INTRAVENOUS

## 2015-04-03 MED ORDER — CELECOXIB 200 MG PO CAPS
200.0000 mg | ORAL_CAPSULE | Freq: Two times a day (BID) | ORAL | Status: DC
Start: 2015-04-03 — End: 2015-04-04
  Administered 2015-04-03 – 2015-04-04 (×2): 200 mg via ORAL
  Filled 2015-04-03 (×2): qty 1

## 2015-04-03 MED ORDER — HYDROCODONE-ACETAMINOPHEN 5-325 MG PO TABS
1.0000 | ORAL_TABLET | ORAL | Status: DC | PRN
  Administered 2015-04-03 – 2015-04-04 (×3): 2 via ORAL
  Filled 2015-04-03 (×3): qty 2

## 2015-04-03 MED ORDER — LISINOPRIL 20 MG PO TABS
20.0000 mg | ORAL_TABLET | Freq: Every day | ORAL | Status: DC
  Administered 2015-04-03 – 2015-04-04 (×2): 20 mg via ORAL
  Filled 2015-04-03 (×2): qty 1

## 2015-04-03 MED ORDER — LISINOPRIL-HYDROCHLOROTHIAZIDE 20-25 MG PO TABS: 1.0000 | ORAL_TABLET | Freq: Every day | ORAL | Status: DC

## 2015-04-03 MED ORDER — METOCLOPRAMIDE HCL 5 MG PO TABS: 5.0000 mg | ORAL_TABLET | Freq: Three times a day (TID) | ORAL | Status: DC | PRN

## 2015-04-03 MED ORDER — HYDROMORPHONE HCL 1 MG/ML IJ SOLN
0.5000 mg | INTRAMUSCULAR | Status: DC | PRN
  Administered 2015-04-03: 0.5 mg via INTRAVENOUS
  Filled 2015-04-03: qty 1

## 2015-04-03 MED ORDER — LIDOCAINE HCL (CARDIAC) 20 MG/ML IV SOLN
INTRAVENOUS | Status: DC | PRN
  Administered 2015-04-03: 60 mg via INTRAVENOUS

## 2015-04-03 MED ORDER — DOCUSATE SODIUM 100 MG PO CAPS
100.0000 mg | ORAL_CAPSULE | Freq: Two times a day (BID) | ORAL | Status: DC
  Administered 2015-04-03 – 2015-04-04 (×2): 100 mg via ORAL
  Filled 2015-04-03 (×2): qty 1

## 2015-04-03 MED ORDER — TRANEXAMIC ACID 1000 MG/10ML IV SOLN
1000.0000 mg | INTRAVENOUS | Status: DC
  Filled 2015-04-03: qty 10

## 2015-04-03 MED ORDER — CHLORHEXIDINE GLUCONATE 4 % EX LIQD: 60.0000 mL | Freq: Once | CUTANEOUS | Status: DC

## 2015-04-03 MED ORDER — DEXAMETHASONE SODIUM PHOSPHATE 4 MG/ML IJ SOLN
INTRAMUSCULAR | Status: AC
  Filled 2015-04-03: qty 2

## 2015-04-03 MED ORDER — ASPIRIN EC 325 MG PO TBEC
325.0000 mg | DELAYED_RELEASE_TABLET | Freq: Every day | ORAL | Status: DC
  Administered 2015-04-04: 325 mg via ORAL
  Filled 2015-04-03: qty 1

## 2015-04-03 MED ORDER — ATORVASTATIN CALCIUM 10 MG PO TABS
10.0000 mg | ORAL_TABLET | Freq: Every evening | ORAL | Status: DC
  Administered 2015-04-03: 10 mg via ORAL
  Filled 2015-04-03: qty 1

## 2015-04-03 MED ORDER — DEXTROSE 5 % IV SOLN
500.0000 mg | INTRAVENOUS | Status: AC
  Administered 2015-04-03: 500 mg via INTRAVENOUS
  Filled 2015-04-03: qty 5

## 2015-04-03 MED ORDER — HYDROCHLOROTHIAZIDE 25 MG PO TABS
25.0000 mg | ORAL_TABLET | Freq: Every day | ORAL | Status: DC
  Administered 2015-04-03 – 2015-04-04 (×2): 25 mg via ORAL
  Filled 2015-04-03 (×2): qty 1

## 2015-04-03 MED ORDER — BUPIVACAINE LIPOSOME 1.3 % IJ SUSP
20.0000 mL | INTRAMUSCULAR | Status: AC
  Administered 2015-04-03: 20 mL
  Filled 2015-04-03: qty 20

## 2015-04-03 MED ORDER — DEXAMETHASONE SODIUM PHOSPHATE 4 MG/ML IJ SOLN
INTRAMUSCULAR | Status: DC | PRN
  Administered 2015-04-03: 8 mg via INTRAVENOUS

## 2015-04-03 MED ORDER — 0.9 % SODIUM CHLORIDE (POUR BTL) OPTIME
TOPICAL | Status: DC | PRN
  Administered 2015-04-03: 1000 mL

## 2015-04-03 MED ORDER — MENTHOL 3 MG MT LOZG: 1.0000 | LOZENGE | OROMUCOSAL | Status: DC | PRN

## 2015-04-03 MED ORDER — ONDANSETRON HCL 4 MG/2ML IJ SOLN
INTRAMUSCULAR | Status: AC
  Filled 2015-04-03: qty 2

## 2015-04-03 MED ORDER — CEFAZOLIN SODIUM-DEXTROSE 2-3 GM-% IV SOLR
2.0000 g | Freq: Four times a day (QID) | INTRAVENOUS | Status: AC
  Administered 2015-04-03 (×2): 2 g via INTRAVENOUS
  Filled 2015-04-03 (×2): qty 50

## 2015-04-03 MED ORDER — DEXAMETHASONE SODIUM PHOSPHATE 10 MG/ML IJ SOLN
10.0000 mg | Freq: Once | INTRAMUSCULAR | Status: AC
  Administered 2015-04-04: 10 mg via INTRAVENOUS
  Filled 2015-04-03: qty 1

## 2015-04-03 MED ORDER — ONDANSETRON HCL 4 MG PO TABS: 4.0000 mg | ORAL_TABLET | Freq: Three times a day (TID) | ORAL | Status: DC | PRN

## 2015-04-03 MED ORDER — LORATADINE 10 MG PO TABS
10.0000 mg | ORAL_TABLET | Freq: Every evening | ORAL | Status: DC
  Administered 2015-04-03: 10 mg via ORAL
  Filled 2015-04-03: qty 1

## 2015-04-03 MED ORDER — POTASSIUM CHLORIDE IN NACL 20-0.45 MEQ/L-% IV SOLN
INTRAVENOUS | Status: DC
  Administered 2015-04-03: 15:00:00 via INTRAVENOUS
  Filled 2015-04-03 (×8): qty 1000

## 2015-04-03 MED ORDER — ACETAMINOPHEN 650 MG RE SUPP: 650.0000 mg | Freq: Four times a day (QID) | RECTAL | Status: DC | PRN

## 2015-04-03 MED ORDER — PHENYLEPHRINE HCL 10 MG/ML IJ SOLN
INTRAMUSCULAR | Status: DC | PRN
  Administered 2015-04-03: 80 ug via INTRAVENOUS
  Administered 2015-04-03: 120 ug via INTRAVENOUS
  Administered 2015-04-03 (×2): 80 ug via INTRAVENOUS
  Administered 2015-04-03: 40 ug via INTRAVENOUS
  Administered 2015-04-03: 80 ug via INTRAVENOUS
  Administered 2015-04-03: 120 ug via INTRAVENOUS
  Administered 2015-04-03 (×2): 80 ug via INTRAVENOUS
  Administered 2015-04-03: 120 ug via INTRAVENOUS

## 2015-04-03 MED ORDER — PHENYLEPHRINE 40 MCG/ML (10ML) SYRINGE FOR IV PUSH (FOR BLOOD PRESSURE SUPPORT)
PREFILLED_SYRINGE | INTRAVENOUS | Status: AC
  Filled 2015-04-03: qty 20

## 2015-04-03 MED ORDER — MIDAZOLAM HCL 2 MG/2ML IJ SOLN
INTRAMUSCULAR | Status: AC
  Filled 2015-04-03: qty 2

## 2015-04-03 MED ORDER — FENTANYL CITRATE (PF) 100 MCG/2ML IJ SOLN
INTRAMUSCULAR | Status: DC | PRN
  Administered 2015-04-03: 100 ug via INTRAVENOUS
  Administered 2015-04-03: 50 ug via INTRAVENOUS

## 2015-04-03 MED ORDER — PROPOFOL 10 MG/ML IV BOLUS
INTRAVENOUS | Status: DC | PRN
  Administered 2015-04-03: 150 mg via INTRAVENOUS
  Administered 2015-04-03: 50 mg via INTRAVENOUS

## 2015-04-03 MED ORDER — ACETAMINOPHEN 325 MG PO TABS
650.0000 mg | ORAL_TABLET | Freq: Four times a day (QID) | ORAL | Status: DC | PRN
  Administered 2015-04-03: 650 mg via ORAL
  Filled 2015-04-03: qty 2

## 2015-04-03 MED ORDER — NEOSTIGMINE METHYLSULFATE 10 MG/10ML IV SOLN
INTRAVENOUS | Status: DC | PRN
  Administered 2015-04-03: 3 mg via INTRAVENOUS

## 2015-04-03 MED ORDER — PHENOL 1.4 % MT LIQD: 1.0000 | OROMUCOSAL | Status: DC | PRN

## 2015-04-03 MED ORDER — ROCURONIUM BROMIDE 50 MG/5ML IV SOLN
INTRAVENOUS | Status: AC
  Filled 2015-04-03: qty 1

## 2015-04-03 MED ORDER — LIDOCAINE HCL (CARDIAC) 20 MG/ML IV SOLN
INTRAVENOUS | Status: AC
  Filled 2015-04-03: qty 5

## 2015-04-03 MED ORDER — FENTANYL CITRATE (PF) 250 MCG/5ML IJ SOLN
INTRAMUSCULAR | Status: AC
  Filled 2015-04-03: qty 5

## 2015-04-03 SURGICAL SUPPLY — 58 items
BAG DECANTER FOR FLEXI CONT (MISCELLANEOUS) ×3 IMPLANT
BLADE SAW SGTL 18X1.27X75 (BLADE) ×2 IMPLANT
BLADE SAW SGTL 18X1.27X75MM (BLADE) ×1
BLADE SURG ROTATE 9660 (MISCELLANEOUS) IMPLANT
CAPT HIP TOTAL 2 ×3 IMPLANT
CLOSURE STERI-STRIP 1/2X4 (GAUZE/BANDAGES/DRESSINGS) ×1
CLSR STERI-STRIP ANTIMIC 1/2X4 (GAUZE/BANDAGES/DRESSINGS) ×2 IMPLANT
COVER SURGICAL LIGHT HANDLE (MISCELLANEOUS) ×3 IMPLANT
DRAPE C-ARM 42X72 X-RAY (DRAPES) IMPLANT
DRAPE IMP U-DRAPE 54X76 (DRAPES) ×3 IMPLANT
DRAPE INCISE IOBAN 66X45 STRL (DRAPES) ×3 IMPLANT
DRAPE ORTHO SPLIT 77X108 STRL (DRAPES) ×4
DRAPE SURG ORHT 6 SPLT 77X108 (DRAPES) ×2 IMPLANT
DRAPE U-SHAPE 47X51 STRL (DRAPES) ×3 IMPLANT
DRSG MEPILEX BORDER 4X8 (GAUZE/BANDAGES/DRESSINGS) ×3 IMPLANT
DURAPREP 26ML APPLICATOR (WOUND CARE) ×3 IMPLANT
ELECT BLADE 4.0 EZ CLEAN MEGAD (MISCELLANEOUS) ×3
ELECT REM PT RETURN 9FT ADLT (ELECTROSURGICAL) ×3
ELECTRODE BLDE 4.0 EZ CLN MEGD (MISCELLANEOUS) ×1 IMPLANT
ELECTRODE REM PT RTRN 9FT ADLT (ELECTROSURGICAL) ×1 IMPLANT
FACESHIELD WRAPAROUND (MASK) ×6 IMPLANT
GLOVE BIO SURGEON STRL SZ7 (GLOVE) ×3 IMPLANT
GLOVE BIO SURGEON STRL SZ7.5 (GLOVE) ×6 IMPLANT
GLOVE BIOGEL PI IND STRL 7.0 (GLOVE) ×2 IMPLANT
GLOVE BIOGEL PI IND STRL 8 (GLOVE) ×1 IMPLANT
GLOVE BIOGEL PI INDICATOR 7.0 (GLOVE) ×4
GLOVE BIOGEL PI INDICATOR 8 (GLOVE) ×2
GLOVE SURG SS PI 6.5 STRL IVOR (GLOVE) ×3 IMPLANT
GLOVE SURG SS PI 7.5 STRL IVOR (GLOVE) ×3 IMPLANT
GLOVE SURG SS PI 8.0 STRL IVOR (GLOVE) ×9 IMPLANT
GOWN STRL REUS W/ TWL LRG LVL3 (GOWN DISPOSABLE) ×3 IMPLANT
GOWN STRL REUS W/ TWL XL LVL3 (GOWN DISPOSABLE) ×1 IMPLANT
GOWN STRL REUS W/TWL LRG LVL3 (GOWN DISPOSABLE) ×6
GOWN STRL REUS W/TWL XL LVL3 (GOWN DISPOSABLE) ×2
KIT BASIN OR (CUSTOM PROCEDURE TRAY) ×3 IMPLANT
KIT ROOM TURNOVER OR (KITS) ×3 IMPLANT
MANIFOLD NEPTUNE II (INSTRUMENTS) ×3 IMPLANT
NDL SAFETY ECLIPSE 18X1.5 (NEEDLE) ×1 IMPLANT
NEEDLE 18GX1X1/2 (RX/OR ONLY) (NEEDLE) IMPLANT
NEEDLE HYPO 18GX1.5 SHARP (NEEDLE) ×2
NS IRRIG 1000ML POUR BTL (IV SOLUTION) ×3 IMPLANT
PACK TOTAL JOINT (CUSTOM PROCEDURE TRAY) ×3 IMPLANT
PACK UNIVERSAL I (CUSTOM PROCEDURE TRAY) ×3 IMPLANT
PAD ARMBOARD 7.5X6 YLW CONV (MISCELLANEOUS) ×3 IMPLANT
SPONGE LAP 18X18 X RAY DECT (DISPOSABLE) IMPLANT
SUT MNCRL AB 4-0 PS2 18 (SUTURE) ×3 IMPLANT
SUT MON AB 2-0 CT1 36 (SUTURE) ×6 IMPLANT
SUT VIC AB 0 CT1 27 (SUTURE) ×4
SUT VIC AB 0 CT1 27XBRD ANBCTR (SUTURE) ×2 IMPLANT
SUT VIC AB 1 CT1 27 (SUTURE) ×4
SUT VIC AB 1 CT1 27XBRD ANBCTR (SUTURE) ×2 IMPLANT
SYR 50ML LL SCALE MARK (SYRINGE) ×3 IMPLANT
SYRINGE 20CC LL (MISCELLANEOUS) ×3 IMPLANT
TOWEL OR 17X24 6PK STRL BLUE (TOWEL DISPOSABLE) ×3 IMPLANT
TOWEL OR 17X26 10 PK STRL BLUE (TOWEL DISPOSABLE) ×3 IMPLANT
TOWEL OR NON WOVEN STRL DISP B (DISPOSABLE) ×3 IMPLANT
TRAY FOLEY CATH 16FRSI W/METER (SET/KITS/TRAYS/PACK) IMPLANT
WATER STERILE IRR 1000ML POUR (IV SOLUTION) ×3 IMPLANT

## 2015-04-03 NOTE — Interval H&P Note (Signed)
History and Physical Interval Note:  04/03/2015 7:20 AM  Andre Jackson  has presented today for surgery, with the diagnosis of OA LEFT HIP  The various methods of treatment have been discussed with the patient and family. After consideration of risks, benefits and other options for treatment, the patient has consented to  Procedure(s): TOTAL HIP ARTHROPLASTY ANTERIOR APPROACH (Left) as a surgical intervention .  The patient's history has been reviewed, patient examined, no change in status, stable for surgery.  I have reviewed the patient's chart and labs.  Questions were answered to the patient's satisfaction.     Junie Avilla D

## 2015-04-03 NOTE — Transfer of Care (Signed)
Immediate Anesthesia Transfer of Care Note  Patient: Andre Jackson  Procedure(s) Performed: Procedure(s): TOTAL HIP ARTHROPLASTY ANTERIOR APPROACH (Left)  Patient Location: PACU  Anesthesia Type:General  Level of Consciousness: awake, alert , oriented and patient cooperative  Airway & Oxygen Therapy: Patient Spontanous Breathing and Patient connected to nasal cannula oxygen  Post-op Assessment: Report given to RN and Post -op Vital signs reviewed and stable  Post vital signs: Reviewed and stable  Last Vitals:  Filed Vitals:   04/03/15 0616  BP: 128/77  Pulse: 86  Temp: 37 C  Resp: 20    Complications: No apparent anesthesia complications

## 2015-04-03 NOTE — Progress Notes (Signed)
CM talked to patient, wife and adult daughter in patient's room.  Patient and family selected [facility] for HHPT.  Wife says she will be supervising patient.  CM called and left voicemail message for Andre Jackson of [facility] at [phone] to arrange HHPT for patient at discharge.

## 2015-04-03 NOTE — Progress Notes (Signed)
Orthopedic Tech Progress Note Patient Details:  Andre Jackson [birth year] [id number]  Patient ID: Andre Jackson, male   DOB: 08/12/1946, 69 y.o.   MRN: [id number] Viewed order from doctor's order list  Hildred Priest 04/03/2015, 2:10 PM

## 2015-04-03 NOTE — Progress Notes (Signed)
Orthopedic Tech Progress Note Patient Details:  Andre Jackson Jan 03, 1946 [id number]  Ortho Devices Ortho Device/Splint Location: trapeze bar patient helper Ortho Device/Splint Interventions: Application   Hildred Priest 04/03/2015, 2:10 PM

## 2015-04-03 NOTE — Care Management Note (Signed)
Case Management Note  Patient Details  Name: Andre Jackson MRN: [id number] Date of Birth: October 05, 1946  Subjective/Objective:         new THA after OA management failed, anterior           Action/Plan: HHPT  Expected Discharge Date:                  Expected Discharge Plan:  [place]  In-House Referral:     Discharge planning Services  CM Consult  Post Acute Care Choice:    Choice offered to:  Patient, Spouse, Adult Children  DME Arranged:    DME Agency:     HH Arranged:  PT [place]:  [place]  Status of Service:     Medicare Important Message Given:    Date Medicare IM Given:    Medicare IM give by:    Date Additional Medicare IM Given:    Additional Medicare Important Message give by:     If discussed at [place] of Stay Meetings, dates discussed:    Additional Comments:  Dimas Aguas, RN 04/03/2015, 8:48 PM

## 2015-04-03 NOTE — Anesthesia Preprocedure Evaluation (Signed)
Anesthesia Evaluation  Patient identified by MRN, date of birth, ID band Patient awake    Reviewed: Allergy & Precautions, NPO status , Patient's Chart, lab work & pertinent test results  Airway Mallampati: I       Dental   Pulmonary former smoker,    Pulmonary exam normal       Cardiovascular hypertension, Normal cardiovascular exam    Neuro/Psych    GI/Hepatic   Endo/Other    Renal/GU      Musculoskeletal  (+) Arthritis -,   Abdominal   Peds  Hematology   Anesthesia Other Findings   Reproductive/Obstetrics                             Anesthesia Physical Anesthesia Plan  ASA: II  Anesthesia Plan: General   Post-op Pain Management:    Induction: Intravenous  Airway Management Planned: Oral ETT  Additional Equipment:   Intra-op Plan:   Post-operative Plan: Extubation in OR  Informed Consent: I have reviewed the patients History and Physical, chart, labs and discussed the procedure including the risks, benefits and alternatives for the proposed anesthesia with the patient or authorized representative who has indicated his/her understanding and acceptance.     Plan Discussed with: CRNA, Anesthesiologist and Surgeon  Anesthesia Plan Comments:         Anesthesia Quick Evaluation

## 2015-04-03 NOTE — Discharge Instructions (Signed)

## 2015-04-03 NOTE — Op Note (Signed)
04/03/2015  9:23 AM  PATIENT:  Andre Jackson   MRN: [id number]  PRE-OPERATIVE DIAGNOSIS:  OA LEFT HIP  POST-OPERATIVE DIAGNOSIS:  OA LEFT HIP  PROCEDURE:  Procedure(s): TOTAL HIP ARTHROPLASTY ANTERIOR APPROACH  PREOPERATIVE INDICATIONS:    Andre Jackson is an 69 y.o. male who has a diagnosis of <principal problem not specified> and elected for surgical management after failing conservative treatment.  The risks benefits and alternatives were discussed with the patient including but not limited to the risks of nonoperative treatment, versus surgical intervention including infection, bleeding, nerve injury, periprosthetic fracture, the need for revision surgery, dislocation, leg length discrepancy, blood clots, cardiopulmonary complications, morbidity, mortality, among others, and they were willing to proceed.     OPERATIVE REPORT     SURGEON:   MURPHY, Ernesta Amble, MD    ASSISTANT:  Lovett Calender, PA-C, She was present and scrubbed throughout the case, critical for completion in a timely fashion, and for retraction, instrumentation, and closure.     ANESTHESIA:  General    COMPLICATIONS:  None.     COMPONENTS:  [employer]  anato femur size 7 with a 36 mm +0 head ball and a PSL acetabular shell size 56 with a  polyethylene liner    PROCEDURE IN DETAIL:   The patient was met in the holding area and  identified.  The appropriate hip was identified and marked at the operative site.  The patient was then transported to the OR  and  placed under general anesthesia.  At that point, the patient was  placed in the supine position and  secured to the operating room table and all bony prominences padded. He received pre-operative antibiotics    The operative lower extremity was prepped from the iliac crest to the distal leg.  Sterile draping was performed.  Time out was performed prior to incision.      Skin incision was made just 2 cm lateral to the ASIS  extending in line with the tensor  fascia lata. Electrocautery was used to control all bleeders. I dissected down sharply to the fascia of the tensor fascia lata was confirmed that the muscle fibers beneath were running posteriorly. I then incised the fascia over the superficial tensor fascia lata in line with the incision. The fascia was elevated off the anterior aspect of the muscle the muscle was retracted posteriorly and protected throughout the case. I then used electrocautery to incise the tensor fascia lata fascia control and all bleeders. Immediately visible was the fat over top of the anterior neck and capsule.  I removed the anterior fat from the capsule and elevated the rectus muscle off of the anterior capsule. I then removed a large time of capsule. The retractors were then placed over the anterior acetabulum as well as around the superior and inferior neck.  I then removed a section of the femoral neck and a napkin ring fashion. Then used the power course to remove the femoral head from the acetabulum and thoroughly irrigated the acetabulum. I sized the femoral head.    I then exposed the deep acetabulum, cleared out any tissue including the ligamentum teres.   After adequate visualization, I excised the labrum, and then sequentially reamed.  I placed the trial acetabulum, which seated nicely, and then impacted the real cup into place.  Appropriate version and inclination was confirmed clinically matching their bony anatomy, and also with the use transverse acetabular ligament.  I placed a 71m screw in the posterior/superio position  with an excellent bite.    I then placed the polyethylene liner in place  I then abducted the leg and released the external rotators from the posterior femur allowing it to be easily delivered up lateral and anterior to the acetabulum for preparation of the femoral canal.    I then prepared the proximal femur using the cookie-cutter and then sequentially reamed and broached.  A trial broach,  neck, and head was utilized, and I reduced the hip and it was found to have excellent stability with functional range of motion..  I then impacted the real femoral prosthesis into place into the appropriate version, slightly anteverted to the normal anatomy, and I impacted the real head ball into place. The hip was then reduced and taken through functional range of motion and found to have excellent stability. Leg lengths were restored.  I then irrigated the hip copiously again with, and repaired the fascia with Vicryl, followed by monocryl for the subcutaneous tissue, Monocryl for the skin, Steri-Strips and sterile gauze. The wounds were injected. The patient was then awakened and returned to PACU in stable and satisfactory condition. There were no complications.  POST OPERATIVE PLAN: WBAT, DVT px: SCD's/TED and ASA 325  Andre Lynch, MD Orthopedic Surgeon [phone]   This note was generated using a template and dragon dictation system. In light of that, I have reviewed the note and all aspects of it are applicable to this case. Any dictation errors are due to the computerized dictation system.

## 2015-04-03 NOTE — Progress Notes (Signed)
Utilization review completed.  

## 2015-04-03 NOTE — Evaluation (Signed)
Physical Therapy Evaluation Patient Details Name: Andre Jackson MRN: [id number] DOB: 1945-10-30 Today's Date: 04/03/2015   History of Present Illness  69 yo male with new THA after OA management failed, anterior approach with good pain management post-op  Clinical Impression  Pt was seen for new visit to mobilize after his THA, and was comfortably managed for pain.  Has apparently already been moving since he got out of OR, and was very able to follow along with PT with handout exercises.  Most concern for pt is to manage the car to go home.  Plan to see BID for increasing safe and comfortable mobility and to increase hip strength on LLE.    Follow Up Recommendations Home health PT;Supervision - Intermittent    Equipment Recommendations  None recommended by PT    Recommendations for Other Services       Precautions / Restrictions Precautions Precautions: Fall;Anterior Hip Precaution Booklet Issued: Yes (comment) Precaution Comments: spoke with pt about handout Restrictions Weight Bearing Restrictions: No      Mobility  Bed Mobility Overal bed mobility: Needs Assistance Bed Mobility: Supine to Sit     Supine to sit: Min assist     General bed mobility comments: minor help to support trunk and assist LE's to bedside  Transfers Overall transfer level: Needs assistance Equipment used: Rolling walker (2 wheeled);1 person hand held assist Transfers: Sit to/from [facility] Sit to Stand: Min guard;Min assist Stand pivot transfers: Min guard;Min assist       General transfer comment: reminders for hand placment and pt following instructions well  Ambulation/Gait Ambulation/Gait assistance: Min assist;Min guard Ambulation Distance (Feet): 120 Feet Assistive device: Rolling walker (2 wheeled);1 person hand held assist Gait Pattern/deviations: Decreased stance time - left;Decreased stride length;Step-to pattern;Step-through pattern;Antalgic;Wide base of  support Gait velocity: reduced Gait velocity interpretation: Below normal speed for age/gender General Gait Details: step to progressed to step through and has limited tolerance for WBing on LLE, and cues needed for direction of walker to increase unloading with walker on L hip  Stairs            Wheelchair Mobility    Modified Rankin (Stroke Patients Only)       Balance                                             Pertinent Vitals/Pain Pain Assessment: 0-10 Pain Score: 3  Pain Location: L hip in bed Pain Descriptors / Indicators: Operative site guarding Pain Intervention(s): Limited activity within patient's tolerance;Monitored during session;Premedicated before session;Repositioned;Ice applied    Home Living Family/patient expects to be discharged to:: Private residence Living Arrangements: Spouse/significant other                    Prior Function Level of Independence: Independent with assistive device(s)               Hand Dominance        Extremity/Trunk Assessment   Upper Extremity Assessment: Overall WFL for tasks assessed           Lower Extremity Assessment: LLE deficits/detail   LLE Deficits / Details: weakness and L hip in mild flexion contracture due to recent THA  Cervical / Trunk Assessment: Normal  Communication   Communication: No difficulties  Cognition Arousal/Alertness: Awake/alert Behavior During Therapy: WFL for tasks assessed/performed Overall Cognitive  Status: Within Functional Limits for tasks assessed                      General Comments General comments (skin integrity, edema, etc.): Pt is progressing quickly on LLE with motivation to get OOB to chair.  Pt was comfortable in chair and replaced ice in the bag to get post-treatment analgesia    Exercises Total Joint Exercises Ankle Circles/Pumps: AROM;Both;10 reps Quad Sets: AROM;Both;10 reps Heel Slides: AAROM;Both;10 reps;AROM Hip  ABduction/ADduction: AROM;Both;10 reps Long Arc Quad: AROM;Both;10 reps      Assessment/Plan    PT Assessment Patient needs continued PT services  PT Diagnosis Difficulty walking;Acute pain   PT Problem List Decreased strength;Decreased range of motion;Decreased activity tolerance;Decreased balance;Decreased mobility;Decreased coordination;Decreased knowledge of use of DME;Decreased knowledge of precautions;Decreased skin integrity;Pain  PT Treatment Interventions DME instruction;Gait training;Stair training;Functional mobility training;Therapeutic activities;Therapeutic exercise;Neuromuscular re-education;Balance training;Patient/family education   PT Goals (Current goals can be found in the Care Plan section) Acute Rehab PT Goals Patient Stated Goal: to be able to walk PT Goal Formulation: With patient/family Time For Goal Achievement: 04/17/15 Potential to Achieve Goals: Good    Frequency BID   Barriers to discharge Inaccessible home environment one step to enter house    Co-evaluation               End of Session Equipment Utilized During Treatment: Gait belt Activity Tolerance: Patient tolerated treatment well;Patient limited by fatigue Patient left: in chair;with call bell/phone within reach;with family/visitor present;with nursing/sitter in room Nurse Communication: Mobility status         Time: 5997-7414 PT Time Calculation (min) (ACUTE ONLY): 30 min   Charges:   PT Evaluation $Initial PT Evaluation Tier I: 1 Procedure PT Treatments $Gait Training: 8-22 mins   PT G CodesRamond Dial April 07, 2015, 5:08 PM   Mee Hives, PT MS Acute Rehab Dept. Number: [phone] [phone] and [place] [phone]

## 2015-04-03 NOTE — Anesthesia Postprocedure Evaluation (Signed)
  Anesthesia Post-op Note  Patient: Andre Jackson  Procedure(s) Performed: Procedure(s): TOTAL HIP ARTHROPLASTY ANTERIOR APPROACH (Left)  Patient Location: PACU  Anesthesia Type:General  Level of Consciousness: awake, alert , oriented and patient cooperative  Airway and Oxygen Therapy: Patient Spontanous Breathing  Post-op Pain: mild, moderate  Post-op Assessment: Post-op Vital signs reviewed, Patient's Cardiovascular Status Stable, Respiratory Function Stable, Patent Airway, No signs of Nausea or vomiting and Pain level controlled              Post-op Vital Signs: stable  Last Vitals:  Filed Vitals:   04/03/15 1115  BP: 126/79  Pulse: 83  Temp:   Resp: 12    Complications: No apparent anesthesia complications

## 2015-04-04 ENCOUNTER — Encounter (HOSPITAL_COMMUNITY): Payer: Self-pay | Admitting: Orthopedic Surgery

## 2015-04-04 DIAGNOSIS — M1612 Unilateral primary osteoarthritis, left hip: Secondary | ICD-10-CM | POA: Diagnosis present

## 2015-04-04 NOTE — Evaluation (Signed)
Occupational Therapy Evaluation Patient Details Name: Andre Jackson MRN: [id number] DOB: Apr 02, 1946 Today's Date: 04/04/2015    History of Present Illness 69 yo male with new THA after OA management failed, anterior approach with good pain management post-op   Clinical Impression   Pt is currently modified independent to supervision for toilet and shower transfers.  Still with difficulty reaching the left foot for dressing tasks but he will have initial assistance from his wife and do not feel AE would benefit him past a couple more days.  No further OT needs at this time or follow-up.  Pt has all needed DME.     Follow Up Recommendations  No OT follow up    Equipment Recommendations  None recommended by OT       Precautions / Restrictions Precautions Precautions: None Restrictions Weight Bearing Restrictions: No      Mobility Bed Mobility                  Transfers Overall transfer level: Modified independent Equipment used: Rolling walker (2 wheeled) Transfers: Sit to/from Stand Sit to Stand: Modified independent (Device/Increase time) Stand pivot transfers: Modified independent (Device/Increase time)            Balance Overall balance assessment: Modified Independent                                          ADL Overall ADL's : Needs assistance/impaired Eating/Feeding: Independent   Grooming: Wash/dry hands;Wash/dry face;Oral care;Standing;Modified independent   Upper Body Bathing: Modified independent;Sitting   Lower Body Bathing: Minimal assistance;Sit to/from stand   Upper Body Dressing : Modified independent;Sitting   Lower Body Dressing: Minimal assistance;Sit to/from stand   Toilet Transfer: Modified Independent;RW;Comfort height toilet;Ambulation   Toileting- Clothing Manipulation and Hygiene: Modified independent;Sit to/from stand   Tub/ Shower Transfer: Conservation officer, nature;Ambulation;Supervision/safety;Tub  transfer Tub/Shower Transfer Details (indicate cue type and reason): Pt supervision for stepping over into the tub. Functional mobility during ADLs: Modified independent General ADL Comments: Pt doing great able to step into tub with supervision as well as complete toilet transfer and toileting with modified independence.  He does demonstrate some increased difficulty with reaching the left foot for dressing tasks.  Discussed use of a reacher short term but feel he would not need any other AE, as he should improve rapidly enough to forego it.  No further OT needs at this time.      Vision Vision Assessment?: No apparent visual deficits   Perception Perception Perception Tested?: No   Praxis Praxis Praxis tested?: Not tested    Pertinent Vitals/Pain Pain Assessment: Faces Faces Pain Scale: Hurts a little bit Pain Location: left hip Pain Intervention(s): Limited activity within patient's tolerance;Monitored during session     Hand Dominance Right   Extremity/Trunk Assessment Upper Extremity Assessment Upper Extremity Assessment: Overall WFL for tasks assessed   Lower Extremity Assessment Lower Extremity Assessment: Defer to PT evaluation   Cervical / Trunk Assessment Cervical / Trunk Assessment: Normal   Communication Communication Communication: No difficulties   Cognition Arousal/Alertness: Awake/alert Behavior During Therapy: WFL for tasks assessed/performed Overall Cognitive Status: Within Functional Limits for tasks assessed                                Home Living Family/patient expects to be discharged to:: Private residence  Living Arrangements: Spouse/significant other Available Help at Discharge: Family Type of Home: House Home Access: Stairs to enter Technical brewer of Steps: 2 Entrance Stairs-Rails: None Home Layout: One level     Bathroom Shower/Tub: Tub/shower unit;Curtain Shower/tub characteristics: Architectural technologist:  Handicapped height     Home Equipment: Bedside commode;Grab bars - tub/shower;Walker - 2 wheels          Prior Functioning/Environment Level of Independence: Independent with assistive device(s)                                       End of Session Equipment Utilized During Treatment: Rolling walker  Activity Tolerance: Patient tolerated treatment well Patient left: in chair;with call bell/phone within reach   Time: 1310-1335 OT Time Calculation (min): 25 min Charges:  OT General Charges $OT Visit: 1 Procedure OT Evaluation $Initial OT Evaluation Tier I: 1 Procedure OT Treatments $Self Care/Home Management : 8-22 mins Nikka Hakimian OTR/L 04/04/2015, 1:46 PM

## 2015-04-04 NOTE — Discharge Summary (Signed)
Physician Discharge Summary  Patient ID: Andre Jackson MRN: [id number] DOB/AGE: December 10, 1945 69 y.o.  Admit date: 04/03/2015 Discharge date: 04/04/2015  Admission Diagnoses:  Primary osteoarthritis of left hip  Discharge Diagnoses:  Principal Problem:   Primary osteoarthritis of left hip Active Problems:   DJD (degenerative joint disease)   Past Medical History  Diagnosis Date  . Hyperlipidemia     takes Lipitor daily  . Hypertension     takes Prinizide daily  . Joint pain   . Joint swelling   . Seasonal allergies     takes Allegra daily  . Diverticulosis   . Hx of colonic polyps   . Cataract immature   . Arthritis     "knees, hips" (04/03/2015)  . Left knee DJD     Surgeries: Procedure(s): TOTAL HIP ARTHROPLASTY ANTERIOR APPROACH on 04/03/2015   Consultants (if any):    Discharged Condition: Improved  Hospital Course: Andre Jackson is an 69 y.o. male who was admitted 04/03/2015 with a diagnosis of Primary osteoarthritis of left hip and went to the operating room on 04/03/2015 and underwent the above named procedures.    He was given perioperative antibiotics:      Anti-infectives    Start     Dose/Rate Route Frequency Ordered Stop   04/03/15 1400  ceFAZolin (ANCEF) IVPB 2 g/50 mL premix     2 g 100 mL/hr over 30 Minutes Intravenous Every 6 hours 04/03/15 1349 04/03/15 2032   04/03/15 0630  ceFAZolin (ANCEF) IVPB 2 g/50 mL premix     2 g 100 mL/hr over 30 Minutes Intravenous To ShortStay [facility] 04/02/15 1435 04/03/15 0725    .  He was given sequential compression devices, early ambulation, and ASA 325mg  for DVT prophylaxis.  He benefited maximally from the hospital stay and there were no complications.    Recent vital signs:  Filed Vitals:   04/04/15 0541  BP: 123/64  Pulse: 102  Temp: 100.3 F (37.9 C)  Resp: 18    Recent laboratory studies:  Lab Results  Component Value Date   HGB 14.6 03/23/2015   HGB 12.5* 08/03/2012   HGB 14.7  07/26/2012   Lab Results  Component Value Date   WBC 5.5 03/23/2015   PLT 222 03/23/2015   Lab Results  Component Value Date   INR 0.94 07/26/2012   Lab Results  Component Value Date   NA 138 03/23/2015   K 3.5 03/23/2015   CL 100* 03/23/2015   CO2 29 03/23/2015   BUN 14 03/23/2015   CREATININE 1.03 03/23/2015   GLUCOSE 104* 03/23/2015    Discharge Medications:     Medication List    STOP taking these medications        acetaminophen 500 MG tablet  Commonly known as:  TYLENOL     naproxen sodium 220 MG tablet  Commonly known as:  ANAPROX      TAKE these medications        aspirin 325 MG tablet  Take 1 tablet (325 mg total) by mouth daily.     atorvastatin 10 MG tablet  Commonly known as:  LIPITOR  Take 10 mg by mouth daily.     bisacodyl 5 MG EC tablet  Commonly known as:  DULCOLAX  2 tablets before dinner nightly until bowels are regular.  Laxitive     fexofenadine 180 MG tablet  Commonly known as:  ALLEGRA  Take 180 mg by mouth daily. For allergies.  HYDROcodone-acetaminophen 5-325 MG per tablet  Commonly known as:  NORCO  Take 1-2 tablets by mouth every 6 (six) hours as needed for moderate pain.     lisinopril-hydrochlorothiazide 20-25 MG per tablet  Commonly known as:  PRINZIDE,ZESTORETIC  Take 1 tablet by mouth daily.     meloxicam 15 MG tablet  Commonly known as:  MOBIC  Take 15 mg by mouth daily.     methocarbamol 500 MG tablet  Commonly known as:  ROBAXIN  Take 1 tablet (500 mg total) by mouth 4 (four) times daily.     ondansetron 4 MG tablet  Commonly known as:  ZOFRAN  Take 1 tablet (4 mg total) by mouth every 8 (eight) hours as needed for nausea or vomiting.        Diagnostic Studies: Dg Pelvis Portable  04/03/2015   CLINICAL DATA:  Post total hip arthroplasty  EXAM: PORTABLE PELVIS 1-2 VIEWS  COMPARISON:  None.  FINDINGS: Changes of left total hip replacement. No hardware or bony complicating feature. Normal AP alignment. Mild  degenerative changes in the right hip.  IMPRESSION: Left hip replacement.  No complicating feature.   Electronically Signed   By: Rolm Baptise M.D.   On: 04/03/2015 10:58    Disposition: 01-Home or Self Care  Discharge Instructions    Weight bearing as tolerated    Complete by:  As directed   Laterality:  left  Extremity:  Lower           Follow-up Information    Follow up with MURPHY, TIMOTHY D, MD In 1 week.   Specialty:  Orthopedic Surgery   Contact information:   [address] [id number] [phone]        Signed: Gae Dry 04/04/2015, 8:12 AM Cell [phone]

## 2015-04-04 NOTE — Progress Notes (Signed)
Patient sitting up bedside chair, wife in the room. Continues to deny pain - does agree to take pain Rx in preparation for d/c home - ride home to [facility] > 30 minutes. D/C teaching completed, paper work signed, received Rx's for pain, muscle spasms and N/V. IV access - S/L removed. Will be taken to [facility] tower exit by nursing tech via w/c with his wife, personal belongings packed up and wife will carry.

## 2015-04-04 NOTE — Progress Notes (Signed)
Physical Therapy Treatment Patient Details Name: Andre Jackson MRN: [id number] DOB: 12/01/1945 Today's Date: 04/04/2015    History of Present Illness 69 yo male with new THA after OA management failed, anterior approach with good pain management post-op    PT Comments    Pt progressing well and is tolerating amb and stair negotiation without difficulty. Pt safe to d/c home with family once cleared by MD.  Follow Up Recommendations  Home health PT;Supervision - Intermittent     Equipment Recommendations  None recommended by PT    Recommendations for Other Services       Precautions / Restrictions Precautions Precautions: None Restrictions Weight Bearing Restrictions: No    Mobility  Bed Mobility Overal bed mobility: Needs Assistance Bed Mobility: Supine to Sit     Supine to sit: Supervision     General bed mobility comments: v/c's for long sit technique as oppose to quarter turn and straining to bring self up to sitting  Transfers Overall transfer level: Needs assistance Equipment used: Rolling walker (2 wheeled) Transfers: Sit to/from Stand Sit to Stand: Supervision         General transfer comment: v/c's for safe hand placement  Ambulation/Gait Ambulation/Gait assistance: Supervision Ambulation Distance (Feet): 200 Feet Assistive device: Rolling walker (2 wheeled) Gait Pattern/deviations: Step-through pattern;Decreased step length - left;Decreased stance time - left;Antalgic Gait velocity: reduced Gait velocity interpretation: Below normal speed for age/gender General Gait Details: no episodes of LOB, antalgic but improved as amb went on   Stairs Stairs: Yes Stairs assistance: Min guard Stair Management: Backwards;With walker;Forwards Number of Stairs: 1 General stair comments: worked on platform step both ways to simulate home. completed x 3  Wheelchair Mobility    Modified Rankin (Stroke Patients Only)       Balance Overall balance  assessment: Modified Independent                                  Cognition Arousal/Alertness: Awake/alert Behavior During Therapy: WFL for tasks assessed/performed Overall Cognitive Status: Within Functional Limits for tasks assessed                      Exercises Total Joint Exercises Long Arc Quad: AROM;Both;10 reps Marching in Standing: AROM;Left;10 reps;Seated    General Comments        Pertinent Vitals/Pain Pain Assessment: 0-10 Pain Score: 3  Pain Location: L hip Pain Descriptors / Indicators: Operative site guarding Pain Intervention(s): Monitored during session    Home Living                      Prior Function            PT Goals (current goals can now be found in the care plan section) Progress towards PT goals: Progressing toward goals    Frequency  7X/week    PT Plan Current plan remains appropriate    Co-evaluation             End of Session Equipment Utilized During Treatment: Gait belt Activity Tolerance: Patient tolerated treatment well;Patient limited by fatigue Patient left: in chair;with call bell/phone within reach;with family/visitor present;with nursing/sitter in room     Time: 0842-0901 PT Time Calculation (min) (ACUTE ONLY): 19 min  Charges:  $Gait Training: 8-22 mins                    G Codes:  Kingsley Callander 04/04/2015, 9:33 AM   Kittie Plater, PT, DPT Pager #: [phone] Office #: [phone]

## 2015-04-04 NOTE — Care Management Note (Signed)
Case Management Note  Patient Details  Name: Andre Jackson MRN: [id number] Date of Birth: 1945-11-08  Subjective/Objective:            S/p left total hip arthroplasty        Action/Plan: Set up with Arville Go [facility] for HHPT by MD office. Spoke with patient, he chose [facility] which he worked with in the past. Veterinary surgeon at [employer] and set up [place]. Contacted Ibn Stief with [facility] and informed her of change in plans. Patient states that he has a rolling walker and 3N1 at home and will have family available to assist after discharge.  Expected Discharge Date:                  Expected Discharge Plan:  [place]  In-House Referral:  NA  Discharge planning Services  CM Consult  Post Acute Care Choice:  Home Health Choice offered to:  Patient, Spouse, Adult Children  DME Arranged:    DME Agency:     HH Arranged:  PT [place]:  Jackson  Status of Service:  Completed, signed off  Medicare Important Message Given:    Date Medicare IM Given:    Medicare IM give by:    Date Additional Medicare IM Given:    Additional Medicare Important Message give by:     If discussed at [place] of Stay Meetings, dates discussed:    Additional Comments:  Nila Nephew, RN 04/04/2015, 10:20 AM

## 2015-04-04 NOTE — Progress Notes (Signed)
     Subjective:  POD#1 LATH arthroplasty. Patient reports pain as mild.  Resting comfortably in bed. Patient was able to ambulate with PT yesterday.    Objective:   VITALS:   Filed Vitals:   04/03/15 1349 04/03/15 2103 04/04/15 0200 04/04/15 0541  BP: 128/84 129/81 129/62 123/64  Pulse: 97 108 103 102  Temp: 98 F (36.7 C) 99.7 F (37.6 C) 99.2 F (37.3 C) 100.3 F (37.9 C)  TempSrc: Oral Oral    Resp: 18 18 18 18   Height:      Weight:      SpO2: 99% 97% 96% 96%    Neurologically intact ABD soft Neurovascular intact Sensation intact distally Intact pulses distally Dorsiflexion/Plantar flexion intact Incision: dressing C/D/I   Lab Results  Component Value Date   WBC 5.5 03/23/2015   HGB 14.6 03/23/2015   HCT 44.3 03/23/2015   MCV 87.5 03/23/2015   PLT 222 03/23/2015   BMET    Component Value Date/Time   NA 138 03/23/2015 1323   K 3.5 03/23/2015 1323   CL 100* 03/23/2015 1323   CO2 29 03/23/2015 1323   GLUCOSE 104* 03/23/2015 1323   BUN 14 03/23/2015 1323   CREATININE 1.03 03/23/2015 1323   CALCIUM 9.2 03/23/2015 1323   GFRNONAA >60 03/23/2015 1323   GFRAA >60 03/23/2015 1323     Assessment/Plan: 1 Day Post-Op   Active Problems:   DJD (degenerative joint disease)   Up with therapy WBAT in the LLE ASA 325mg  daily for DVT prophylaxis Plan to d/c home today after 2 sessions with PT.    Gae Dry 04/04/2015, 7:59 AM Cell [phone]

## 2015-09-25 ENCOUNTER — Other Ambulatory Visit: Payer: Self-pay | Admitting: Internal Medicine

## 2015-09-25 DIAGNOSIS — R319 Hematuria, unspecified: Secondary | ICD-10-CM

## 2015-10-03 ENCOUNTER — Ambulatory Visit
Admission: RE | Admit: 2015-10-03 | Discharge: 2015-10-03 | Disposition: A | Discharge status: Home / Self Care | Payer: Medicare Other | Source: Ambulatory Visit | Attending: Internal Medicine | Admitting: Internal Medicine

## 2015-10-03 DIAGNOSIS — R319 Hematuria, unspecified: Secondary | ICD-10-CM | POA: Diagnosis present

## 2015-10-03 DIAGNOSIS — N200 Calculus of kidney: Secondary | ICD-10-CM | POA: Insufficient documentation

## 2015-10-03 DIAGNOSIS — N400 Enlarged prostate without lower urinary tract symptoms: Secondary | ICD-10-CM | POA: Insufficient documentation

## 2015-10-04 DIAGNOSIS — N200 Calculus of kidney: Secondary | ICD-10-CM | POA: Insufficient documentation

## 2016-10-29 ENCOUNTER — Ambulatory Visit: Payer: Self-pay

## 2016-11-11 ENCOUNTER — Ambulatory Visit: Payer: Self-pay | Admitting: Urology

## 2016-11-17 ENCOUNTER — Ambulatory Visit: Payer: Medicare Other | Admitting: Urology

## 2016-11-17 ENCOUNTER — Encounter: Payer: Self-pay | Admitting: Urology

## 2016-11-17 VITALS — BP 133/86 | HR 90 | Ht 68.0 in | Wt 194.2 lb

## 2016-11-17 DIAGNOSIS — R972 Elevated prostate specific antigen [PSA]: Secondary | ICD-10-CM

## 2016-11-17 MED ORDER — SILDENAFIL CITRATE 20 MG PO TABS: 40.0000 mg | ORAL_TABLET | Freq: Every day | ORAL | 2 refills | Status: DC | PRN

## 2016-11-17 NOTE — Addendum Note (Signed)
Addended by: Wilson Singer on: 11/17/2016 04:30 PM   Modules accepted: Orders

## 2016-11-17 NOTE — Progress Notes (Signed)
11/17/2016 4:17 PM   Andre Jackson 1946-05-09 [id number]  Referring provider: Madelyn Brunner, MD [place] [facility] [place], [place] [address]  Chief Complaint  Patient presents with  . New Patient (Initial Visit)    elevated PSA    HPI: 71 year old African-American male who presents today for evaluation and management of an elevated PSA. The patient's PSA was checked as part of a routine physical exam. He has no family history of prostate cancer. He has no history of progressive voiding symptoms.  PSA history: 3.9 on 09/25/15 4.3 on 09/19/16  IPS S: 2, Q O L 0 SH IM: 12     PMH: Past Medical History:  Diagnosis Date  . Arthritis    "knees, hips" (04/03/2015)  . Cataract immature   . Diverticulosis   . Hx of colonic polyps   . Hyperlipidemia    takes Lipitor daily  . Hypertension    takes Prinizide daily  . Joint pain   . Joint swelling   . Left knee DJD   . Seasonal allergies    takes Allegra daily    Surgical History: Past Surgical History:  Procedure Laterality Date  . BACK SURGERY    . COLONOSCOPY W/ BIOPSIES AND POLYPECTOMY  07/2011   "have had it once before that also"  . I&D EXTREMITY Left ~ 2010   knee  . JOINT REPLACEMENT    . KNEE ARTHROSCOPY Left ~ 2011  . LIPOMA EXCISION  07/2011   neck  . LUMBAR DISC SURGERY  1970's  . MULTIPLE TOOTH EXTRACTIONS  ~ 2007  . 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 TONSILLECTOMY AND ADENOIDECTOMY  1940's  . TOTAL HIP ARTHROPLASTY Left 04/03/2015  . TOTAL HIP ARTHROPLASTY Left 04/03/2015   Procedure: TOTAL HIP ARTHROPLASTY ANTERIOR APPROACH;  Surgeon: Renette Butters, MD;  Location: [facility];  Service: Orthopedics;  Laterality: Left;  . TOTAL KNEE ARTHROPLASTY  04/05/2012   Procedure: TOTAL KNEE ARTHROPLASTY;  Surgeon: Lorn Junes, MD;  Location: [place];  Service: Orthopedics;  Laterality: Right;  DR [place] THIS CASE  . TOTAL KNEE ARTHROPLASTY  08/02/2012   Procedure: TOTAL KNEE ARTHROPLASTY;  Surgeon: Lorn Junes, MD;  Location: [place];  Service: Orthopedics;  Laterality: Left;    Home Medications:  Allergies as of 11/17/2016   No Known Allergies     Medication List       Accurate as of 11/17/16  4:17 PM. Always use your most recent med list.          aspirin 325 MG tablet Take 1 tablet (325 mg total) by mouth daily.   atorvastatin 10 MG tablet Commonly known as:  LIPITOR Take 10 mg by mouth daily.   bisacodyl 5 MG EC tablet Commonly known as:  DULCOLAX 2 tablets before dinner nightly until bowels are regular.  Laxitive   fexofenadine 180 MG tablet Commonly known as:  ALLEGRA Take 180 mg by mouth daily. For allergies.   HYDROcodone-acetaminophen 5-325 MG tablet Commonly known as:  NORCO Take 1-2 tablets by mouth every 6 (six) hours as needed for moderate pain.   lisinopril-hydrochlorothiazide 20-25 MG tablet Commonly known as:  PRINZIDE,ZESTORETIC Take 1 tablet by mouth daily.   meloxicam 15 MG tablet Commonly known as:  MOBIC Take 15 mg by mouth daily.   methocarbamol 500 MG tablet Commonly known as:  ROBAXIN Take 1 tablet (500 mg total) by mouth 4 (four) times daily.  ondansetron 4 MG tablet Commonly known as:  ZOFRAN Take 1 tablet (4 mg total) by mouth every 8 (eight) hours as needed for nausea or vomiting.       Allergies: No Known Allergies  Family History: Family History  Problem Relation Age of Onset  . Cerebral aneurysm Mother   . Hypertension Mother   . Hypertension Sister   . Hypertension Brother   . Prostate cancer Neg Hx     Social History:  reports that he quit smoking about 7 years ago. His smoking use included Cigarettes. He has a 14.52 pack-year smoking history. He has never used smokeless tobacco. He reports that he drinks about 0.6 oz of alcohol per week . He reports that he does not use drugs.  ROS: UROLOGY Frequent Urination?: No Hard to postpone urination?: No Burning/pain  with urination?: No Get up at night to urinate?: No Leakage of urine?: No Urine stream starts and stops?: No Trouble starting stream?: No Do you have to strain to urinate?: No Blood in urine?: No Urinary tract infection?: No Sexually transmitted disease?: No Injury to kidneys or bladder?: No Painful intercourse?: No Weak stream?: No Erection problems?: Yes Penile pain?: No  Gastrointestinal Nausea?: No Vomiting?: No Indigestion/heartburn?: No Diarrhea?: No Constipation?: No  Constitutional Fever: No Night sweats?: No Weight loss?: No Fatigue?: No  Skin Skin rash/lesions?: No Itching?: No  Eyes Blurred vision?: No Double vision?: No  Ears/Nose/Throat Sore throat?: No Sinus problems?: Yes  Hematologic/Lymphatic Swollen glands?: No Easy bruising?: No  Cardiovascular Leg swelling?: No Chest pain?: No  Respiratory Cough?: No Shortness of breath?: No  Endocrine Excessive thirst?: No  Musculoskeletal Back pain?: No Joint pain?: No  Neurological Headaches?: No Dizziness?: No  Psychologic Depression?: No Anxiety?: No  Physical Exam: BP 133/86   Pulse 90   Ht 5\' 8"  (1.727 m)   Wt 88.1 kg (194 lb 3.2 oz)   BMI 29.53 kg/m   Constitutional:  Alert and oriented, No acute distress. HEENT: [place] AT, moist mucus membranes.  Trachea midline, no masses. Cardiovascular: No clubbing, cyanosis, or edema. Respiratory: Normal respiratory effort, no increased work of breathing. GI: Abdomen is soft, nontender, nondistended, no abdominal masses GU: No CVA tenderness. DRE: Plus 2, smooth, symmetric, no nodules Skin: No rashes, bruises or suspicious lesions. Lymph: No cervical or inguinal adenopathy. Neurologic: Grossly intact, no focal deficits, moving all 4 extremities. Psychiatric: Normal mood and affect.  Laboratory Data: Lab Results  Component Value Date   WBC 5.5 03/23/2015   HGB 14.6 03/23/2015   HCT 44.3 03/23/2015   MCV 87.5 03/23/2015   PLT 222  03/23/2015    Lab Results  Component Value Date   CREATININE 1.03 03/23/2015    No results found for: PSA  No results found for: TESTOSTERONE  No results found for: HGBA1C  Urinalysis    Component Value Date/Time   COLORURINE YELLOW 03/23/2015 [place] 03/23/2015 1323   LABSPEC 1.019 03/23/2015 1323   PHURINE 5.0 03/23/2015 1323   GLUCOSEU NEGATIVE 03/23/2015 1323   HGBUR NEGATIVE 03/23/2015 1323   BILIRUBINUR NEGATIVE 03/23/2015 1323   KETONESUR NEGATIVE 03/23/2015 1323   PROTEINUR NEGATIVE 03/23/2015 1323   UROBILINOGEN 0.2 03/23/2015 1323   NITRITE NEGATIVE 03/23/2015 1323   LEUKOCYTESUR NEGATIVE 03/23/2015 1323    Pertinent Imaging: None  Assessment & Plan:  The patient has a mildly elevated PSA with a very slow rise over the past year. We discussed the implications of an elevated PSA, I  explained to the patient the potential etiologies. Given the stability of his PSA over 2 years, we discussed monitoring his PSA in establishing a trend prior to proceeding with any more aggressive intervention such as prostate biopsy. As such, I ordered a PSA today and we'll repeat again in 6 months and the patient return for further discussion.  The patient would like to try some sildenafil for his erectile dysfunction. I've given him a prescription for this and explained the proper usage of it.  There are no diagnoses linked to this encounter.  No Follow-up on file.  Ardis Hughs, [facility] [address] [place],  [id number] [phone]

## 2016-11-18 LAB — PSA: PROSTATE SPECIFIC AG, SERUM: 3.8 ng/mL (ref 0.0–4.0)

## 2016-11-19 ENCOUNTER — Telehealth: Payer: Self-pay

## 2016-11-19 DIAGNOSIS — R972 Elevated prostate specific antigen [PSA]: Secondary | ICD-10-CM

## 2016-11-19 NOTE — Telephone Encounter (Signed)
Andre Hughs, MD  Lestine Box, LPN        Please inform the patient that his PSA is stable and should be repeated in 6 months.    Spoke with pt wife in reference to PSA results. Wife voiced understanding.

## 2017-04-22 ENCOUNTER — Ambulatory Visit
Admission: RE | Admit: 2017-04-22 | Discharge: 2017-04-22 | Disposition: A | Discharge status: Home / Self Care | Payer: Medicare Other | Source: Ambulatory Visit | Attending: Orthopedic Surgery | Admitting: Orthopedic Surgery

## 2017-04-22 ENCOUNTER — Other Ambulatory Visit: Payer: Self-pay | Admitting: Orthopedic Surgery

## 2017-04-22 DIAGNOSIS — M25512 Pain in left shoulder: Secondary | ICD-10-CM | POA: Diagnosis present

## 2017-04-22 DIAGNOSIS — R52 Pain, unspecified: Secondary | ICD-10-CM

## 2017-04-22 DIAGNOSIS — M19012 Primary osteoarthritis, left shoulder: Secondary | ICD-10-CM | POA: Diagnosis not present

## 2017-05-18 ENCOUNTER — Other Ambulatory Visit: Payer: Medicare Other

## 2017-06-05 ENCOUNTER — Other Ambulatory Visit: Payer: Medicare Other

## 2017-06-05 DIAGNOSIS — R972 Elevated prostate specific antigen [PSA]: Secondary | ICD-10-CM

## 2017-06-06 LAB — PSA: PROSTATE SPECIFIC AG, SERUM: 4.7 ng/mL — AB (ref 0.0–4.0)

## 2017-06-09 ENCOUNTER — Encounter: Payer: Self-pay | Admitting: Urology

## 2017-06-09 ENCOUNTER — Telehealth: Payer: Self-pay

## 2017-06-09 ENCOUNTER — Ambulatory Visit: Payer: Medicare Other | Admitting: Urology

## 2017-06-09 VITALS — BP 136/81 | HR 80 | Ht 68.0 in | Wt 186.0 lb

## 2017-06-09 DIAGNOSIS — R972 Elevated prostate specific antigen [PSA]: Secondary | ICD-10-CM | POA: Diagnosis not present

## 2017-06-09 MED ORDER — SILDENAFIL CITRATE 20 MG PO TABS: 40.0000 mg | ORAL_TABLET | Freq: Every day | ORAL | 4 refills | Status: DC | PRN

## 2017-06-09 NOTE — Telephone Encounter (Signed)
Ardis Hughs, MD  Toniann Fail C, LPN        Please inform the patient that his PSA is slightly higher than previous checks, and that we'll discuss the implications at his next follow-up.  Thanks,  bh    Pt had appt today.

## 2017-06-09 NOTE — Progress Notes (Signed)
06/09/2017 10:26 AM   Andre Jackson 1946/09/19 [id number]  Referring provider: Madelyn Brunner, MD [facility] [place], [place] [address]  Chief Complaint  Patient presents with  . Elevated PSA    HPI: 71 year old African-American male who presents today for evaluation and management of an elevated PSA. The patient's PSA was checked as part of a routine physical exam. He has no family history of prostate cancer. He has no history of progressive voiding symptoms.  PSA history: 4.7 on 06/05/17 3.9 on 09/25/15 4.3 on 09/19/16  IPS S: 2, Q O L 0 SH IM: 12    Intv: The patient tried sildenafil, 40 mg, unsuccessfully. He denies any progression of his urinary tract symptoms.  PMH: Past Medical History:  Diagnosis Date  . Arthritis    "knees, hips" (04/03/2015)  . Cataract immature   . Diverticulosis   . Hx of colonic polyps   . Hyperlipidemia    takes Lipitor daily  . Hypertension    takes Prinizide daily  . Joint pain   . Joint swelling   . Left knee DJD   . Seasonal allergies    takes Allegra daily    Surgical History: Past Surgical History:  Procedure Laterality Date  . BACK SURGERY    . COLONOSCOPY W/ BIOPSIES AND POLYPECTOMY  07/2011   "have had it once before that also"  . I&D EXTREMITY Left ~ 2010   knee  . JOINT REPLACEMENT    . KNEE ARTHROSCOPY Left ~ 2011  . LIPOMA EXCISION  07/2011   neck  . LUMBAR DISC SURGERY  1970's  . MULTIPLE TOOTH EXTRACTIONS  ~ 2007  . 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 TONSILLECTOMY AND ADENOIDECTOMY  1940's  . TOTAL HIP ARTHROPLASTY Left 04/03/2015  . TOTAL HIP ARTHROPLASTY Left 04/03/2015   Procedure: TOTAL HIP ARTHROPLASTY ANTERIOR APPROACH;  Surgeon: Renette Butters, MD;  Location: [place];  Service: Orthopedics;  Laterality: Left;  . TOTAL KNEE ARTHROPLASTY  04/05/2012   Procedure: TOTAL KNEE ARTHROPLASTY;  Surgeon: Lorn Junes, MD;  Location: [place];  Service: Orthopedics;   Laterality: Right;  DR [place] THIS CASE  . TOTAL KNEE ARTHROPLASTY  08/02/2012   Procedure: TOTAL KNEE ARTHROPLASTY;  Surgeon: Lorn Junes, MD;  Location: [place];  Service: Orthopedics;  Laterality: Left;    Home Medications:  Allergies as of 06/09/2017   No Known Allergies     Medication List       Accurate as of 06/09/17 10:26 AM. Always use your most recent med list.          aspirin EC 81 MG tablet Take 81 mg by mouth daily.   atorvastatin 10 MG tablet Commonly known as:  LIPITOR Take 10 mg by mouth daily.   fexofenadine 180 MG tablet Commonly known as:  ALLEGRA Take 180 mg by mouth daily. For allergies.   lisinopril-hydrochlorothiazide 20-25 MG tablet Commonly known as:  PRINZIDE,ZESTORETIC Take 1 tablet by mouth daily.   meloxicam 15 MG tablet Commonly known as:  MOBIC Take 15 mg by mouth daily.       Allergies: No Known Allergies  Family History: Family History  Problem Relation Age of Onset  . Cerebral aneurysm Mother   . Hypertension Mother   . Hypertension Sister   . Hypertension Brother   . Prostate cancer Neg Hx     Social History:  reports that he quit smoking about  8 years ago. His smoking use included Cigarettes. He has a 14.52 pack-year smoking history. He has never used smokeless tobacco. He reports that he drinks about 0.6 oz of alcohol per week . He reports that he does not use drugs.  ROS: UROLOGY Frequent Urination?: No Hard to postpone urination?: No Burning/pain with urination?: No Get up at night to urinate?: No Leakage of urine?: No Urine stream starts and stops?: No Trouble starting stream?: No Do you have to strain to urinate?: No Blood in urine?: No Urinary tract infection?: No Sexually transmitted disease?: No Injury to kidneys or bladder?: No Painful intercourse?: No Weak stream?: No Erection problems?: No Penile pain?: No  Gastrointestinal Nausea?: No Vomiting?: No Indigestion/heartburn?:  No Diarrhea?: No Constipation?: No  Constitutional Fever: No Night sweats?: No Weight loss?: No Fatigue?: No  Skin Skin rash/lesions?: No Itching?: No  Eyes Blurred vision?: No Double vision?: No  Ears/Nose/Throat Sore throat?: No Sinus problems?: No  Hematologic/Lymphatic Swollen glands?: No Easy bruising?: No  Cardiovascular Leg swelling?: No Chest pain?: No  Respiratory Cough?: No Shortness of breath?: No  Endocrine Excessive thirst?: No  Musculoskeletal Back pain?: No Joint pain?: No  Neurological Headaches?: No Dizziness?: No  Psychologic Depression?: No Anxiety?: No  Physical Exam: BP 136/81 (BP Location: Left Arm, Patient Position: Sitting, Cuff Size: Normal)   Pulse 80   Ht 5\' 8"  (1.727 m)   Wt 84.4 kg (186 lb)   BMI 28.28 kg/m   Constitutional:  Alert and oriented, No acute distress. HEENT: [place] AT, moist mucus membranes.  Trachea midline, no masses. Cardiovascular: No clubbing, cyanosis, or edema. Respiratory: Normal respiratory effort, no increased work of breathing. GI: Abdomen is soft, nontender, nondistended, no abdominal masses GU: No CVA tenderness. DRE: Performed on 2/18: Plus 2, smooth, symmetric, no nodules Skin: No rashes, bruises or suspicious lesions. Lymph: No cervical or inguinal adenopathy. Neurologic: Grossly intact, no focal deficits, moving all 4 extremities. Psychiatric: Normal mood and affect.  Laboratory Data: Lab Results  Component Value Date   WBC 5.5 03/23/2015   HGB 14.6 03/23/2015   HCT 44.3 03/23/2015   MCV 87.5 03/23/2015   PLT 222 03/23/2015    Lab Results  Component Value Date   CREATININE 1.03 03/23/2015    No results found for: PSA  No results found for: TESTOSTERONE  No results found for: HGBA1C  Urinalysis    Component Value Date/Time   COLORURINE YELLOW 03/23/2015 [place] 03/23/2015 1323   LABSPEC 1.019 03/23/2015 1323   PHURINE 5.0 03/23/2015 1323   GLUCOSEU  NEGATIVE 03/23/2015 1323   HGBUR NEGATIVE 03/23/2015 1323   [place] 03/23/2015 1323   KETONESUR NEGATIVE 03/23/2015 1323   PROTEINUR NEGATIVE 03/23/2015 1323   UROBILINOGEN 0.2 03/23/2015 1323   NITRITE NEGATIVE 03/23/2015 1323   LEUKOCYTESUR NEGATIVE 03/23/2015 1323    Pertinent Imaging: None  Assessment & Plan:  The patient's PSA continues to rise. We discussed management options, and I recommended he consider a prostate biopsy. However the details of the biopsy, explained to him side effects of hematuria and blood per rectum. We also discussed the risk of infection. The patient would like to proceed with prostate biopsy.  The patient does not take his sildenafil correctly, I refilled his prescription and gone over the proper dosage and usage within.  The patient will return for prostate biopsy.  There are no diagnoses linked to this encounter.  No Follow-up on file.  Ardis Hughs, MD  [facility] [address], [place], [place] [address] [phone]

## 2017-06-22 ENCOUNTER — Encounter: Payer: Self-pay | Admitting: Urology

## 2017-06-22 ENCOUNTER — Ambulatory Visit: Payer: Medicare Other | Admitting: Urology

## 2017-06-22 ENCOUNTER — Other Ambulatory Visit: Payer: Self-pay | Admitting: Urology

## 2017-06-22 VITALS — BP 111/73 | HR 78 | Ht 68.0 in | Wt 180.0 lb

## 2017-06-22 DIAGNOSIS — R972 Elevated prostate specific antigen [PSA]: Secondary | ICD-10-CM

## 2017-06-22 MED ORDER — GENTAMICIN SULFATE 40 MG/ML IJ SOLN
80.0000 mg | Freq: Once | INTRAMUSCULAR | Status: AC
  Administered 2017-06-22: 80 mg via INTRAMUSCULAR

## 2017-06-22 MED ORDER — LIDOCAINE HCL 2 % EX GEL
1.0000 "application " | Freq: Once | CUTANEOUS | Status: AC
  Administered 2017-06-22: 1 via URETHRAL

## 2017-06-22 MED ORDER — LEVOFLOXACIN 500 MG PO TABS
500.0000 mg | ORAL_TABLET | Freq: Once | ORAL | Status: AC
  Administered 2017-06-22: 500 mg via ORAL

## 2017-06-22 NOTE — Progress Notes (Signed)
Prostate Biopsy Procedure   Informed consent was obtained after discussing risks/benefits of the procedure.  A time out was performed to ensure correct patient identity.  Pre-Procedure: - Last PSA Level: No results found for: PSA - Gentamicin given prophylactically - Levaquin 500 mg administered PO -Transrectal Ultrasound performed revealing a 75 gm prostate -No significant hypoechoic or median lobe noted  Procedure: - Prostate block performed using 10 cc 1% lidocaine and biopsies taken from sextant areas, a total of 12 under ultrasound guidance.  Post-Procedure: - Patient tolerated the procedure well - He was counseled to seek immediate medical attention if experiences any severe pain, significant bleeding, or fevers - Return in one week to discuss biopsy results

## 2017-06-24 ENCOUNTER — Telehealth: Payer: Self-pay | Admitting: Urology

## 2017-06-24 NOTE — Telephone Encounter (Signed)
Patient called the office to inquire about starting his aspirin and his meloxicam after his biopsy on 9/10.  Patient is not experiencing significant bleeding at this time.  Per Amy, patient can resume his medications as prescribed.  Patient advised and expressed understanding.

## 2017-06-25 ENCOUNTER — Other Ambulatory Visit: Payer: Self-pay | Admitting: Urology

## 2017-06-25 LAB — PATHOLOGY REPORT

## 2017-07-06 ENCOUNTER — Ambulatory Visit (INDEPENDENT_AMBULATORY_CARE_PROVIDER_SITE_OTHER): Payer: Medicare Other | Admitting: Urology

## 2017-07-06 ENCOUNTER — Encounter: Payer: Self-pay | Admitting: Urology

## 2017-07-06 VITALS — BP 107/68 | HR 76 | Ht 68.0 in | Wt 180.0 lb

## 2017-07-06 DIAGNOSIS — C61 Malignant neoplasm of prostate: Secondary | ICD-10-CM

## 2017-07-06 NOTE — Progress Notes (Signed)
07/06/2017 11:38 AM   Andre Jackson 1946/01/22 [id number]  Referring provider: Madelyn Brunner, MD [facility] [place], [place] [address]  No chief complaint on file.   HPI:  Follow-up to discuss biopsy results.  1) early intermediate risk prostate cancer - September 2018 PSA 4.7 Prostate 75 g T1c - prostate reported as normal February 2018 Gleason 3+3 = 6, left base, 20% Gleason 3+4 = 7, right base, 2%-the proportion of Gleason grade 4 could not be accurately assessed in this small tumor focus.   IPSS: 2, QOL 0 SHIM: 12 -- atient tried sildenafil, 40 mg, unsuccessfully.  Patient returns today and has been well.   PMH: Past Medical History:  Diagnosis Date  . Arthritis    "knees, hips" (04/03/2015)  . Cataract immature   . Diverticulosis   . Hx of colonic polyps   . Hyperlipidemia    takes Lipitor daily  . Hypertension    takes Prinizide daily  . Joint pain   . Joint swelling   . Left knee DJD   . Seasonal allergies    takes Allegra daily    Surgical History: Past Surgical History:  Procedure Laterality Date  . BACK SURGERY    . COLONOSCOPY W/ BIOPSIES AND POLYPECTOMY  07/2011   "have had it once before that also"  . I&D EXTREMITY Left ~ 2010   knee  . JOINT REPLACEMENT    . KNEE ARTHROSCOPY Left ~ 2011  . LIPOMA EXCISION  07/2011   neck  . LUMBAR DISC SURGERY  1970's  . MULTIPLE TOOTH EXTRACTIONS  ~ 2007  . 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 TONSILLECTOMY AND ADENOIDECTOMY  1940's  . TOTAL HIP ARTHROPLASTY Left 04/03/2015  . TOTAL HIP ARTHROPLASTY Left 04/03/2015   Procedure: TOTAL HIP ARTHROPLASTY ANTERIOR APPROACH;  Surgeon: Renette Butters, MD;  Location: [place];  Service: Orthopedics;  Laterality: Left;  . TOTAL KNEE ARTHROPLASTY  04/05/2012   Procedure: TOTAL KNEE ARTHROPLASTY;  Surgeon: Lorn Junes, MD;  Location: [place];  Service: Orthopedics;  Laterality: Right;  DR [place] THIS  CASE  . TOTAL KNEE ARTHROPLASTY  08/02/2012   Procedure: TOTAL KNEE ARTHROPLASTY;  Surgeon: Lorn Junes, MD;  Location: [place];  Service: Orthopedics;  Laterality: Left;    Home Medications:  Allergies as of 07/06/2017   No Known Allergies     Medication List       Accurate as of 07/06/17 11:38 AM. Always use your most recent med list.          aspirin EC 81 MG tablet Take 81 mg by mouth daily.   atorvastatin 10 MG tablet Commonly known as:  LIPITOR Take 10 mg by mouth daily.   fexofenadine 180 MG tablet Commonly known as:  ALLEGRA Take 180 mg by mouth daily. For allergies.   lisinopril-hydrochlorothiazide 20-25 MG tablet Commonly known as:  PRINZIDE,ZESTORETIC Take 1 tablet by mouth daily.   meloxicam 15 MG tablet Commonly known as:  MOBIC Take 15 mg by mouth daily.   sildenafil 20 MG tablet Commonly known as:  REVATIO Take 2-5 tablets (40-100 mg total) by mouth daily as needed.       Allergies: No Known Allergies  Family History: Family History  Problem Relation Age of Onset  . Cerebral aneurysm Mother   . Hypertension Mother   . Hypertension Sister   . Hypertension Brother   . Prostate cancer Neg Hx  Social History:  reports that he quit smoking about 8 years ago. His smoking use included Cigarettes. He has a 14.52 pack-year smoking history. He has never used smokeless tobacco. He reports that he drinks about 0.6 oz of alcohol per week . He reports that he does not use drugs.  ROS: UROLOGY Frequent Urination?: No Hard to postpone urination?: No Burning/pain with urination?: No Get up at night to urinate?: No Leakage of urine?: No Urine stream starts and stops?: No Trouble starting stream?: No Do you have to strain to urinate?: No Blood in urine?: No Urinary tract infection?: No Sexually transmitted disease?: No Injury to kidneys or bladder?: No Painful intercourse?: No Weak stream?: No Erection problems?: No Penile pain?:  No  Gastrointestinal Nausea?: No Vomiting?: No Indigestion/heartburn?: No Diarrhea?: No Constipation?: No  Constitutional Fever: No Night sweats?: No Weight loss?: No Fatigue?: No  Skin Skin rash/lesions?: No Itching?: No  Eyes Blurred vision?: No Double vision?: No  Ears/Nose/Throat Sore throat?: No Sinus problems?: No  Hematologic/Lymphatic Swollen glands?: No Easy bruising?: No  Cardiovascular Leg swelling?: No Chest pain?: No  Respiratory Cough?: No Shortness of breath?: No  Endocrine Excessive thirst?: No  Musculoskeletal Back pain?: No Joint pain?: No  Neurological Headaches?: No Dizziness?: No  Psychologic Depression?: No Anxiety?: No  Physical Exam: BP 107/68   Pulse 76   Ht 5\' 8"  (1.727 m)   Wt 81.6 kg (180 lb)   BMI 27.37 kg/m   Constitutional:  Alert and oriented, No acute distress. HEENT: [place] AT, moist mucus membranes.  Trachea midline, no masses. Cardiovascular: No clubbing, cyanosis, or edema. Respiratory: Normal respiratory effort, no increased work of breathing. GI: Abdomen is soft, nontender, nondistended, no abdominal masses Skin: No rashes, bruises or suspicious lesions. Neurologic: Grossly intact, no focal deficits, moving all 4 extremities. Psychiatric: Normal mood and affect.  Laboratory Data: Lab Results  Component Value Date   WBC 5.5 03/23/2015   HGB 14.6 03/23/2015   HCT 44.3 03/23/2015   MCV 87.5 03/23/2015   PLT 222 03/23/2015    Lab Results  Component Value Date   CREATININE 1.03 03/23/2015    No results found for: PSA  No results found for: TESTOSTERONE  No results found for: HGBA1C  Urinalysis    Component Value Date/Time   COLORURINE YELLOW 03/23/2015 [place] 03/23/2015 1323   LABSPEC 1.019 03/23/2015 1323   PHURINE 5.0 03/23/2015 1323   GLUCOSEU NEGATIVE 03/23/2015 1323   HGBUR NEGATIVE 03/23/2015 1323   BILIRUBINUR NEGATIVE 03/23/2015 1323   KETONESUR NEGATIVE  03/23/2015 1323   PROTEINUR NEGATIVE 03/23/2015 1323   UROBILINOGEN 0.2 03/23/2015 1323   NITRITE NEGATIVE 03/23/2015 1323   LEUKOCYTESUR NEGATIVE 03/23/2015 1323     Assessment & Plan:   Early intermediate risk prostate cancer-I had a long discussion with the patient using the 100 questions book and his path report. We discussed his stage, grade and prognosis and the relevant anatomy. We discussed the nature risks and benefits of active surveillance, radical prostatectomy, IMRT (+/- brachytherapy, +/- ADT). We discussed specifically how each treatment might affect the bowel, bladder and sexual function and how each treatment might effect salvage treatments. We discussed the role of other modalities in the treatment of prostate cancer including chemotherapy, HIFU and cryotherapy. We discussed risk of prostate cancer progressing from curable to incurable while on active surveillance is low but something we will monitor closely. A follow-up PSA, DRE and MRI would be a good strategy in this  patient. With a low PSA density and a small lesion on biopsy we will commit to active surveillance. Follow-up MRI would be a good option for the patient and in 3-6 months.   No Follow-up on file.  Festus Aloe, [facility] [address] [place], [place] [address] [phone]

## 2017-09-24 ENCOUNTER — Other Ambulatory Visit: Payer: Medicare Other

## 2017-09-24 DIAGNOSIS — C61 Malignant neoplasm of prostate: Secondary | ICD-10-CM

## 2017-09-25 LAB — PSA: Prostate Specific Ag, Serum: 3.6 ng/mL (ref 0.0–4.0)

## 2017-09-28 ENCOUNTER — Ambulatory Visit: Payer: Medicare Other | Admitting: Urology

## 2017-09-28 ENCOUNTER — Encounter: Payer: Self-pay | Admitting: Urology

## 2017-09-28 VITALS — BP 138/82 | HR 92 | Ht 68.0 in | Wt 186.8 lb

## 2017-09-28 DIAGNOSIS — C61 Malignant neoplasm of prostate: Secondary | ICD-10-CM

## 2017-09-28 NOTE — Progress Notes (Signed)
09/28/2017 10:28 AM   Andre Jackson 1946/03/29 [id number]  Referring provider: Madelyn Brunner, MD [facility] [place], [place] [address]  Chief Complaint  Patient presents with  . 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   IPSS: 2, QOL 0 SHIM: 12 -- patient tried sildenafil, 40 mg, unsuccessfully.  He returns and PSA is lower at 3.6. He's had no dysuria or gross hematuria. No nocturia.      PMH: Past Medical History:  Diagnosis Date  . Arthritis    "knees, hips" (04/03/2015)  . Cataract immature   . Diverticulosis   . Hx of colonic polyps   . Hyperlipidemia    takes Lipitor daily  . Hypertension    takes Prinizide daily  . Joint pain   . Joint swelling   . Left knee DJD   . Seasonal allergies    takes Allegra daily    Surgical History: Past Surgical History:  Procedure Laterality Date  . BACK SURGERY    . COLONOSCOPY W/ BIOPSIES AND POLYPECTOMY  07/2011   "have had it once before that also"  . I&D EXTREMITY Left ~ 2010   knee  . JOINT REPLACEMENT    . KNEE ARTHROSCOPY Left ~ 2011  . LIPOMA EXCISION  07/2011   neck  . LUMBAR DISC SURGERY  1970's  . MULTIPLE TOOTH EXTRACTIONS  ~ 2007  . 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 TONSILLECTOMY AND ADENOIDECTOMY  1940's  . TOTAL HIP ARTHROPLASTY Left 04/03/2015  . TOTAL HIP ARTHROPLASTY Left 04/03/2015   Procedure: TOTAL HIP ARTHROPLASTY ANTERIOR APPROACH;  Surgeon: Renette Butters, MD;  Location: [place];  Service: Orthopedics;  Laterality: Left;  . TOTAL KNEE ARTHROPLASTY  04/05/2012   Procedure: TOTAL KNEE ARTHROPLASTY;  Surgeon: Lorn Junes, MD;  Location: [facility];  Service: Orthopedics;   Laterality: Right;  DR [place] THIS CASE  . TOTAL KNEE ARTHROPLASTY  08/02/2012   Procedure: TOTAL KNEE ARTHROPLASTY;  Surgeon: Lorn Junes, MD;  Location: [place];  Service: Orthopedics;  Laterality: Left;    Home Medications:  Allergies as of 09/28/2017   No Known Allergies     Medication List        Accurate as of 09/28/17 10:28 AM. Always use your most recent med list.          aspirin EC 81 MG tablet Take 81 mg by mouth daily.   atorvastatin 10 MG tablet Commonly known as:  LIPITOR Take 10 mg by mouth daily.   fexofenadine 180 MG tablet Commonly known as:  ALLEGRA Take 180 mg by mouth daily. For allergies.   lisinopril-hydrochlorothiazide 20-25 MG tablet Commonly known as:  PRINZIDE,ZESTORETIC Take 1 tablet by mouth daily.   meloxicam 15 MG tablet Commonly known as:  MOBIC Take 15 mg by mouth daily.   sildenafil 20 MG tablet Commonly known as:  REVATIO Take 2-5 tablets (40-100 mg total) by mouth daily as needed.       Allergies: No Known Allergies  Family History: Family History  Problem Relation Age of Onset  . Cerebral aneurysm Mother   . Hypertension Mother   .  Hypertension Sister   . Hypertension Brother   . Prostate cancer Neg Hx     Social History:  reports that he quit smoking about 8 years ago. His smoking use included cigarettes. He has a 14.52 pack-year smoking history. he has never used smokeless tobacco. He reports that he drinks about 0.6 oz of alcohol per week. He reports that he does not use drugs.  ROS: UROLOGY Frequent Urination?: No Hard to postpone urination?: No Burning/pain with urination?: No Get up at night to urinate?: No Leakage of urine?: No Urine stream starts and stops?: No Trouble starting stream?: No Do you have to strain to urinate?: No Blood in urine?: No Urinary tract infection?: No Sexually transmitted disease?: No Injury to kidneys or bladder?: No Painful intercourse?: No Weak  stream?: No Erection problems?: No Penile pain?: No  Gastrointestinal Nausea?: No Vomiting?: No Indigestion/heartburn?: No Diarrhea?: No Constipation?: No  Constitutional Fever: No Night sweats?: No Weight loss?: No Fatigue?: No  Skin Skin rash/lesions?: No Itching?: No  Eyes Blurred vision?: No Double vision?: No  Ears/Nose/Throat Sore throat?: No Sinus problems?: No  Hematologic/Lymphatic Swollen glands?: No Easy bruising?: No  Cardiovascular Leg swelling?: No Chest pain?: No  Respiratory Cough?: No Shortness of breath?: No  Endocrine Excessive thirst?: No  Musculoskeletal Back pain?: No Joint pain?: No  Neurological Headaches?: No Dizziness?: No  Psychologic Depression?: No Anxiety?: No  Physical Exam: BP 138/82 (BP Location: Right Arm, Patient Position: Sitting, Cuff Size: Normal)   Pulse 92   Ht 5\' 8"  (1.727 m)   Wt 84.7 kg (186 lb 12.8 oz)   BMI 28.40 kg/m   Constitutional:  Alert and oriented, No acute distress. HEENT: [place] AT, moist mucus membranes.  Trachea midline, no masses. Cardiovascular: No clubbing, cyanosis, or edema. Respiratory: Normal respiratory effort, no increased work of breathing. GI: Abdomen is soft, nontender, nondistended, no abdominal masses Skin: No rashes, bruises or suspicious lesions. Neurologic: Grossly intact, no focal deficits, moving all 4 extremities. Psychiatric: Normal mood and affect.  Laboratory Data: Lab Results  Component Value Date   WBC 5.5 03/23/2015   HGB 14.6 03/23/2015   HCT 44.3 03/23/2015   MCV 87.5 03/23/2015   PLT 222 03/23/2015    Lab Results  Component Value Date   CREATININE 1.03 03/23/2015    Lab Results  Component Value Date   PSA1 3.6 09/24/2017   PSA1 4.7 (H) 06/05/2017   PSA1 3.8 11/17/2016    No results found for: TESTOSTERONE  No results found for: HGBA1C  Urinalysis Lab Results  Component Value Date   APPEARANCEUR CLEAR 03/23/2015   LEUKOCYTESUR NEGATIVE  03/23/2015   PROTEINUR NEGATIVE 03/23/2015   GLUCOSEU NEGATIVE 03/23/2015   BILIRUBINUR NEGATIVE 03/23/2015   NITRITE NEGATIVE 03/23/2015    No results found for: LABMICR, [place], RBCUA, LABEPIT, MUCUS, BACTERIA  Results for orders placed during the hospital encounter of 10/03/15  US Renal   Narrative CLINICAL DATA:  Microscopic hematuria found 2-3 weeks ago, no flank pain.  EXAM: RENAL / URINARY TRACT ULTRASOUND COMPLETE  COMPARISON:  None.  FINDINGS: Right Kidney:  Length: 11.8 cm. Echogenicity within normal limits. There is a lower pole nonobstructing shadowing stone measuring 1.5 cm in greatest dimension. No mass or hydronephrosis visualized.  Left Kidney:  Length: 9.7 cm. Echogenicity within normal limits. No mass or hydronephrosis visualized.  Bladder:  The prostate gland is enlarged and produces a moderate impression upon the urinary bladder base. The prostate gland measures 5.6 x 5.9 x 5.9  cm. Bilateral ureteral jets are observed in the bladder. The bladder itself appears normal with exception of the basilar prostatic impression.  IMPRESSION: 1.5 cm nonobstructing stone in the lower pole of the right kidney. Enlargement of the prostate gland which produces a moderate impression upon the urinary bladder base.   Electronically Signed   By: David  Martinique M.D.   On: 10/03/2015 13:52    No results found for this or any previous visit. No results found for this or any previous visit. No results found for this or any previous visit.  Assessment & Plan:   Prostate cancer - discussed again nature r/b of surveillance, surgery and XRT. All questions answered. F/u 6 mo with PSA prior.   There are no diagnoses linked to this encounter.  No Follow-up on file.  Festus Aloe, MD  [facility] [address], [place], [place] [address] [phone]

## 2017-09-30 DIAGNOSIS — Z8546 Personal history of malignant neoplasm of prostate: Secondary | ICD-10-CM | POA: Insufficient documentation

## 2017-10-30 ENCOUNTER — Other Ambulatory Visit: Payer: Self-pay | Admitting: Orthopaedic Surgery

## 2017-10-30 DIAGNOSIS — M25512 Pain in left shoulder: Secondary | ICD-10-CM

## 2017-11-04 ENCOUNTER — Ambulatory Visit
Admission: RE | Admit: 2017-11-04 | Discharge: 2017-11-04 | Disposition: A | Discharge status: Home / Self Care | Payer: Medicare HMO | Source: Ambulatory Visit | Attending: Orthopaedic Surgery | Admitting: Orthopaedic Surgery

## 2017-11-04 DIAGNOSIS — M25512 Pain in left shoulder: Secondary | ICD-10-CM

## 2017-11-18 NOTE — H&P (Signed)
PREOPERATIVE H&P  Chief Complaint: djd left shoulder  HPI: Andre Jackson is a 72 y.o. male who presents for preoperative history and physical with a diagnosis of djd left shoulder. Symptoms are rated as moderate to severe, and have been worsening.  This is significantly impairing activities of daily living.  He has elected for surgical management.   Past Medical History:  Diagnosis Date  . Arthritis    "knees, hips" (04/03/2015)  . Cataract immature   . Diverticulosis   . Hx of colonic polyps   . Hyperlipidemia    takes Lipitor daily  . Hypertension    takes Prinizide daily  . Joint pain   . Joint swelling   . Left knee DJD   . Seasonal allergies    takes Allegra daily   Past Surgical History:  Procedure Laterality Date  . BACK SURGERY    . COLONOSCOPY W/ BIOPSIES AND POLYPECTOMY  07/2011   "have had it once before that also"  . I&D EXTREMITY Left ~ 2010   knee  . JOINT REPLACEMENT    . KNEE ARTHROSCOPY Left ~ 2011  . LIPOMA EXCISION  07/2011   neck  . LUMBAR DISC SURGERY  1970's  . MULTIPLE TOOTH EXTRACTIONS  ~ 2007  . 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 TONSILLECTOMY AND ADENOIDECTOMY  1940's  . TOTAL HIP ARTHROPLASTY Left 04/03/2015  . TOTAL HIP ARTHROPLASTY Left 04/03/2015   Procedure: TOTAL HIP ARTHROPLASTY ANTERIOR APPROACH;  Surgeon: Renette Butters, MD;  Location: [facility];  Service: Orthopedics;  Laterality: Left;  . TOTAL KNEE ARTHROPLASTY  04/05/2012   Procedure: TOTAL KNEE ARTHROPLASTY;  Surgeon: Lorn Junes, MD;  Location: [facility];  Service: Orthopedics;  Laterality: Right;  DR [place] THIS CASE  . TOTAL KNEE ARTHROPLASTY  08/02/2012   Procedure: TOTAL KNEE ARTHROPLASTY;  Surgeon: Lorn Junes, MD;  Location: [facility];  Service: Orthopedics;  Laterality: Left;   Social History   Socioeconomic History  . Marital status: Married    Spouse name: Not on file  . Number of children: Not on file  . Years of education: Not on file  .  Highest education level: Not on file  Social Needs  . Financial resource strain: Not on file  . Food insecurity - worry: Not on file  . Food insecurity - inability: Not on file  . Transportation needs - medical: Not on file  . Transportation needs - non-medical: Not on file  Occupational History  . Not on file  Tobacco Use  . Smoking status: Former Smoker    Packs/day: 0.33    Years: 44.00    Pack years: 14.52    Types: Cigarettes    Last attempt to quit: 02/10/2009    Years since quitting: 8.7  . Smokeless tobacco: Never Used  Substance and Sexual Activity  . Alcohol use: Yes    Alcohol/week: 0.6 oz    Types: 1 Cans of beer per week  . Drug use: No  . Sexual activity: Yes  Other Topics Concern  . Not on file  Social History Narrative  . Not on file   Family History  Problem Relation Age of Onset  . Cerebral aneurysm Mother   . Hypertension Mother   . Hypertension Sister   . Hypertension Brother   . Prostate cancer Neg Hx    No Known Allergies Prior to Admission medications   Medication Sig Start Date End Date Taking? Authorizing Provider  acetaminophen (TYLENOL) 650 MG CR tablet Take 650 mg by mouth every 8 (eight) hours as needed for pain.   Yes [provider]  aspirin EC 81 MG tablet Take 81 mg by mouth daily.   Yes [provider]  atorvastatin (LIPITOR) 10 MG tablet Take 10 mg by mouth daily.   Yes [provider]  lisinopril-hydrochlorothiazide (PRINZIDE,ZESTORETIC) 20-25 MG per tablet Take 1 tablet by mouth daily.   Yes [provider]  meloxicam (MOBIC) 15 MG tablet Take 15 mg by mouth daily.   Yes [provider]  naproxen sodium (ALEVE) 220 MG tablet Take 220 mg by mouth daily as needed (for pain or headache).   Yes [provider]  sildenafil (REVATIO) 20 MG tablet Take 2-5 tablets (40-100 mg total) by mouth daily as needed. Patient taking differently: Take 40-100 mg by mouth daily as needed (for ED).   06/09/17   Ardis Hughs, MD     Positive ROS: All other systems have been reviewed and were otherwise negative with the exception of those mentioned in the HPI and as above.  Physical Exam: General: Alert, no acute distress Cardiovascular: No pedal edema Respiratory: No cyanosis, no use of accessory musculature GI: No organomegaly, abdomen is soft and non-tender Skin: No lesions in the area of chief complaint Neurologic: Sensation intact distally Psychiatric: Patient is competent for consent with normal mood and affect Lymphatic: No axillary or cervical lymphadenopathy  MUSCULOSKELETAL: L shoulder: limited ROM, 4/5 cuff strength, axillary neve firing  Assessment: djd left shoulder  Plan: Plan for Procedure(s): TOTAL SHOULDER ARTHROPLASTY  The risks benefits and alternatives were discussed with the patient including but not limited to the risks of nonoperative treatment, versus surgical intervention including infection, bleeding, nerve injury,  blood clots, cardiopulmonary complications, morbidity, mortality, among others, and they were willing to proceed.   Andre Gash, MD  11/18/2017 4:43 PM

## 2017-11-19 NOTE — Pre-Procedure Instructions (Signed)
Andre Jackson  11/19/2017      [id number] - [place], [place] - [address] [place] [address] Phone: [phone] Fax: [phone]    Your procedure is scheduled on 12/02/2017.  Report to [facility] Admitting at [email or web address].M.  Call this number if you have problems the morning of surgery:  [phone]   Remember:  Do not eat food or drink liquids after midnight.   Continue all medications as directed by your physician except follow these medication instructions before surgery below   Take these medicines the morning of surgery with A SIP OF WATER: Acetaminophen (Tylenol) - if needed for pain  7 days prior to surgery STOP taking any Meloxicam (Mobic), Aspirin (unless otherwise instructed by your surgeon), Aleve, Naproxen, Ibuprofen, Motrin, Advil, Goody's, BC's, all herbal medications, fish oil, and all vitamins     Do not wear jewelry.  Do not wear lotions, powders, or colognes, or deodorant.  Men may shave face and neck.  Do not bring valuables to the hospital.  [facility] is not responsible for any belongings or valuables.  Hearing aids, eyeglasses, contacts, dentures or bridgework may not be worn into surgery.  Leave your suitcase in the car.  After surgery it may be brought to your room.  For patients admitted to the hospital, discharge time will be determined by your treatment team.  Patients discharged the day of surgery will not be allowed to drive home.   Name and phone number of your driver:    Special instructions:   [place]- Preparing For Surgery  Before surgery, you can play an important role. Because skin is not sterile, your skin needs to be as free of germs as possible. You can reduce the number of germs on your skin by washing with CHG (chlorahexidine gluconate) Soap before surgery.  CHG is an antiseptic cleaner which kills germs and bonds with the skin to continue killing germs even after  washing.  Please do not use if you have an allergy to CHG or antibacterial soaps. If your skin becomes reddened/irritated stop using the CHG.  Do not shave (including legs and underarms) for at least 48 hours prior to first CHG shower. It is OK to shave your face.  Please follow these instructions carefully.   1. Shower the NIGHT BEFORE SURGERY and the MORNING OF SURGERY with CHG.   2. If you chose to wash your hair, wash your hair first as usual with your normal shampoo.  3. After you shampoo, rinse your hair and body thoroughly to remove the shampoo.  4. Use CHG as you would any other liquid soap. You can apply CHG directly to the skin and wash gently with a scrungie or a clean washcloth.   5. Apply the CHG Soap to your body ONLY FROM THE NECK DOWN.  Do not use on open wounds or open sores. Avoid contact with your eyes, ears, mouth and genitals (private parts). Wash Face and genitals (private parts)  with your normal soap.  6. Wash thoroughly, paying special attention to the area where your surgery will be performed.  7. Thoroughly rinse your body with warm water from the neck down.  8. DO NOT shower/wash with your normal soap after using and rinsing off the CHG Soap.  9. Pat yourself dry with a CLEAN TOWEL.  10. Wear CLEAN PAJAMAS to bed the night before surgery, wear comfortable clothes the morning of surgery  11. Place CLEAN SHEETS on your bed the night of your first shower and DO NOT SLEEP WITH PETS.    Day of Surgery: Shower as stated above. Do not apply any deodorants/lotions.  Please wear clean clothes to the hospital/surgery center.      Please read over the following fact sheets that you were given.

## 2017-11-20 ENCOUNTER — Encounter (HOSPITAL_COMMUNITY)
Admission: RE | Admit: 2017-11-20 | Discharge: 2017-11-20 | Disposition: A | Discharge status: Home / Self Care | Payer: Medicare HMO | Source: Ambulatory Visit | Attending: Orthopaedic Surgery | Admitting: Orthopaedic Surgery

## 2017-11-20 ENCOUNTER — Other Ambulatory Visit: Payer: Self-pay

## 2017-11-20 ENCOUNTER — Encounter (HOSPITAL_COMMUNITY): Payer: Self-pay

## 2017-11-20 DIAGNOSIS — Z01812 Encounter for preprocedural laboratory examination: Secondary | ICD-10-CM | POA: Diagnosis not present

## 2017-11-20 LAB — CBC
HCT: 44.4 % (ref 39.0–52.0)
Hemoglobin: 14.8 g/dL (ref 13.0–17.0)
MCH: 29.9 pg (ref 26.0–34.0)
MCHC: 33.3 g/dL (ref 30.0–36.0)
MCV: 89.7 fL (ref 78.0–100.0)
PLATELETS: 255 10*3/uL (ref 150–400)
RBC: 4.95 MIL/uL (ref 4.22–5.81)
RDW: 14 % (ref 11.5–15.5)
WBC: 7 10*3/uL (ref 4.0–10.5)

## 2017-11-20 LAB — BASIC METABOLIC PANEL
Anion gap: 12 (ref 5–15)
BUN: 17 mg/dL (ref 6–20)
CO2: 26 mmol/L (ref 22–32)
Calcium: 9.3 mg/dL (ref 8.9–10.3)
Chloride: 101 mmol/L (ref 101–111)
Creatinine, Ser: 1.09 mg/dL (ref 0.61–1.24)
GFR calc Af Amer: >60 mL/min (ref >60)
GFR calc non Af Amer: >60 mL/min (ref >60)
Glucose, Bld: 117 mg/dL — ABNORMAL HIGH (ref 65–99)
Potassium: 4.1 mmol/L (ref 3.5–5.1)
SODIUM: 139 mmol/L (ref 135–145)

## 2017-11-20 LAB — SURGICAL PCR SCREEN
MRSA, PCR: NEGATIVE
Staphylococcus aureus: NEGATIVE

## 2017-11-20 NOTE — Progress Notes (Signed)
PCP - Dr. Harrel Lemon at [facility]; Medical clearance in [place] 11/06/2017 Cardiologist - patient denies  Chest x-ray - n/a EKG - 11/06/2017; requested tracing from Dr. Edwina Barth  Stress Test - patient denies ECHO - patient denies Cardiac Cath - patient denies  Sleep Study - patient denies  Anesthesia review: n/a  Patient denies shortness of breath, fever, cough and chest pain at PAT appointment   Patient verbalized understanding of instructions that were given to them at the PAT appointment. Patient was also instructed that they will need to review over the PAT instructions again at home before surgery.

## 2017-12-01 MED ORDER — TRANEXAMIC ACID 1000 MG/10ML IV SOLN
1000.0000 mg | INTRAVENOUS | Status: AC
  Administered 2017-12-02: 1000 mg via INTRAVENOUS
  Filled 2017-12-01: qty 1.1

## 2017-12-01 NOTE — Anesthesia Preprocedure Evaluation (Addendum)
Anesthesia Evaluation  Patient identified by MRN, date of birth, ID band Patient awake    Reviewed: Allergy & Precautions, NPO status , Patient's Chart, lab work & pertinent test results  Airway Mallampati: II  TM Distance: >3 FB Neck ROM: Full    Dental  (+) Partial Lower, Partial Upper, Dental Advisory Given   Pulmonary former smoker,    Pulmonary exam normal breath sounds clear to auscultation       Cardiovascular hypertension, Pt. on medications Normal cardiovascular exam Rhythm:Regular Rate:Normal     Neuro/Psych negative neurological ROS  negative psych ROS   GI/Hepatic negative GI ROS, Neg liver ROS,   Endo/Other  negative endocrine ROS  Renal/GU negative Renal ROS  negative genitourinary   Musculoskeletal  (+) Arthritis ,   Abdominal   Peds  Hematology negative hematology ROS (+)   Anesthesia Other Findings   Reproductive/Obstetrics                            Anesthesia Physical  Anesthesia Plan  ASA: II  Anesthesia Plan: General   Post-op Pain Management:  Regional for Post-op pain   Induction: Intravenous  PONV Risk Score and Plan: 2 and Ondansetron, Dexamethasone and Treatment may vary due to age or medical condition  Airway Management Planned: Oral ETT  Additional Equipment: None  Intra-op Plan:   Post-operative Plan: Extubation in OR  Informed Consent: I have reviewed the patients History and Physical, chart, labs and discussed the procedure including the risks, benefits and alternatives for the proposed anesthesia with the patient or authorized representative who has indicated his/her understanding and acceptance.   Dental advisory given  Plan Discussed with: CRNA  Anesthesia Plan Comments:         Anesthesia Quick Evaluation

## 2017-12-02 ENCOUNTER — Inpatient Hospital Stay (HOSPITAL_COMMUNITY): Payer: Medicare HMO | Admitting: Anesthesiology

## 2017-12-02 ENCOUNTER — Inpatient Hospital Stay (HOSPITAL_COMMUNITY): Payer: Medicare HMO

## 2017-12-02 ENCOUNTER — Inpatient Hospital Stay (HOSPITAL_COMMUNITY)
Admission: RE | Admit: 2017-12-02 | Discharge: 2017-12-03 | DRG: 483 | Disposition: A | Discharge status: Home / Self Care | Payer: Medicare HMO | Source: Ambulatory Visit | Attending: Orthopaedic Surgery | Admitting: Orthopaedic Surgery

## 2017-12-02 ENCOUNTER — Encounter (HOSPITAL_COMMUNITY)
Admission: RE | Disposition: A | Discharge status: Home / Self Care | Payer: Self-pay | Source: Ambulatory Visit | Attending: Orthopaedic Surgery

## 2017-12-02 DIAGNOSIS — Z87891 Personal history of nicotine dependence: Secondary | ICD-10-CM | POA: Diagnosis not present

## 2017-12-02 DIAGNOSIS — Z96653 Presence of artificial knee joint, bilateral: Secondary | ICD-10-CM | POA: Diagnosis present

## 2017-12-02 DIAGNOSIS — J302 Other seasonal allergic rhinitis: Secondary | ICD-10-CM | POA: Diagnosis present

## 2017-12-02 DIAGNOSIS — Z79899 Other long term (current) drug therapy: Secondary | ICD-10-CM | POA: Diagnosis not present

## 2017-12-02 DIAGNOSIS — Z7982 Long term (current) use of aspirin: Secondary | ICD-10-CM | POA: Diagnosis not present

## 2017-12-02 DIAGNOSIS — M65812 Other synovitis and tenosynovitis, left shoulder: Secondary | ICD-10-CM | POA: Diagnosis present

## 2017-12-02 DIAGNOSIS — Z96642 Presence of left artificial hip joint: Secondary | ICD-10-CM | POA: Diagnosis present

## 2017-12-02 DIAGNOSIS — E785 Hyperlipidemia, unspecified: Secondary | ICD-10-CM | POA: Diagnosis present

## 2017-12-02 DIAGNOSIS — H269 Unspecified cataract: Secondary | ICD-10-CM | POA: Diagnosis present

## 2017-12-02 DIAGNOSIS — Z8249 Family history of ischemic heart disease and other diseases of the circulatory system: Secondary | ICD-10-CM | POA: Diagnosis not present

## 2017-12-02 DIAGNOSIS — Z86010 Personal history of colonic polyps: Secondary | ICD-10-CM

## 2017-12-02 DIAGNOSIS — M1712 Unilateral primary osteoarthritis, left knee: Secondary | ICD-10-CM | POA: Diagnosis present

## 2017-12-02 DIAGNOSIS — I10 Essential (primary) hypertension: Secondary | ICD-10-CM | POA: Diagnosis present

## 2017-12-02 DIAGNOSIS — Z09 Encounter for follow-up examination after completed treatment for conditions other than malignant neoplasm: Secondary | ICD-10-CM

## 2017-12-02 DIAGNOSIS — Z791 Long term (current) use of non-steroidal anti-inflammatories (NSAID): Secondary | ICD-10-CM | POA: Diagnosis not present

## 2017-12-02 DIAGNOSIS — M25712 Osteophyte, left shoulder: Secondary | ICD-10-CM | POA: Diagnosis present

## 2017-12-02 DIAGNOSIS — M19012 Primary osteoarthritis, left shoulder: Principal | ICD-10-CM | POA: Diagnosis present

## 2017-12-02 HISTORY — PX: REVERSE SHOULDER ARTHROPLASTY: SHX5054

## 2017-12-02 SURGERY — ARTHROPLASTY, SHOULDER, TOTAL, REVERSE
Anesthesia: General | Laterality: Left

## 2017-12-02 MED ORDER — DOCUSATE SODIUM 100 MG PO CAPS
100.0000 mg | ORAL_CAPSULE | Freq: Two times a day (BID) | ORAL | Status: DC
  Administered 2017-12-02 – 2017-12-03 (×2): 100 mg via ORAL
  Filled 2017-12-02 (×2): qty 1

## 2017-12-02 MED ORDER — ONDANSETRON HCL 4 MG/2ML IJ SOLN: 4.0000 mg | Freq: Once | INTRAMUSCULAR | Status: DC | PRN

## 2017-12-02 MED ORDER — ZOLPIDEM TARTRATE 5 MG PO TABS: 5.0000 mg | ORAL_TABLET | Freq: Every evening | ORAL | Status: DC | PRN

## 2017-12-02 MED ORDER — BUPIVACAINE-EPINEPHRINE (PF) 0.5% -1:200000 IJ SOLN
INTRAMUSCULAR | Status: DC | PRN
  Administered 2017-12-02: 30 mL via PERINEURAL

## 2017-12-02 MED ORDER — FENTANYL CITRATE (PF) 100 MCG/2ML IJ SOLN
INTRAMUSCULAR | Status: DC | PRN
  Administered 2017-12-02 (×2): 75 ug via INTRAVENOUS

## 2017-12-02 MED ORDER — ONDANSETRON HCL 4 MG PO TABS
4.0000 mg | ORAL_TABLET | Freq: Four times a day (QID) | ORAL | Status: DC | PRN
  Administered 2017-12-03: 4 mg via ORAL
  Filled 2017-12-02: qty 1

## 2017-12-02 MED ORDER — ROCURONIUM BROMIDE 100 MG/10ML IV SOLN
INTRAVENOUS | Status: DC | PRN
  Administered 2017-12-02: 50 mg via INTRAVENOUS
  Administered 2017-12-02: 20 mg via INTRAVENOUS

## 2017-12-02 MED ORDER — OXYCODONE HCL 5 MG PO TABS
5.0000 mg | ORAL_TABLET | ORAL | Status: DC | PRN
  Administered 2017-12-02: 5 mg via ORAL
  Filled 2017-12-02: qty 1

## 2017-12-02 MED ORDER — CEFAZOLIN SODIUM-DEXTROSE 2-4 GM/100ML-% IV SOLN
INTRAVENOUS | Status: AC
  Filled 2017-12-02: qty 100

## 2017-12-02 MED ORDER — METOCLOPRAMIDE HCL 5 MG/ML IJ SOLN: 5.0000 mg | Freq: Three times a day (TID) | INTRAMUSCULAR | Status: DC | PRN

## 2017-12-02 MED ORDER — SUGAMMADEX SODIUM 200 MG/2ML IV SOLN
INTRAVENOUS | Status: AC
  Filled 2017-12-02: qty 2

## 2017-12-02 MED ORDER — PHENYLEPHRINE HCL 10 MG/ML IJ SOLN
INTRAMUSCULAR | Status: DC | PRN
  Administered 2017-12-02: 100 ug via INTRAVENOUS

## 2017-12-02 MED ORDER — SUGAMMADEX SODIUM 200 MG/2ML IV SOLN
INTRAVENOUS | Status: DC | PRN
  Administered 2017-12-02: 170.8 mg via INTRAVENOUS

## 2017-12-02 MED ORDER — 0.9 % SODIUM CHLORIDE (POUR BTL) OPTIME
TOPICAL | Status: DC | PRN
  Administered 2017-12-02: 1000 mL

## 2017-12-02 MED ORDER — MIDAZOLAM HCL 5 MG/5ML IJ SOLN
INTRAMUSCULAR | Status: DC | PRN
  Administered 2017-12-02: 1 mg via INTRAVENOUS

## 2017-12-02 MED ORDER — OXYCODONE HCL 5 MG PO TABS
10.0000 mg | ORAL_TABLET | ORAL | Status: DC | PRN
  Administered 2017-12-02 – 2017-12-03 (×3): 10 mg via ORAL
  Filled 2017-12-02 (×3): qty 2

## 2017-12-02 MED ORDER — MORPHINE SULFATE (PF) 2 MG/ML IV SOLN: 2.0000 mg | INTRAVENOUS | Status: DC | PRN

## 2017-12-02 MED ORDER — ONDANSETRON HCL 4 MG/2ML IJ SOLN: 4.0000 mg | Freq: Four times a day (QID) | INTRAMUSCULAR | Status: DC | PRN

## 2017-12-02 MED ORDER — ASPIRIN EC 81 MG PO TBEC
81.0000 mg | DELAYED_RELEASE_TABLET | Freq: Every day | ORAL | Status: DC
  Administered 2017-12-03: 81 mg via ORAL
  Filled 2017-12-02: qty 1

## 2017-12-02 MED ORDER — HYDROCHLOROTHIAZIDE 25 MG PO TABS
25.0000 mg | ORAL_TABLET | Freq: Every day | ORAL | Status: DC
  Administered 2017-12-02 – 2017-12-03 (×2): 25 mg via ORAL
  Filled 2017-12-02 (×2): qty 1

## 2017-12-02 MED ORDER — ALBUMIN HUMAN 5 % IV SOLN
INTRAVENOUS | Status: DC | PRN
  Administered 2017-12-02: 10:00:00 via INTRAVENOUS

## 2017-12-02 MED ORDER — LISINOPRIL-HYDROCHLOROTHIAZIDE 20-25 MG PO TABS: 1.0000 | ORAL_TABLET | Freq: Every day | ORAL | Status: DC

## 2017-12-02 MED ORDER — DEXAMETHASONE SODIUM PHOSPHATE 10 MG/ML IJ SOLN
INTRAMUSCULAR | Status: AC
  Filled 2017-12-02: qty 1

## 2017-12-02 MED ORDER — ROCURONIUM BROMIDE 10 MG/ML (PF) SYRINGE
PREFILLED_SYRINGE | INTRAVENOUS | Status: AC
  Filled 2017-12-02: qty 5

## 2017-12-02 MED ORDER — ATORVASTATIN CALCIUM 10 MG PO TABS
10.0000 mg | ORAL_TABLET | Freq: Every day | ORAL | Status: DC
  Administered 2017-12-02: 10 mg via ORAL
  Filled 2017-12-02: qty 1

## 2017-12-02 MED ORDER — FENTANYL CITRATE (PF) 100 MCG/2ML IJ SOLN: 25.0000 ug | INTRAMUSCULAR | Status: DC | PRN

## 2017-12-02 MED ORDER — LACTATED RINGERS IV SOLN
INTRAVENOUS | Status: DC | PRN
  Administered 2017-12-02 (×2): via INTRAVENOUS

## 2017-12-02 MED ORDER — OXYCODONE HCL 5 MG/5ML PO SOLN: 5.0000 mg | Freq: Once | ORAL | Status: DC | PRN

## 2017-12-02 MED ORDER — PHENYLEPHRINE HCL 10 MG/ML IJ SOLN
INTRAMUSCULAR | Status: DC | PRN
  Administered 2017-12-02: 50 ug/min via INTRAVENOUS

## 2017-12-02 MED ORDER — LIDOCAINE HCL (CARDIAC) 20 MG/ML IV SOLN
INTRAVENOUS | Status: DC | PRN
  Administered 2017-12-02: 30 mg via INTRAVENOUS

## 2017-12-02 MED ORDER — SODIUM CHLORIDE 0.9 % IR SOLN
Status: DC | PRN
  Administered 2017-12-02: 3000 mL

## 2017-12-02 MED ORDER — MELOXICAM 7.5 MG PO TABS
15.0000 mg | ORAL_TABLET | Freq: Every day | ORAL | Status: DC
  Administered 2017-12-03: 15 mg via ORAL
  Filled 2017-12-02: qty 2

## 2017-12-02 MED ORDER — PROPOFOL 10 MG/ML IV BOLUS
INTRAVENOUS | Status: AC
  Filled 2017-12-02: qty 40

## 2017-12-02 MED ORDER — OXYCODONE HCL 5 MG PO TABS: 5.0000 mg | ORAL_TABLET | Freq: Once | ORAL | Status: DC | PRN

## 2017-12-02 MED ORDER — ONDANSETRON HCL 4 MG/2ML IJ SOLN
INTRAMUSCULAR | Status: DC | PRN
  Administered 2017-12-02: 4 mg via INTRAVENOUS

## 2017-12-02 MED ORDER — CEFAZOLIN SODIUM-DEXTROSE 2-4 GM/100ML-% IV SOLN
2.0000 g | INTRAVENOUS | Status: AC
  Administered 2017-12-02: 2 g via INTRAVENOUS

## 2017-12-02 MED ORDER — CEFAZOLIN SODIUM-DEXTROSE 1-4 GM/50ML-% IV SOLN
1.0000 g | Freq: Four times a day (QID) | INTRAVENOUS | Status: AC
  Administered 2017-12-02 – 2017-12-03 (×3): 1 g via INTRAVENOUS
  Filled 2017-12-02 (×3): qty 50

## 2017-12-02 MED ORDER — FENTANYL CITRATE (PF) 250 MCG/5ML IJ SOLN
INTRAMUSCULAR | Status: AC
  Filled 2017-12-02: qty 5

## 2017-12-02 MED ORDER — PHENYLEPHRINE 40 MCG/ML (10ML) SYRINGE FOR IV PUSH (FOR BLOOD PRESSURE SUPPORT)
PREFILLED_SYRINGE | INTRAVENOUS | Status: AC
  Filled 2017-12-02: qty 10

## 2017-12-02 MED ORDER — ONDANSETRON HCL 4 MG/2ML IJ SOLN
INTRAMUSCULAR | Status: AC
  Filled 2017-12-02: qty 2

## 2017-12-02 MED ORDER — LISINOPRIL 20 MG PO TABS
20.0000 mg | ORAL_TABLET | Freq: Every day | ORAL | Status: DC
  Administered 2017-12-02 – 2017-12-03 (×2): 20 mg via ORAL
  Filled 2017-12-02 (×2): qty 1

## 2017-12-02 MED ORDER — ACETAMINOPHEN 500 MG PO TABS
1000.0000 mg | ORAL_TABLET | Freq: Four times a day (QID) | ORAL | Status: DC
  Administered 2017-12-02 – 2017-12-03 (×3): 1000 mg via ORAL
  Filled 2017-12-02 (×3): qty 2

## 2017-12-02 MED ORDER — MIDAZOLAM HCL 2 MG/2ML IJ SOLN
INTRAMUSCULAR | Status: AC
  Filled 2017-12-02: qty 2

## 2017-12-02 MED ORDER — METOCLOPRAMIDE HCL 5 MG PO TABS: 5.0000 mg | ORAL_TABLET | Freq: Three times a day (TID) | ORAL | Status: DC | PRN

## 2017-12-02 MED ORDER — DEXAMETHASONE SODIUM PHOSPHATE 10 MG/ML IJ SOLN
INTRAMUSCULAR | Status: DC | PRN
  Administered 2017-12-02: 10 mg via INTRAVENOUS

## 2017-12-02 MED ORDER — CHLORHEXIDINE GLUCONATE 4 % EX LIQD: 60.0000 mL | Freq: Once | CUTANEOUS | Status: DC

## 2017-12-02 MED ORDER — LIDOCAINE 2% (20 MG/ML) 5 ML SYRINGE
INTRAMUSCULAR | Status: AC
  Filled 2017-12-02: qty 5

## 2017-12-02 MED ORDER — DIPHENHYDRAMINE HCL 12.5 MG/5ML PO ELIX: 12.5000 mg | ORAL_SOLUTION | ORAL | Status: DC | PRN

## 2017-12-02 MED ORDER — PROPOFOL 10 MG/ML IV BOLUS
INTRAVENOUS | Status: DC | PRN
  Administered 2017-12-02: 150 mg via INTRAVENOUS

## 2017-12-02 SURGICAL SUPPLY — 54 items
BENZOIN TINCTURE PRP APPL 2/3 (GAUZE/BANDAGES/DRESSINGS) ×3 IMPLANT
BIT DRILL 3.2 PERIPHERAL SCREW (BIT) ×3 IMPLANT
BLADE SAW SAG 73X25 THK (BLADE) ×2
BLADE SAW SGTL 73X25 THK (BLADE) ×1 IMPLANT
CAP SHOULDER REVTOTAL 2 ×3 IMPLANT
CHLORAPREP W/TINT 26ML (MISCELLANEOUS) ×3 IMPLANT
CLOSURE STERI-STRIP 1/2X4 (GAUZE/BANDAGES/DRESSINGS) ×1
CLOSURE WOUND 1/2 X4 (GAUZE/BANDAGES/DRESSINGS) ×1
CLSR STERI-STRIP ANTIMIC 1/2X4 (GAUZE/BANDAGES/DRESSINGS) ×2 IMPLANT
COVER SURGICAL LIGHT HANDLE (MISCELLANEOUS) ×3 IMPLANT
DRAPE C-ARM 42X72 X-RAY (DRAPES) IMPLANT
DRAPE HALF SHEET 40X57 (DRAPES) ×6 IMPLANT
DRAPE INCISE IOBAN 66X45 STRL (DRAPES) IMPLANT
DRAPE U-SHAPE 47X51 STRL (DRAPES) IMPLANT
DRSG AQUACEL AG ADV 3.5X 6 (GAUZE/BANDAGES/DRESSINGS) ×3 IMPLANT
ELECT REM PT RETURN 9FT ADLT (ELECTROSURGICAL) ×3
ELECTRODE REM PT RTRN 9FT ADLT (ELECTROSURGICAL) ×1 IMPLANT
GLENOID BASEPLATE 29 +3 (Joint) ×3 IMPLANT
GLOVE BIOGEL PI IND STRL 8 (GLOVE) ×1 IMPLANT
GLOVE BIOGEL PI INDICATOR 8 (GLOVE) ×2
GLOVE ECLIPSE 8.0 STRL XLNG CF (GLOVE) ×6 IMPLANT
GOWN STRL REUS W/ TWL LRG LVL3 (GOWN DISPOSABLE) ×1 IMPLANT
GOWN STRL REUS W/ TWL XL LVL3 (GOWN DISPOSABLE) ×2 IMPLANT
GOWN STRL REUS W/TWL LRG LVL3 (GOWN DISPOSABLE) ×2
GOWN STRL REUS W/TWL XL LVL3 (GOWN DISPOSABLE) ×4
GUIDEWIRE GLENOID 2.5X220 (WIRE) ×3 IMPLANT
HANDPIECE INTERPULSE COAX TIP (DISPOSABLE) ×2
KIT BASIN OR (CUSTOM PROCEDURE TRAY) ×3 IMPLANT
KIT ROOM TURNOVER OR (KITS) ×3 IMPLANT
MANIFOLD NEPTUNE II (INSTRUMENTS) ×3 IMPLANT
NEEDLE HYPO 25GX1X1/2 BEV (NEEDLE) IMPLANT
NEEDLE MAYO TROCAR (NEEDLE) IMPLANT
NS IRRIG 1000ML POUR BTL (IV SOLUTION) ×3 IMPLANT
PACK SHOULDER (CUSTOM PROCEDURE TRAY) ×3 IMPLANT
PAD ARMBOARD 7.5X6 YLW CONV (MISCELLANEOUS) ×6 IMPLANT
RESTRAINT HEAD UNIVERSAL NS (MISCELLANEOUS) ×3 IMPLANT
SCREW BONE 6.5X40 SM (Screw) ×3 IMPLANT
SET HNDPC FAN SPRY TIP SCT (DISPOSABLE) ×1 IMPLANT
SLING ARM IMMOBILIZER LRG (SOFTGOODS) ×3 IMPLANT
SPONGE LAP 18X18 X RAY DECT (DISPOSABLE) ×3 IMPLANT
STRIP CLOSURE SKIN 1/2X4 (GAUZE/BANDAGES/DRESSINGS) ×2 IMPLANT
SUCTION FRAZIER HANDLE 10FR (MISCELLANEOUS)
SUCTION TUBE FRAZIER 10FR DISP (MISCELLANEOUS) IMPLANT
SUT ETHIBOND 2 V 37 (SUTURE) ×3 IMPLANT
SUT ETHIBOND NAB CT1 #1 30IN (SUTURE) ×3 IMPLANT
SUT FIBERWIRE #5 38 CONV NDL (SUTURE) ×12
SUT MNCRL AB 4-0 PS2 18 (SUTURE) ×3 IMPLANT
SUT VIC AB 2-0 CT1 27 (SUTURE) ×2
SUT VIC AB 2-0 CT1 TAPERPNT 27 (SUTURE) ×1 IMPLANT
SUTURE FIBERWR #5 38 CONV NDL (SUTURE) ×4 IMPLANT
TOWEL OR 17X24 6PK STRL BLUE (TOWEL DISPOSABLE) ×3 IMPLANT
TOWEL OR 17X26 10 PK STRL BLUE (TOWEL DISPOSABLE) ×3 IMPLANT
TRAY FOLEY CATH SILVER 14FR (SET/KITS/TRAYS/PACK) IMPLANT
WATER STERILE IRR 1000ML POUR (IV SOLUTION) IMPLANT

## 2017-12-02 NOTE — Op Note (Signed)
Orthopaedic Surgery Operative Note (CSN: [id number])  Andre Jackson  1945/10/18 Date of Surgery: 12/02/2017   Diagnoses:  DJD left shoulder end stage  Procedures:   * REVERSE SHOULDER ARTHROPLASTY [id number]   Operative Finding Successful completion of planned procedure.  Great stability, end stage bone erosion of the glenoid but vault intact.  Good fixation.   Post-operative plan: The patient will be NWB in sling with ER no more than 20 initially.  The patient will be admitted overnight.  DVT prophylaxis not indicated in isolated upper extremity surgery patient with no specific risks factors.  Pain control with PRN pain medication preferring oral medicines.  Follow up plan will be scheduled in approximately 10-14 days for wound check and XR.  Post-Op Diagnosis: Same Surgeons:Primary: Andre Gash, MD Assistants:Andre Jackson OPA Location: [facility] OR ROOM 03 Anesthesia: Choice Antibiotics: Ancef 2g preop Tourniquet time: * No tourniquets in log * Estimated Blood Loss: 314 Complications: None Specimens: None Implants: Implant Name Type Inv. Item Serial No. Manufacturer Lot No. LRB No. Used Action  lateralized baseplate-[id number]   [id number] [employer]  Left 1 Implanted  standard glenosphere- cocr   [id number] [employer]  Left 1 Implanted  SCREW BONE 6.5X40MM SMALL - [id number] Screw SCREW BONE 6.5X40MM SMALL  [employer]  Left 1 Implanted  screw 6.69m x454m   [employer]  Left 1 Implanted  screw bone [id number]    [employer]  Left 1 Implanted  reversed insert-[id number]   [id number]  Left 1 Implanted  IMPLANT REVERSE SHOULDER 0X3.5 - [id number] IMPLANT REVERSE SHOULDER 0X3.5 [id number] [place] 1 Implanted  standard ptc humeral stem [id number] [employer]  Left 1 Implanted    Indications for Surgery:   FraIZAN Jackson a 71 45o. 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   We were  worried about cuff function especially in the setting of his medial erosion and talked about reverse shoulder arthroplasty. Benefits and risks of operative and nonoperative management were discussed prior to surgery with patient/guardian(s) and informed consent form was completed.  Specific risks including infection, need for additional surgery, dislocation, fracture, axillary nerve injury   Procedure:   The patient was identified in the preoperative holding area where the surgical site was marked. The patient was taken to the OR where a procedural timeout was called and the above noted anesthesia was induced.  The patient was positioned beachchair on allen with spider.  Preoperative antibiotics were dosed.  The patient's left shoulder was prepped and draped in the usual sterile fashion.  A second preoperative timeout was called.      Standard deltopectoral approach was performed with a #10 blade. We dissected down to the subcutaneous tissues and the cephalic vein was taken laterally with the deltoid. Clavipectoral fascia was incised in line with the incision. Deep retractors were placed. The long of the biceps tendon was identified and there was significant tenosynovitis present.  Tenodesis was performed to the pectoralis tendon with #2 Ethibond. The remaining biceps was followed up into the rotator interval where it was released.   The subscapularis was taken down in a full thickness layer with capsule along the humeral neck extending inferiorly around the humeral head. We continued releasing the capsule directly off of the osteophytes inferiorly all the way around the corner. This allowed us [place] dislocate the humeral head.   The humeral  head had evidence of severe osteoarthritic wear with full-thickness cartilage loss and exposed subchondral bone. There was significant flattening of the humeral head.   The rotator cuff was carefully examined and noted to be partially thinned.  The decision was  confirmed that a reverse total shoulder was indicated for this patient.  There were osteophytes along the inferior humeral neck. The osteophytes were removed with an osteotome and a rongeur.  Osteophytes were removed with a rongeur and an osteotome and the anatomic neck was well visualized.     A humeral cutting guide was inserted down the intramedullary canal. The version was set at 20 of retroversion. Humeral osteotomy was performed with an oscillating saw. The head fragment was passed off the back table. A starter awl was used to open the humeral canal. We next used T-handle straight sound reamers to ream up to an appropriate fit. A chisel was used to remove proximal humeral bone. We then broached starting with a size one broach and broaching up to 5 which obtained an appropriate fit. The broach handle was removed. A cut protector was placed. The broach handle was removed and a cut protector was placed. The humerus was retracted posteriorly and we turned our attention to glenoid exposure.  The subscapularis was again identified and immediately we took care to palpate the axillary nerve anteriorly and verify its position with gentle palpation as well as the tug test.  We then released the SGHL with bovie cautery prior to placing a curved mayo at the junction of the anterior glenoid well above the axillary nerve and bluntly dissecting the subscapularis from the capsule.  We then carefully protected the axillary nerve as we gently released the inferior capsule to fully mobilize the subscapularis.  An anterior deltoid retractor was then placed as well as a small Hohmann retractor superiorly.   The glenoid was inspected and had evidence of severe osteoarthritic wear with full-thickness cartilage loss and exposed subchondral bone. The remaining labrum was removed circumferentially taking great care not to disrupt the posterior capsule.   The glenoid drill guide was placed and used to drill a guide pin in the  center, inferior position. The glenoid face was then reamed concentrically over the guide wire. The center hole was drilled over the guidepin in a near anatomic angle of version. Next the glenoid vault was drilled back to a depth of 40 mm and was intact.  We tapped and then placed a 54m size baseplate with 320mlateralization was selected with a 6.5 mm x 40 mm length central screw.  The base plate was screwed into the glenoid vault obtaining secure fixation. We next placed superior and inferior locking screws for additional fixation.  Next a 42 mm glenosphere was selected and impacted onto the baseplate. The center screw was tightened.  We turned attention back to the humeral side. The cut protector was removed. We trialed with multiple size tray and polyethylene options and selected a 6 which provided good stability and range of motion without excess soft tissue tension. The offset was dialed in to match the normal anatomy. The shoulder was trialed.  There was good ROM in all planes and the shoulder was stable with no inferior translation.  The real humeral implants were opened after again confirming sizes.  The trial was removed. #5 Fiberwire sutures passed through the humeral neck for subscap repair. The humeral component was press-fit obtaining a secure fit. A +0 high offset tray was selected and impacted onto the stem.  A 42+6 polyethylene liner was impacted onto the stem.  The joint was reduced and thoroughly irrigated with pulsatile lavage. Subscap was repaired back with #5 Fiberwire sutures through bone tunnels. Hemostasis was obtained. The deltopectoral interval was reapproximated with #1 Ethibond. The subcutaneous tissues were closed with 2-0 Vicryl and the skin was closed with running monocryl.    The wounds were cleaned and dried and an Aquacel dressing was placed. The drapes taken down. The arm was placed into sling with abduction pillow. Patient was awakened, extubated, and transferred to the  recovery room in stable condition. There were no intraoperative complications. The sponge, needle, and attention counts were  correct at the end of the case.    Joya Gaskins, OPA-C, present and scrubbed throughout the case, critical for completion in a timely fashion, and for retraction, instrumentation, closure.

## 2017-12-02 NOTE — Anesthesia Procedure Notes (Signed)
Anesthesia Regional Block: Interscalene brachial plexus block   Pre-Anesthetic Checklist: ,, timeout performed, Correct Patient, Correct Site, Correct Laterality, Correct Procedure, Correct Position, site marked, Risks and benefits discussed,  Surgical consent,  Pre-op evaluation,  At surgeon's request and post-op pain management  Laterality: Left  Prep: chloraprep       Needles:  Injection technique: Single-shot  Needle Type: Echogenic Needle     Needle Length: 9cm  Needle Gauge: 21     Additional Needles:   Narrative:  Start time: 12/02/2017 8:09 AM End time: 12/02/2017 8:13 AM Injection made incrementally with aspirations every 5 mL.  Performed by: Personally  Anesthesiologist: Audry Pili, MD  Additional Notes: No pain on injection. No increased resistance to injection. Injection made in 5cc increments. Good needle visualization. Patient tolerated the procedure well.

## 2017-12-02 NOTE — Anesthesia Procedure Notes (Signed)
Procedure Name: Intubation Date/Time: 12/02/2017 8:39 AM Performed by: Eligha Bridegroom, CRNA Pre-anesthesia Checklist: Patient identified, Emergency Drugs available, Suction available, Patient being monitored and Timeout performed Patient Re-evaluated:Patient Re-evaluated prior to induction Oxygen Delivery Method: Circle system utilized Preoxygenation: Pre-oxygenation with 100% oxygen Induction Type: IV induction Ventilation: Mask ventilation without difficulty Laryngoscope Size: Mac and 4 Grade View: Grade III Tube type: Oral Tube size: 7.5 mm Number of attempts: 1 Airway Equipment and Method: Stylet Placement Confirmation: ETT inserted through vocal cords under direct vision,  positive ETCO2 and breath sounds checked- equal and bilateral Secured at: 21 cm Tube secured with: Tape Dental Injury: Teeth and Oropharynx as per pre-operative assessment

## 2017-12-02 NOTE — Transfer of Care (Signed)
Immediate Anesthesia Transfer of Care Note  Patient: Andre Jackson  Procedure(s) Performed: REVERSE SHOULDER ARTHROPLASTY (Left )  Patient Location: PACU  Anesthesia Type:General and GA combined with regional for post-op pain  Level of Consciousness: awake, alert  and oriented  Airway & Oxygen Therapy: Patient Spontanous Breathing and Patient connected to nasal cannula oxygen  Post-op Assessment: Report given to RN and Post -op Vital signs reviewed and stable  Post vital signs: Reviewed and stable  Last Vitals:  Vitals:   12/02/17 0623 12/02/17 1051  BP: 126/79 (P) 125/87  Pulse: 93 (P) 83  Resp: 20 (P) 13  Temp: 36.8 C (P) 36.7 C  SpO2: 100% (P) 99%    Last Pain:  Vitals:   12/02/17 1051  TempSrc:   PainSc: (P) 0-No pain         Complications: No apparent anesthesia complications

## 2017-12-02 NOTE — Anesthesia Postprocedure Evaluation (Signed)
Anesthesia Post Note  Patient: [name]  Procedure(s) Performed: REVERSE SHOULDER ARTHROPLASTY (Left )     Patient location during evaluation: PACU Anesthesia Type: General Level of consciousness: awake and alert Pain management: pain level controlled Vital Signs Assessment: post-procedure vital signs reviewed and stable Respiratory status: spontaneous breathing, nonlabored ventilation, respiratory function stable and patient connected to nasal cannula oxygen Cardiovascular status: blood pressure returned to baseline and stable Postop Assessment: no apparent nausea or vomiting Anesthetic complications: no    Last Vitals:  Vitals:   12/02/17 1130 12/02/17 1145  BP: 118/79 120/76  Pulse: 84 83  Resp: 14 14  Temp:    SpO2: 98% 99%    Last Pain:  Vitals:   12/02/17 1145  TempSrc:   PainSc: 0-No pain                 Audry Pili

## 2017-12-02 NOTE — Interval H&P Note (Signed)
Discussed case, risks and benefits with patient again.  All questions answered, no change to history.  Usha Slager MD  

## 2017-12-03 MED ORDER — MELOXICAM 7.5 MG PO TABS: 7.5000 mg | ORAL_TABLET | Freq: Every day | ORAL | 2 refills | Status: AC

## 2017-12-03 MED ORDER — OXYCODONE HCL 5 MG PO TABS: ORAL_TABLET | ORAL | 0 refills | Status: AC

## 2017-12-03 MED ORDER — ACETAMINOPHEN 500 MG PO TABS: 1000.0000 mg | ORAL_TABLET | Freq: Three times a day (TID) | ORAL | 0 refills | Status: AC

## 2017-12-03 MED ORDER — ONDANSETRON HCL 4 MG PO TABS: 4.0000 mg | ORAL_TABLET | Freq: Three times a day (TID) | ORAL | 1 refills | Status: AC | PRN

## 2017-12-03 MED ORDER — OMEPRAZOLE 20 MG PO CPDR: 20.0000 mg | DELAYED_RELEASE_CAPSULE | Freq: Every day | ORAL | 0 refills | Status: DC

## 2017-12-03 NOTE — Evaluation (Signed)
Occupational Therapy Evaluation Patient Details Name: Andre Jackson MRN: [id number] DOB: Aug 15, 1946 Today's Date: 12/03/2017    History of Present Illness 72 yo male s/p REVERSE SHOULDER ARTHROPLASTY (Left ).    Clinical Impression   Pt admitted with the above diagnoses and presents with below problem list. PTA pt was independent with ADLs. Pt is currently mod A with UB ADLs, min A for LB ADLs, supervision to min guard for bed mobility and functional transfers/mobility. All shoulder education completed with pt and spouse involved in session. Pt tolerated session well and is ok for d/c from OT standpoint.     Follow Up Recommendations  Follow surgeon's recommendation for DC plan and follow-up therapies;Supervision - Intermittent    Equipment Recommendations  None recommended by OT    Recommendations for Other Services       Precautions / Restrictions Precautions Precautions: Shoulder Shoulder Interventions: Shoulder sling/immobilizer;At all times;Off for dressing/bathing/exercises Precaution Booklet Issued: Yes (comment) Required Braces or Orthoses: Sling Restrictions Weight Bearing Restrictions: Yes LUE Weight Bearing: Non weight bearing      Mobility Bed Mobility Overal bed mobility: Needs Assistance Bed Mobility: Supine to Sit     Supine to sit: Supervision;HOB elevated        Transfers Overall transfer level: Needs assistance Equipment used: None Transfers: Sit to/from Stand Sit to Stand: Supervision;Min guard              Balance Overall balance assessment: No apparent balance deficits (not formally assessed)                                         ADL either performed or assessed with clinical judgement   ADL Overall ADL's : Needs assistance/impaired Eating/Feeding: Set up;Sitting   Grooming: Minimal assistance;Sitting   Upper Body Bathing: Sitting;Moderate assistance   Lower Body Bathing: Minimal assistance;Sit to/from  stand   Upper Body Dressing : Moderate assistance;Sitting   Lower Body Dressing: Minimal assistance;Sit to/from stand   Toilet Transfer: Min guard;Ambulation   Toileting- Clothing Manipulation and Hygiene: Min guard;Minimal assistance;Sit to/from stand   Tub/ Shower Transfer: Tub transfer;Min guard;Ambulation   Functional mobility during ADLs: Min guard General ADL Comments: Pt completed bed mobility, UB/LB ADLs, inroom functional mobility. Spouse present and involved in session.     Vision         Perception     Praxis      Pertinent Vitals/Pain Pain Assessment: 0-10 Pain Score: 2  Pain Location: L shoulder Pain Descriptors / Indicators: Aching;Sore Pain Intervention(s): Monitored during session;Premedicated before session;Repositioned     Hand Dominance Right   Extremity/Trunk Assessment Upper Extremity Assessment Upper Extremity Assessment: LUE deficits/detail LUE Deficits / Details: s/p REVERSE SHOULDER ARTHROPLASTY (Left ). full ROM e/w/h.  LUE: Unable to fully assess due to immobilization   Lower Extremity Assessment Lower Extremity Assessment: Overall WFL for tasks assessed       Communication Communication Communication: No difficulties   Cognition Arousal/Alertness: Awake/alert Behavior During Therapy: WFL for tasks assessed/performed Overall Cognitive Status: Within Functional Limits for tasks assessed                                     General Comments       Exercises     Shoulder Instructions      Home Living Family/patient  expects to be discharged to:: Private residence Living Arrangements: Spouse/significant other Available Help at Discharge: Family;Available 24 hours/day Type of Home: House Home Access: Level entry     Home Layout: One level     Bathroom Shower/Tub: Tub/shower unit         Home Equipment: Grab bars - tub/shower;Other (comment)(adjustable bed)   Additional Comments: spouse plans to get shower  seat at d/c      Prior Functioning/Environment Level of Independence: Independent                 OT Problem List: Impaired balance (sitting and/or standing);Decreased knowledge of use of DME or AE;Decreased knowledge of precautions;Pain;Impaired UE functional use      OT Treatment/Interventions:      OT Goals(Current goals can be found in the care plan section) Acute Rehab OT Goals Patient Stated Goal: home today  OT Frequency:     Barriers to D/C:            Co-evaluation              AM-PAC PT "6 Clicks" Daily Activity     Outcome Measure Help from another person eating meals?: None Help from another person taking care of personal grooming?: A Little Help from another person toileting, which includes using toliet, bedpan, or urinal?: A Little Help from another person bathing (including washing, rinsing, drying)?: A Lot Help from another person to put on and taking off regular upper body clothing?: A Lot Help from another person to put on and taking off regular lower body clothing?: A Little 6 Click Score: 17   End of Session Equipment Utilized During Treatment: Other (comment)(sling)  Activity Tolerance: Patient tolerated treatment well Patient left: in bed;with call bell/phone within reach;with nursing/sitter in room;with family/visitor present;Other (comment)(sitting EOB)  OT Visit Diagnosis: Unsteadiness on feet (R26.81);Pain Pain - Right/Left: Left Pain - part of body: Shoulder                Time: 4492-0100 OT Time Calculation (min): 31 min Charges:  OT General Charges $OT Visit: 1 Visit OT Evaluation $OT Eval Low Complexity: 1 Low OT Treatments $Self Care/Home Management : 8-22 mins G-Codes:       Hortencia Pilar 12/03/2017, 10:24 AM

## 2017-12-03 NOTE — Progress Notes (Signed)
Patient was discharged with written and verbal instructions.  Left arm remains supported in a sling.  Discharged via wheel chair with wife to their home.

## 2017-12-04 ENCOUNTER — Encounter (HOSPITAL_COMMUNITY): Payer: Self-pay | Admitting: Orthopaedic Surgery

## 2017-12-08 NOTE — Discharge Summary (Signed)
Patient ID: Andre Jackson MRN: [id number] DOB/AGE: November 01, 1945 72 y.o.  Admit date: 12/02/2017 Discharge date: 12/08/2017  Admission Diagnoses:L shoulder arthritis  Discharge Diagnoses:  Active Problems:   Degenerative arthritis of left shoulder region   Past Medical History:  Diagnosis Date  . Arthritis    "knees, hips" (04/03/2015)  . Cataract immature   . Diverticulosis   . Hx of colonic polyps   . Hyperlipidemia    takes Lipitor daily  . Hypertension    takes Prinizide daily  . Joint pain   . Joint swelling   . Left knee DJD   . Seasonal allergies    takes Allegra daily     Procedures Performed: L reverse total shoulder arthroplasty  Discharged Condition: good  Hospital Course: Patient brought in as an outpatient for surgery.  Tolerated procedure well.  Was kept for monitoring overnight for pain control and medical monitoring postop and was found to be stable for DC home the morning after surgery.  Patient was instructed on specific activity restrictions and all questions were answered.   Consults: None  Significant Diagnostic Studies: No additional pertinent studies  Treatments: Surgery  Discharge Exam:  Dressing CDI and sling well fitting,  full and painless ROM throughout hand with DPC of 0. + Motor in  AIN, PIN, Ulnar distributions. Axillary nerve sensation preserved and symmetric.  Sensation intact in medial, radial, and ulnar distributions. Well perfused digits.   Disposition: 01-Home or Self Care  Discharge Instructions    Call MD for:  persistant nausea and vomiting   Complete by:  As directed    Call MD for:  redness, tenderness, or signs of infection (pain, swelling, redness, odor or green/yellow discharge around incision site)   Complete by:  As directed    Call MD for:  severe uncontrolled pain   Complete by:  As directed    Diet - low sodium heart healthy   Complete by:  As directed    Discharge instructions   Complete by:  As directed    Ophelia Charter MD, MPH [place]. [address], [address] [phone] (tel)   [phone] (fax)   [place] may leave the operative dressing in place until your follow-up appointment. KEEP THE INCISIONS CLEAN AND DRY. Use the Cryocuff, GameReady or Ice as often as possible for the first 3-4 days, then as needed for pain relief.  You may shower on Post-Op Day #2. The dressing is water resistant but do not scrub it as it may start to peel up.  You may remove the sling for showering, but keep a water resistant pillow under the arm to keep both the elbow and shoulder away from the body (mimicking the abduction sling). Gently pat the area dry. Do not soak the shoulder in water. Do not go swimming in the pool or ocean until your sutures are removed.  EXERCISES Wear the sling at all times except when doing your exercises. You may remove the sling for showering, but keep the arm across the chest or in a secondary sling.   Accidental/Purposeful External Rotation and shoulder flexion (reaching behind you) is to be avoided at all costs for the first month. Please perform the exercises:   Elbow / Hand / Wrist  Range of Motion Exercises POST-OP A multi-modal approach will be used to treat your pain. Oxycodone - This is a strong narcotic, to be used only on an "  as needed" basis for pain. Meloxicam- An anti-inflammatory medication Acetaminophen - A non-narcotic pain medicine.  Use 1000mg  three times a day for the first 14 days after surgery If you have any adverse effects with the medications, please call our office.  FOLLOW-UP If you develop a Fever (<101.5), Redness or Drainage from the surgical incision site, please call our office to arrange for an evaluation. Please call the office to schedule a follow-up appointment for a wound check, 7-10 days post-operatively.    IF YOU HAVE ANY QUESTIONS, PLEASE FEEL  FREE TO CALL OUR OFFICE.   HELPFUL INFORMATION  Your arm will be in a sling following surgery. You will be in this sling for the next 3-4 weeks.  I will let you know the exact duration at your follow-up visit.  You may be more comfortable sleeping in a semi-seated position the first few nights following surgery.  Keep a pillow propped under the elbow and forearm for comfort.  If you have a recliner type of chair it might be beneficial.  If not that is fine too, but it would be helpful to sleep propped up with pillows behind your operated shoulder as well under your elbow and forearm.  This will reduce pulling on the suture lines.  We suggest you use the pain medication the first night prior to going to bed, in order to ease any pain when the anesthesia wears off. You should avoid taking pain medications on an empty stomach as it will make you nauseous.  Do not drink alcoholic beverages or take illicit drugs when taking pain medications.  In most states it is against the law to drive while your arm is in a sling. And certainly against the law to drive while taking narcotics.  You may return to work/school in the next couple of days when you feel up to it. Desk work and typing in the sling is fine.  When dressing, put your operative arm in the sleeve first.  When getting undressed, take your operative arm out last.  Loose fitting, button-down shirts are recommended.  Pain medication may make you constipated.  Below are a few solutions to try in this order: Decrease the amount of pain medication if you aren't having pain. Drink lots of decaffeinated fluids. Drink prune juice and/or each dried prunes  If the first 3 don't work start with additional solutions Take Colace - an over-the-counter stool softener Take Senokot - an over-the-counter laxative Take Miralax - a stronger over-the-counter laxative   Increase activity slowly   Complete by:  As directed      Allergies as of 12/03/2017     No Known Allergies     Medication List    STOP taking these medications   acetaminophen 650 MG CR tablet Commonly known as:  TYLENOL Replaced by:  acetaminophen 500 MG tablet     TAKE these medications   acetaminophen 500 MG tablet Commonly known as:  TYLENOL Take 2 tablets (1,000 mg total) by mouth every 8 (eight) hours for 14 days. Replaces:  acetaminophen 650 MG CR tablet   aspirin EC 81 MG tablet Take 81 mg by mouth daily.   atorvastatin 10 MG tablet Commonly known as:  LIPITOR Take 10 mg by mouth daily.   lisinopril-hydrochlorothiazide 20-25 MG tablet Commonly known as:  PRINZIDE,ZESTORETIC Take 1 tablet by mouth daily.   meloxicam 7.5 MG tablet Commonly known as:  MOBIC Take 1 tablet (7.5 mg total) by mouth daily. What changed:  medication strength  how much to take   naproxen sodium 220 MG tablet Commonly known as:  ALEVE Take 220 mg by mouth daily as needed (for pain or headache).   omeprazole 20 MG capsule Commonly known as:  PRILOSEC Take 1 capsule (20 mg total) by mouth daily for 14 days.   ondansetron 4 MG tablet Commonly known as:  ZOFRAN Take 1 tablet (4 mg total) by mouth every 8 (eight) hours as needed for up to 7 days for nausea or vomiting.   oxyCODONE 5 MG immediate release tablet Commonly known as:  Oxy IR/ROXICODONE Take 1-2 pills every 6 hrs as needed for pain   sildenafil 20 MG tablet Commonly known as:  REVATIO Take 2-5 tablets (40-100 mg total) by mouth daily as needed. What changed:  reasons to take this

## 2018-04-05 ENCOUNTER — Other Ambulatory Visit: Payer: Medicare HMO

## 2018-04-05 DIAGNOSIS — C61 Malignant neoplasm of prostate: Secondary | ICD-10-CM

## 2018-04-06 LAB — PSA: PROSTATE SPECIFIC AG, SERUM: 3.7 ng/mL (ref 0.0–4.0)

## 2018-04-08 ENCOUNTER — Encounter: Payer: Self-pay | Admitting: Urology

## 2018-04-08 ENCOUNTER — Ambulatory Visit: Payer: Medicare HMO | Admitting: Urology

## 2018-04-08 VITALS — BP 127/76 | HR 73 | Ht 68.0 in | Wt 184.7 lb

## 2018-04-08 DIAGNOSIS — N400 Enlarged prostate without lower urinary tract symptoms: Secondary | ICD-10-CM | POA: Diagnosis not present

## 2018-04-08 DIAGNOSIS — C61 Malignant neoplasm of prostate: Secondary | ICD-10-CM

## 2018-04-08 NOTE — Progress Notes (Signed)
04/08/2018 8:49 AM   Andre Jackson 01-21-1946 [id number]  Referring provider: Baxter Hire, MD [place], [place] [address]  Chief Complaint  Patient presents with  . 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  IPSS: 2, QOL 0 SHIM: 12--patient tried sildenafil, 40 mg, unsuccessfully.  He returns and PSA remains lower at 3.7. He is voiding well. No dysuria or gross hematuria.   PMH: Past Medical History:  Diagnosis Date  . Arthritis    "knees, hips" (04/03/2015)  . Cataract immature   . Diverticulosis   . Hx of colonic polyps   . Hyperlipidemia    takes Lipitor daily  . Hypertension    takes Prinizide daily  . Joint pain   . Joint swelling   . Left knee DJD   . Seasonal allergies    takes Allegra daily    Surgical History: Past Surgical History:  Procedure Laterality Date  . BACK SURGERY    . COLONOSCOPY W/ BIOPSIES AND POLYPECTOMY  07/2011   "have had it once before that also"  . I&D EXTREMITY Left ~ 2010   knee  . JOINT REPLACEMENT    . KNEE ARTHROSCOPY Left ~ 2011  . LIPOMA EXCISION  07/2011   neck  . LUMBAR DISC SURGERY  1970's  . MULTIPLE TOOTH EXTRACTIONS  ~ 2007  . 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 REVERSE SHOULDER ARTHROPLASTY Left 12/02/2017   Procedure: REVERSE SHOULDER ARTHROPLASTY;  Surgeon: Hiram Gash, MD;  Location: [facility];  Service: Orthopedics;  Laterality: Left;  . TONSILLECTOMY AND ADENOIDECTOMY  1940's  . TOTAL HIP ARTHROPLASTY Left 04/03/2015  . TOTAL HIP ARTHROPLASTY Left 04/03/2015   Procedure: TOTAL HIP ARTHROPLASTY ANTERIOR APPROACH;  Surgeon: Renette Butters, MD;  Location: [facility];  Service: Orthopedics;  Laterality: Left;  . TOTAL KNEE ARTHROPLASTY  04/05/2012     Procedure: TOTAL KNEE ARTHROPLASTY;  Surgeon: Lorn Junes, MD;  Location: [facility];  Service: Orthopedics;  Laterality: Right;  DR [place] THIS CASE  . TOTAL KNEE ARTHROPLASTY  08/02/2012   Procedure: TOTAL KNEE ARTHROPLASTY;  Surgeon: Lorn Junes, MD;  Location: [facility];  Service: Orthopedics;  Laterality: Left;    Home Medications:  Allergies as of 04/08/2018   No Known Allergies     Medication List        Accurate as of 04/08/18  8:49 AM. Always use your most recent med list.          aspirin EC 81 MG tablet Take 81 mg by mouth daily.   atorvastatin 10 MG tablet Commonly known as:  LIPITOR Take 10 mg by mouth daily.   lisinopril-hydrochlorothiazide 20-25 MG tablet Commonly known as:  PRINZIDE,ZESTORETIC Take 1 tablet by mouth daily.   meloxicam 7.5 MG tablet Commonly known as:  MOBIC Take 1 tablet (7.5 mg total) by mouth daily.   naproxen sodium 220 MG tablet Commonly known as:  ALEVE Take 220 mg by mouth daily as needed (for pain or headache).   omeprazole 20 MG capsule Commonly known as:  PRILOSEC Take 1 capsule (20 mg total) by mouth daily for 14 days.   sildenafil 20 MG tablet Commonly known as:  REVATIO Take 2-5 tablets (40-100 mg total) by mouth daily as needed.       Allergies: No Known Allergies  Family History: Family History  Problem Relation Age of Onset  . Cerebral aneurysm Mother   . Hypertension Mother   . Hypertension Sister   . Hypertension Brother   . Prostate cancer Neg Hx     Social History:  reports that he quit smoking about 9 years ago. His smoking use included cigarettes. He has a 14.52 pack-year smoking history. He has never used smokeless tobacco. He reports that he does not drink alcohol or use drugs.  ROS: UROLOGY Frequent Urination?: No Hard to postpone urination?: No Burning/pain with urination?: No Get up at night to urinate?: No Leakage of urine?: No Urine stream starts and stops?:  No Trouble starting stream?: No Do you have to strain to urinate?: No Blood in urine?: No Urinary tract infection?: No Sexually transmitted disease?: No Injury to kidneys or bladder?: No Painful intercourse?: No Weak stream?: No Erection problems?: No Penile pain?: No  Gastrointestinal Nausea?: No Vomiting?: No Indigestion/heartburn?: No Diarrhea?: No Constipation?: No  Constitutional Fever: No Night sweats?: No Weight loss?: No Fatigue?: No  Skin Skin rash/lesions?: No Itching?: No  Eyes Blurred vision?: No Double vision?: No  Ears/Nose/Throat Sore throat?: No Sinus problems?: No  Hematologic/Lymphatic Swollen glands?: No Easy bruising?: No  Cardiovascular Leg swelling?: No Chest pain?: No  Respiratory Cough?: No Shortness of breath?: No  Endocrine Excessive thirst?: No  Musculoskeletal Back pain?: No Joint pain?: No  Neurological Headaches?: No Dizziness?: No  Psychologic Depression?: No Anxiety?: No  Physical Exam: BP 127/76 (BP Location: Right Arm, Patient Position: Sitting, Cuff Size: Large)   Pulse 73   Ht 5\' 8"  (1.727 m)   Wt 83.8 kg (184 lb 11.2 oz)   BMI 28.08 kg/m   Constitutional:  Alert and oriented, No acute distress. HEENT:  AT, moist mucus membranes.  Trachea midline, no masses. Cardiovascular: No clubbing, cyanosis, or edema. Respiratory: Normal respiratory effort, no increased work of breathing. GI: Abdomen is soft, nontender, nondistended, no abdominal masses GU: No CVA tenderness DRE: 60 grams, no hard area or nodule. Landmarks preserved.  Lymph: No cervical or inguinal lymphadenopathy. Skin: No rashes, bruises or suspicious lesions. Neurologic: Grossly intact, no focal deficits, moving all 4 extremities. Psychiatric: Normal mood and affect.  Laboratory Data: Lab Results  Component Value Date   WBC 7.0 11/20/2017   HGB 14.8 11/20/2017   HCT 44.4 11/20/2017   MCV 89.7 11/20/2017   PLT 255 11/20/2017     Lab Results  Component Value Date   CREATININE 1.09 11/20/2017    No results found for: PSA  No results found for: TESTOSTERONE  No results found for: HGBA1C  Urinalysis    Component Value Date/Time   COLORURINE YELLOW 03/23/2015 [place] 03/23/2015 1323   LABSPEC 1.019 03/23/2015 1323   PHURINE 5.0 03/23/2015 1323   GLUCOSEU NEGATIVE 03/23/2015 1323   HGBUR NEGATIVE 03/23/2015 1323   BILIRUBINUR NEGATIVE 03/23/2015 1323   KETONESUR NEGATIVE 03/23/2015 1323   PROTEINUR NEGATIVE 03/23/2015 1323   UROBILINOGEN 0.2 03/23/2015 1323   NITRITE NEGATIVE 03/23/2015 1323   LEUKOCYTESUR NEGATIVE 03/23/2015 1323    No results found for: LABMICR, WBCUA, RBCUA, LABEPIT, MUCUS, BACTERIA   No results found for this or any previous visit. No results found for this or any previous visit. No results found for this or any previous visit. No results found for this  or any previous visit. Results for orders placed during the hospital encounter of 10/03/15  US Renal   Narrative CLINICAL DATA:  Microscopic hematuria found 2-3 weeks ago, no flank pain.  EXAM: RENAL / URINARY TRACT ULTRASOUND COMPLETE  COMPARISON:  None.  FINDINGS: Right Kidney:  Length: 11.8 cm. Echogenicity within normal limits. There is a lower pole nonobstructing shadowing stone measuring 1.5 cm in greatest dimension. No mass or hydronephrosis visualized.  Left Kidney:  Length: 9.7 cm. Echogenicity within normal limits. No mass or hydronephrosis visualized.  Bladder:  The prostate gland is enlarged and produces a moderate impression upon the urinary bladder base. The prostate gland measures 5.6 x 5.9 x 5.9 cm. Bilateral ureteral jets are observed in the bladder. The bladder itself appears normal with exception of the basilar prostatic impression.  IMPRESSION: 1.5 cm nonobstructing stone in the lower pole of the right kidney. Enlargement of the prostate gland which produces a  moderate impression upon the urinary bladder base.   Electronically Signed   By: David  Martinique M.D.   On: 10/03/2015 13:52    No results found for this or any previous visit. No results found for this or any previous visit. No results found for this or any previous visit.  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 Discussed nature r/b of continued surveillance, treatment, repeat bx or MRI. All questions answered. Will set up for MRI in 3- 4 months and have him return to review. PSAD is normal.   No follow-ups on file.  Festus Aloe, MD  [facility] [address], [place], [place] [address] [phone]

## 2018-06-18 ENCOUNTER — Telehealth: Payer: Self-pay | Admitting: Urology

## 2018-06-18 NOTE — Telephone Encounter (Signed)
You ordered an MRI for this patient and his insurance denied it. Stated per the guidelines with his insurance it did not meet approval since he has not had an abnormal DRE exam in the past year or elevated PSA. I have canceled his MRI. Please let me know what if anything you want to do.   Sharyn Lull

## 2018-06-21 ENCOUNTER — Other Ambulatory Visit: Payer: Medicare HMO

## 2018-06-25 NOTE — Telephone Encounter (Signed)
I will arrange a peer to peer but I need to know your schedule for next week and when you will be available  Sharyn Lull

## 2018-06-29 NOTE — Telephone Encounter (Signed)
Anytime tomorrow or Thursday afternoon -- thanks!

## 2018-07-01 ENCOUNTER — Telehealth: Payer: Self-pay | Admitting: Urology

## 2018-07-01 NOTE — Telephone Encounter (Signed)
appts have been scheduled Mailed to patient  Andre Jackson

## 2018-07-01 NOTE — Telephone Encounter (Signed)
error 

## 2018-07-01 NOTE — Telephone Encounter (Signed)
-----   Message from Festus Aloe, MD sent at 06/30/2018  1:07 PM EDT ----- Regarding: RE: denied MRI Thank you for sending over the denial and looking into the MRI approval process. Looks like they are not going to approve the MRI.  Let the pt know I was hoping he could avoid another biopsy by getting an MRI but his insurance requires another biopsy before an MRI is approved. Looks like they have [place] cornered into another biopsy.   He should return with an MD in a month or two with a PSA prior to discuss -- thanks.    ----- Message ----- From: Benard Halsted Sent: 06/29/2018   4:44 PM EDT To: Festus Aloe, MD Subject: denied MRI                                     I called to set up the peer to peer and was told that even if we did that it would not make a difference int he denial and that the only way to get it over turned would be to do an appeal. Which means they want more information from [place] that we haven't sent them. Like a letter of medical necessity for the MRI. If you could write one explaining why he needs it I can send it to them. We have been having to this this with most of our patient's that have this insurance and they have been approving them. Hopefully they will for him as well.   Sharyn Lull

## 2018-07-06 ENCOUNTER — Telehealth: Payer: Self-pay | Admitting: Urology

## 2018-07-06 NOTE — Telephone Encounter (Signed)
appts have been scheduled  Sharyn Lull

## 2018-07-06 NOTE — Telephone Encounter (Signed)
-----   Message from Festus Aloe, MD sent at 06/30/2018  1:07 PM EDT ----- Regarding: RE: denied MRI Thank you for sending over the denial and looking into the MRI approval process. Looks like they are not going to approve the MRI.  Let the pt know I was hoping he could avoid another biopsy by getting an MRI but his insurance requires another biopsy before an MRI is approved. Looks like they have [place] cornered into another biopsy.   He should return with an MD in a month or two with a PSA prior to discuss -- thanks.    ----- Message ----- From: Benard Halsted Sent: 06/29/2018   4:44 PM EDT To: Festus Aloe, MD Subject: denied MRI                                     I called to set up the peer to peer and was told that even if we did that it would not make a difference int he denial and that the only way to get it over turned would be to do an appeal. Which means they want more information from [place] that we haven't sent them. Like a letter of medical necessity for the MRI. If you could write one explaining why he needs it I can send it to them. We have been having to this this with most of our patient's that have this insurance and they have been approving them. Hopefully they will for him as well.   Sharyn Lull

## 2018-07-08 ENCOUNTER — Ambulatory Visit: Payer: Medicare HMO | Admitting: Urology

## 2018-08-02 ENCOUNTER — Other Ambulatory Visit: Payer: Medicare HMO

## 2018-08-02 DIAGNOSIS — C61 Malignant neoplasm of prostate: Secondary | ICD-10-CM

## 2018-08-03 LAB — PSA: Prostate Specific Ag, Serum: 4.7 ng/mL — ABNORMAL HIGH (ref 0.0–4.0)

## 2018-08-16 ENCOUNTER — Ambulatory Visit: Payer: Medicare HMO | Admitting: Urology

## 2018-08-16 ENCOUNTER — Other Ambulatory Visit: Payer: Self-pay

## 2018-08-16 ENCOUNTER — Telehealth: Payer: Self-pay

## 2018-08-16 ENCOUNTER — Encounter: Payer: Self-pay | Admitting: Urology

## 2018-08-16 VITALS — BP 147/91 | HR 78 | Ht 68.0 in | Wt 188.2 lb

## 2018-08-16 DIAGNOSIS — C61 Malignant neoplasm of prostate: Secondary | ICD-10-CM | POA: Diagnosis not present

## 2018-08-16 MED ORDER — DIAZEPAM 5 MG PO TABS: 5.0000 mg | ORAL_TABLET | Freq: Once | ORAL | 0 refills | Status: DC | PRN

## 2018-08-16 NOTE — Progress Notes (Signed)
   08/16/2018 11:59 AM   Andre Jackson Jul 28, 1946 [id number]  Reason for visit: Follow up favorable intermediate risk prostate cancer  HPI: I had the pleasure of meeting Andre Jackson in urology clinic today regarding his diagnosis of favorable intermediate risk prostate cancer.  Briefly is a 72 year old African-American male with a family history of nonlethal prostate cancer who was diagnosed with intermediate risk prostate cancer in September 2018 when he underwent a prostate biopsy for PSA of 4.7.  Prostate volume was 75 g which equated to a PSA density of 0.06, and 2/12 cores were positive.  One core was positive for Gleason score 3+3 equal 6 and 20% of the tissue involved, and an additional core was notable for Gleason score 3+4 = 7 with only 2% of tissue involved.  He had elected for active surveillance, however has not yet undergone any repeat biopsy.  He was previously followed by Dr. Junious Silk, who had recommended a prostate MRI.  His PSA today is 4.7 from 3.7 previously, but stable from 4.7 at time of diagnosis.  He denies any weight loss or bone pain   ROS: Please see flowsheet from today's date for complete review of systems.  Physical Exam: BP (!) 147/91   Pulse 78   Ht 5\' 8"  (1.727 m)   Wt 188 lb 3.2 oz (85.4 kg)   BMI 28.62 kg/m    Constitutional:  Alert and oriented, No acute distress. Respiratory: Normal respiratory effort, no increased work of breathing. GI: Abdomen is soft, nontender, nondistended, no abdominal masses GU: No CVA tenderness DRE: Deferred until time of biopsy Skin: No rashes, bruises or suspicious lesions. Neurologic: Grossly intact, no focal deficits, moving all 4 extremities. Psychiatric: Normal mood and affect  Laboratory Data:  PSA HISTORY 07/2018: 4.7 03/2018: 3.7 09/2017: 3.6 05/2017: 4.7 11/2016: 3.8  Pertinent Imaging: None to review  Assessment & Plan:   In summary, Andre Jackson is a healthy 72 year old African-American male  diagnosed with favorable intermediate risk prostate cancer in September 2018.  PSA density is reassuring at 0.06, and PSA has been stable since time of diagnosis.  He has not yet undergone a confirmatory biopsy.  We had a long discussion about intermediate risk prostate cancer and options including treatment with radiation or surgery, versus active surveillance being an option in patients that are older with favorable intermediate risk disease.  He is in agreement with proceeding with a prostate biopsy, with a goal of continuing active surveillance of his disease is stable.  We discussed the risks and benefits of active surveillance at length, as well as the risks and benefits of prostate biopsy including 1% risk of bleeding or infection requiring hospitalization.  Return for prostate biopsy after 10/2018.  Billey Co, [facility] [address], [place], [place] [address] [phone]

## 2018-08-16 NOTE — Telephone Encounter (Signed)
I have faxed clearance to Dr. Edwina Barth at [place] for pt to stop Aspirin t days prior to Prostate Biopsy.

## 2018-09-16 ENCOUNTER — Ambulatory Visit: Payer: Medicare HMO

## 2018-10-19 ENCOUNTER — Other Ambulatory Visit: Payer: Self-pay | Admitting: Urology

## 2018-10-19 ENCOUNTER — Ambulatory Visit: Payer: Medicare HMO | Admitting: Urology

## 2018-10-19 ENCOUNTER — Encounter: Payer: Self-pay | Admitting: Urology

## 2018-10-19 VITALS — BP 146/85 | HR 86 | Ht 68.0 in | Wt 183.2 lb

## 2018-10-19 DIAGNOSIS — C61 Malignant neoplasm of prostate: Secondary | ICD-10-CM

## 2018-10-19 DIAGNOSIS — R972 Elevated prostate specific antigen [PSA]: Secondary | ICD-10-CM | POA: Diagnosis not present

## 2018-10-19 MED ORDER — LEVOFLOXACIN 500 MG PO TABS
500.0000 mg | ORAL_TABLET | Freq: Once | ORAL | Status: AC
  Administered 2018-10-19: 500 mg via ORAL

## 2018-10-19 MED ORDER — GENTAMICIN SULFATE 40 MG/ML IJ SOLN
80.0000 mg | Freq: Once | INTRAMUSCULAR | Status: AC
  Administered 2018-10-19: 80 mg via INTRAMUSCULAR

## 2018-10-19 NOTE — Progress Notes (Signed)
   10/19/18  Indication: Hx of favorable intermediate risk PCa, confirmatory biopsy today  Prostate Biopsy Procedure   Informed consent was obtained, and we discussed the risks of bleeding and infection/sepsis. A time out was performed to ensure correct patient identity.  Pre-Procedure: - Last PSA Level: 4.7 - Gentamicin and levaquin given for antibiotic prophylaxis - Transrectal Ultrasound performed revealing a 90 gm prostate, PSA density 0.05 - No significant hypoechoic or median lobe noted  Procedure: - Prostate block performed using 10 cc 1% lidocaine and biopsies taken from sextant areas, a total of 12 under ultrasound guidance.  Post-Procedure: - Patient tolerated the procedure well - He was counseled to seek immediate medical attention if experiences significant bleeding, fevers, or severe pain - Return in one week to discuss biopsy results  Assessment/ Plan: Will follow up in 1-2 weeks to discuss pathology  Nickolas Madrid, MD 10/19/2018

## 2018-10-21 ENCOUNTER — Other Ambulatory Visit: Payer: Self-pay | Admitting: Urology

## 2018-10-21 LAB — PATHOLOGY REPORT

## 2018-11-02 ENCOUNTER — Encounter: Payer: Self-pay | Admitting: Urology

## 2018-11-02 ENCOUNTER — Ambulatory Visit (INDEPENDENT_AMBULATORY_CARE_PROVIDER_SITE_OTHER): Payer: Medicare HMO | Admitting: Urology

## 2018-11-02 VITALS — BP 132/84 | HR 86 | Ht 68.0 in | Wt 183.0 lb

## 2018-11-02 DIAGNOSIS — C61 Malignant neoplasm of prostate: Secondary | ICD-10-CM

## 2018-11-02 MED ORDER — SILDENAFIL CITRATE 20 MG PO TABS: 20.0000 mg | ORAL_TABLET | ORAL | 11 refills | Status: DC | PRN

## 2018-11-02 NOTE — Patient Instructions (Signed)
Prostate Cancer  The prostate is a walnut-sized gland that is involved in the production of semen. It is located below a man's bladder, in front of the rectum. Prostate cancer is the abnormal growth of cells in the prostate gland. What are the causes? The exact cause of this condition is not known. What increases the risk? This condition is more likely to develop in men who:  Are older than age 75.  Are African-American.  Are obese.  Have a family history of prostate cancer.  Have a family history of breast cancer. What are the signs or symptoms? Symptoms of this condition include:  A need to urinate often.  Weak or interrupted flow of urine.  Trouble starting or stopping urination.  Inability to urinate.  Pain or burning during urination.  Painful ejaculation.  Blood in urine or semen.  Persistent pain or discomfort in the lower back, lower abdomen, hips, or upper thighs.  Trouble getting an erection.  Trouble emptying the bladder all the way. How is this diagnosed? This condition can be diagnosed with:  A digital rectal exam. For this exam, a health care provider inserts a gloved finger into the rectum to feel the prostate gland.  A blood test called a prostate-specific antigen (PSA) test.  An imaging test called transrectal ultrasonography.  A procedure in which a sample of tissue is taken from the prostate and examined under a microscope (prostate biopsy). Once the condition is diagnosed, tests will be done to determine how far the cancer has spread. This is called staging the cancer. Staging may involve imaging tests, such as:  A bone scan.  A CT scan.  A PET scan.  An MRI. The stages of prostate cancer are as follows:  Stage I. At this stage, the cancer is found in the prostate only. The cancer is not visible on imaging tests and it is usually found by accident, such as during a prostate surgery.  Stage II. At this stage, the cancer is more advanced  than it is in stage I, but the cancer has not spread outside the prostate.  Stage III. At this stage, the cancer has spread beyond the outer layer of the prostate to nearby tissues. The cancer may be found in the seminal vesicles, which are near the bladder and the prostate.  Stage IV. At this stage, the cancer has spread other parts of the body, such as the lymph nodes, bones, bladder, rectum, liver, or lungs. How is this treated? Treatment for this condition depends on several factors, including the stage of the cancer, your age, personal preferences, and your overall health. Talk with your health care provider about treatment options that are recommended for you. Common treatments include:  Observation for early stage prostate cancer (active surveillance). This involves having exams, blood tests, and in some cases, more biopsies. For some men, this is the only treatment needed.  Surgery. Types of surgeries include: ? Open surgery. In this surgery, a larger incision is made to remove the prostate. ? A laparoscopic prostatectomy. This is a surgery to remove the prostate and lymph nodes through several, small incisions. It is often referred to as a minimally invasive surgery. ? A robotic prostatectomy. This is a surgery to remove the prostate and lymph nodes with the help of a robotic arm that is controlled by a computer. ? Orchiectomy. This is a surgery to remove the testicles. ? Cryosurgery. This is a surgery to freeze and destroy cancer cells.  Radiation treatment. Types   of radiation treatment include: ? External beam radiation. This type aims beams of radiation from outside the body at the prostate to destroy cancerous cells. ? Brachytherapy. This type uses radioactive needles, seeds, wires, or tubes that are implanted into the prostate gland. Like external beam radiation, brachytherapy destroys cancerous cells. An advantage is that this type of radiation limits the damage to surrounding  tissue and has fewer side effects.  High-intensity, focused ultrasonography. This treatment destroys cancer cells by delivering high-energy ultrasound waves to the cancerous cells.  Chemotherapy medicines. This treatment kills cancer cells or stops them from multiplying.  Hormone treatment. This treatment involves taking medicines that act on one of the male hormones (testosterone): ? By stopping your body from producing testosterone. ? By blocking testosterone from reaching cancer cells. Follow these instructions at home:  Take over-the-counter and prescription medicines only as told by your health care provider.  Maintain a healthy diet.  Get plenty of sleep.  Consider joining a support group for men who have prostate cancer. Meeting with a support group may help you learn to cope with the stress of having cancer.  Keep all follow-up visits as told by your health care provider. This is important.  If you have to go to the hospital, notify your cancer specialist (oncologist).  Treatment for prostate cancer may affect sexual function. Continue to have intimate moments with your partner. This may include touching, holding, hugging, and caressing. Contact a health care provider if:  You have trouble urinating.  You have blood in your urine.  You have pain in your hips, back, or chest. Get help right away if:  You have weakness or numbness in your legs.  You cannot control urination or your bowel movements (incontinence).  You have trouble breathing.  You have sudden chest pain.  You have chills or a fever. Summary  The prostate is a walnut-sized gland that is involved in the production of semen. It is located below a man's bladder, in front of the rectum. Prostate cancer is the abnormal growth of cells in the prostate gland.  Treatment for this condition depends on several factors, including the stage of the cancer, your age, personal preferences, and your overall health.  Talk with your health care provider about treatment options that are recommended for you.  Consider joining a support group for men who have prostate cancer. Meeting with a support group may help you learn to cope with the stress of having cancer. This information is not intended to replace advice given to you by your health care provider. Make sure you discuss any questions you have with your health care provider. Document Released: 09/29/2005 Document Revised: 06/03/2017 Document Reviewed: 06/09/2016 Elsevier Interactive Patient Education  2019 Elsevier Inc.  

## 2018-11-02 NOTE — Progress Notes (Signed)
11/02/2018 11:41 AM   Andre Jackson [birth year] [id number]  Reason for visit: Follow up prostate biopsy results  HPI: I saw [name] back in urology clinic to discuss his confirmatory biopsy results.  Briefly, he is a 73 year old African-American male with a family history of non--lethal prostate cancer who was originally diagnosed with favorable intermediate risk prostate cancer in September 2018 when he underwent prostate biopsy for a PSA of 4.7 with Dr. Junious Silk.  Only 2/12 cores were positive, with 1 showing Gleason score 3+3=6 with 20% of the core involved, and an additional core of 3+4 = 7 with only 2% of tissue involved.  PSA has been stable at 4.7, and he underwent confirmatory biopsy with myself on 10/19/2018.  This demonstrated a 90 g gland, with PSA density of 0.05.  Biopsy results attached below, demonstrating 8/12 cores positive for primarily Gleason score 3+4=7 disease, with significant core involvement.    He denies any significant urinary symptoms at baseline and reports he voids with a strong stream and no nocturia.  He denies urgency or frequency.  He has erectile dysfunction that is moderately improved with sildenafil.  We had a lengthy conversation today about the patient's diagnosis of prostate cancer.  We reviewed the risk classifications per the AUA guidelines including very low risk, low risk, intermediate risk, and high risk disease, and the need for additional staging imaging with CT and bone scan in patients with unfavorable intermediate risk and high risk disease.  I explained that his life expectancy, clinical stage, Gleason score, PSA, and other co-morbidities influence treatment strategies.  We discussed the roles of active surveillance, radiation therapy, surgical therapy with robotic prostatectomy, and hormone therapy with androgen deprivation.  We discussed that patients urinary symptoms also impact treatment strategy, as patients with severe lower urinary  tract symptoms may have significant worsening or even develop urinary retention after undergoing radiation.  In regards to surgery, we discussed robotic prostatectomy +/- lymphadenectomy at length.  The procedure takes 3 to 4 hours, and patient's typically discharge home on post-op day #1.  A Foley catheter is left in place for 7 to 10 days to allow for healing of the vesicourethral anastomosis.  There is a small risk of bleeding, infection, damage to surrounding structures or bowel, hernia, DVT/PE, or serious cardiac or pulmonary complications.  We discussed at length post-op side effects including erectile dysfunction, and the importance of pre-operative erectile function on long-term outcomes.  Even with a nerve sparing approach, there is an approximately 25% rate of permanent erectile dysfunction.  We also discussed postop urinary incontinence at length.  We expect patients to have stress incontinence post-operatively that will improve over period of weeks to months.  Less than 10% of men will require a pad at 1 year after surgery.  Patients will need to avoid heavy lifting and strenuous activity for 3 to 4 weeks, but most men return to their baseline activity status by 6 weeks.  With his advanced age, he would not be a good candidate for surgery.    In summary, Andre Jackson is a 73 y.o. man with upstaging to high volume intermediate risk prostate cancer. He would like to pursue radiation.  Referral placed to radiation oncology Prescription and good Rx coupon provided for sildenafil 20 mg RTC 6 months with PSA  A total of 25 minutes were spent face-to-face with the patient, greater than 50% was spent in patient education, counseling, and coordination of care regarding upstaging of prostate cancer  to high volume favorable intermediate risk disease, risks and benefits of active surveillance versus intervention with radiation, and recommendation to pursue treatment.   Billey Co,  [facility] [address] [place], [place] [address] [phone]

## 2018-11-11 ENCOUNTER — Institutional Professional Consult (permissible substitution): Payer: Medicare HMO | Admitting: Radiation Oncology

## 2018-11-11 ENCOUNTER — Other Ambulatory Visit: Payer: Self-pay | Admitting: *Deleted

## 2018-11-12 ENCOUNTER — Other Ambulatory Visit: Payer: Self-pay

## 2018-11-12 ENCOUNTER — Encounter: Payer: Self-pay | Admitting: Radiation Oncology

## 2018-11-12 ENCOUNTER — Ambulatory Visit
Admission: RE | Admit: 2018-11-12 | Discharge: 2018-11-12 | Disposition: A | Discharge status: Home / Self Care | Payer: Medicare HMO | Source: Ambulatory Visit | Attending: Radiation Oncology | Admitting: Radiation Oncology

## 2018-11-12 VITALS — BP 138/79 | HR 89 | Temp 96.7°F | Resp 18 | Wt 187.1 lb

## 2018-11-12 DIAGNOSIS — C61 Malignant neoplasm of prostate: Secondary | ICD-10-CM

## 2018-11-12 DIAGNOSIS — E785 Hyperlipidemia, unspecified: Secondary | ICD-10-CM | POA: Diagnosis not present

## 2018-11-12 DIAGNOSIS — Z87891 Personal history of nicotine dependence: Secondary | ICD-10-CM | POA: Diagnosis not present

## 2018-11-12 DIAGNOSIS — Z86010 Personal history of colonic polyps: Secondary | ICD-10-CM | POA: Diagnosis not present

## 2018-11-12 DIAGNOSIS — Z8719 Personal history of other diseases of the digestive system: Secondary | ICD-10-CM | POA: Diagnosis not present

## 2018-11-12 DIAGNOSIS — M1712 Unilateral primary osteoarthritis, left knee: Secondary | ICD-10-CM | POA: Diagnosis not present

## 2018-11-12 DIAGNOSIS — Z79899 Other long term (current) drug therapy: Secondary | ICD-10-CM | POA: Diagnosis not present

## 2018-11-12 DIAGNOSIS — Z7982 Long term (current) use of aspirin: Secondary | ICD-10-CM | POA: Insufficient documentation

## 2018-11-12 DIAGNOSIS — I10 Essential (primary) hypertension: Secondary | ICD-10-CM | POA: Diagnosis not present

## 2018-11-12 NOTE — Consult Note (Signed)
NEW PATIENT EVALUATION  Name: Andre Jackson  MRN: [id number]  Date:   11/12/2018     DOB: June 23, 1946   This 73 y.o. 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  REFERRING PHYSICIAN: Baxter Hire, MD  CHIEF COMPLAINT:  Chief Complaint  Patient presents with  . Prostate Cancer    Initial consultation    DIAGNOSIS: The encounter diagnosis was Prostate cancer ([place]).   PREVIOUS INVESTIGATIONS:  Pathology reports reviewed Clinical notes reviewed   HPI: patient is a 73 year old male presented with a slightly elevated PSA back in September 2018. PSA is 4.7. He had transrectal ultrasound-guided biopsy showing 2 of 12 cores positive for Gleason 6 (3+3). They opted for will watchful waiting. He was seen by Dr. Claudie Leach and confirmatory biopsy now showed 8 of 12 cores positive for Gleason mostly 7 (3+4). He had a 90 cc gland. Patient is very little lower urinary tract symptoms no significant nocturia urgency or frequency. He has not had a bone scan based on his low PSA which would not trigger that examination. He is been declined for surgery based on his age and is now referred to radiation oncology for consideration of treatment.  PLANNED TREATMENT REGIMEN: IMRTradiation therapy image guided  PAST MEDICAL HISTORY:  has a past medical history of Arthritis, Cataract immature, Diverticulosis, colonic polyps, Hyperlipidemia, Hypertension, Joint pain, Joint swelling, Left knee DJD, and Seasonal allergies.    PAST SURGICAL HISTORY:  Past Surgical History:  Procedure Laterality Date  . BACK SURGERY    . COLONOSCOPY W/ BIOPSIES AND POLYPECTOMY  07/2011   "have had it once before that also"  . I&D EXTREMITY Left ~ 2010   knee  . JOINT REPLACEMENT    . KNEE ARTHROSCOPY Left ~ 2011  . LIPOMA EXCISION  07/2011   neck  . LUMBAR DISC SURGERY  1970's  . MULTIPLE TOOTH EXTRACTIONS  ~ 2007   . 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 REVERSE SHOULDER ARTHROPLASTY Left 12/02/2017   Procedure: REVERSE SHOULDER ARTHROPLASTY;  Surgeon: Hiram Gash, MD;  Location: [facility];  Service: Orthopedics;  Laterality: Left;  . TONSILLECTOMY AND ADENOIDECTOMY  1940's  . TOTAL HIP ARTHROPLASTY Left 04/03/2015  . TOTAL HIP ARTHROPLASTY Left 04/03/2015   Procedure: TOTAL HIP ARTHROPLASTY ANTERIOR APPROACH;  Surgeon: Renette Butters, MD;  Location: [facility];  Service: Orthopedics;  Laterality: Left;  . TOTAL KNEE ARTHROPLASTY  04/05/2012   Procedure: TOTAL KNEE ARTHROPLASTY;  Surgeon: Lorn Junes, MD;  Location: [facility];  Service: Orthopedics;  Laterality: Right;  DR Christopher THIS CASE  . TOTAL KNEE ARTHROPLASTY  08/02/2012   Procedure: TOTAL KNEE ARTHROPLASTY;  Surgeon: Lorn Junes, MD;  Location: [facility];  Service: Orthopedics;  Laterality: Left;    FAMILY HISTORY: family history includes Cerebral aneurysm in his mother; Hypertension in his brother, mother, and sister.  SOCIAL HISTORY:  reports that he quit smoking about 9 years ago. His smoking use included cigarettes. He has a 14.52 pack-year smoking history. He has never used smokeless tobacco. He reports that he does not drink alcohol or use drugs.  ALLERGIES: Patient has no known allergies.  MEDICATIONS:  Current Outpatient Medications  Medication Sig Dispense Refill  . aspirin EC 81 MG tablet Take 81 mg by mouth daily.    Marland Kitchen atorvastatin (LIPITOR) 10 MG tablet Take 10 mg  by mouth daily.    . diazepam (VALIUM) 5 MG tablet Take 1 tablet (5 mg total) by mouth once as needed for up to 1 dose for anxiety (take 30 minutes prior to prostate biopsy). 1 tablet 0  . fluticasone (FLONASE) 50 MCG/ACT nasal spray Place into the nose.    Marland Kitchen lisinopril-hydrochlorothiazide (PRINZIDE,ZESTORETIC) 20-25 MG per tablet Take 1 tablet by mouth daily.    . meloxicam (MOBIC) 15 MG tablet Take 15 mg by mouth daily.    . meloxicam (MOBIC) 7.5 MG  tablet Take 1 tablet (7.5 mg total) by mouth daily. 30 tablet 2  . naproxen sodium (ALEVE) 220 MG tablet Take 220 mg by mouth daily as needed (for pain or headache).    . sildenafil (REVATIO) 20 MG tablet Take 1-5 tablets (20-100 mg total) by mouth as needed. 30 tablet 11  . sildenafil (REVATIO) 20 MG tablet Take 2-5 tablets (40-100 mg total) by mouth daily as needed. (Patient not taking: Reported on 11/02/2018) 50 tablet 4   No current facility-administered medications for this encounter.     ECOG PERFORMANCE STATUS:  0 - Asymptomatic  REVIEW OF SYSTEMS:  Patient denies any weight loss, fatigue, weakness, fever, chills or night sweats. Patient denies any loss of vision, blurred vision. Patient denies any ringing  of the ears or hearing loss. No irregular heartbeat. Patient denies heart murmur or history of fainting. Patient denies any chest pain or pain radiating to her upper extremities. Patient denies any shortness of breath, difficulty breathing at night, cough or hemoptysis. Patient denies any swelling in the lower legs. Patient denies any nausea vomiting, vomiting of blood, or coffee ground material in the vomitus. Patient denies any stomach pain. Patient states has had normal bowel movements no significant constipation or diarrhea. Patient denies any dysuria, hematuria or significant nocturia. Patient denies any problems walking, swelling in the joints or loss of balance. Patient denies any skin changes, loss of hair or loss of weight. Patient denies any excessive worrying or anxiety or significant depression. Patient denies any problems with insomnia. Patient denies excessive thirst, polyuria, polydipsia. Patient denies any swollen glands, patient denies easy bruising or easy bleeding. Patient denies any recent infections, allergies or URI. Patient "s visual fields have not changed significantly in recent time.    PHYSICAL EXAM: BP 138/79 (BP Location: Left Arm, Patient Position: Sitting)    Pulse 89   Temp (!) 96.7 F (35.9 C) (Tympanic)   Resp 18   Wt 187 lb 1 oz (84.8 kg)   BMI 28.44 kg/m  On rectal exam rectal sphincter tone is good prostate is enlarged no discrete nodularity is noted gland is firm throughout. Sulcus is preserved bilaterally.Well-developed well-nourished patient in NAD. HEENT reveals PERLA, EOMI, discs not visualized.  Oral cavity is clear. No oral mucosal lesions are identified. Neck is clear without evidence of cervical or supraclavicular adenopathy. Lungs are clear to A&P. Cardiac examination is essentially unremarkable with regular rate and rhythm without murmur rub or thrill. Abdomen is benign with no organomegaly or masses noted. Motor sensory and DTR levels are equal and symmetric in the upper and lower extremities. Cranial nerves II through XII are grossly intact. Proprioception is intact. No peripheral adenopathy or edema is identified. No motor or sensory levels are noted. Crude visual fields are within normal range.  LABORATORY DATA: pathology reports reviewed    RADIOLOGY RESULTS:no current films for review   IMPRESSION: stage II a Gleason 7 (3+4) adenocarcinoma the prostate in 73 year old  male  PLAN: I have run the [facility] on his personal data which shows a 35% chance of organ confined disease with only a 5% chance of lymph node involvement. Based on the large size of his gland he is not a candidate for I-125 interstitial implant. I would recommend image guided radiation therapy to his prostate. I would plan on delivering 8000 cGy over 8 weeks. I've asked urology to place fiducial markers in his prostate for daily image guided treatment. Risks and benefits of treatment including increased lower urinary tract symptoms fatigue diarrhea skin reaction alteration of blood counts all were discussed in detail with the patient. He seems to comprehend my treatment plan well. We will set him up for CT simulation after his markers are  placed. Patient wife both comprehend my treatment plan well.  I would like to take this opportunity to thank you for allowing me to participate in the care of your patient.Noreene Filbert, MD

## 2018-11-15 ENCOUNTER — Telehealth: Payer: Self-pay | Admitting: Urology

## 2018-11-15 NOTE — Telephone Encounter (Signed)
This patient is coming in on 11-23-18 to have Gold seed markers placed with you.   Thanks, Sharyn Lull

## 2018-11-15 NOTE — Telephone Encounter (Signed)
-----   Message from Christean Grief, RN sent at 11/12/2018 10:11 AM EST ----- Regarding: Daphene Jaeger Marker Placement This patient will need to be scheduled for [facility] Marker placement.  I have sent the markers with the patient.   Thanks, Ranelle Oyster

## 2018-11-23 ENCOUNTER — Ambulatory Visit (INDEPENDENT_AMBULATORY_CARE_PROVIDER_SITE_OTHER): Payer: Medicare HMO | Admitting: Urology

## 2018-11-23 ENCOUNTER — Encounter: Payer: Self-pay | Admitting: Urology

## 2018-11-23 VITALS — BP 111/76 | HR 66 | Ht 68.0 in | Wt 183.0 lb

## 2018-11-23 DIAGNOSIS — C61 Malignant neoplasm of prostate: Secondary | ICD-10-CM | POA: Diagnosis not present

## 2018-11-23 MED ORDER — LEVOFLOXACIN 500 MG PO TABS
500.0000 mg | ORAL_TABLET | Freq: Once | ORAL | Status: AC
  Administered 2018-11-23: 500 mg via ORAL

## 2018-11-23 MED ORDER — GENTAMICIN SULFATE 40 MG/ML IJ SOLN
80.0000 mg | Freq: Once | INTRAMUSCULAR | Status: AC
  Administered 2018-11-23: 80 mg via INTRAMUSCULAR

## 2018-11-23 NOTE — Progress Notes (Signed)
   11/23/18  Indication: Intermediate risk prostate cancer, plan for XRT  Procedure:  Informed consent was obtained, and we discussed the risks of bleeding and infection/sepsis. A time out was performed to ensure correct patient identity.  Pre-Procedure: - Gentamicin and levaquin given for antibiotic prophylaxis  Procedure: -Transrectal ultrasound of the prostate was performed.  A total of 3 gold seed markers were placed, at the left and right lateral base of the prostate, and at the apex.  Post-Procedure: - Patient tolerated the procedure well - He was counseled to seek immediate medical attention if experiences significant bleeding, fevers, or severe pain  Assessment/ Plan: Follow up with RadOnc for XRT  Nickolas Madrid, MD 11/23/2018

## 2018-11-29 ENCOUNTER — Ambulatory Visit
Admission: RE | Admit: 2018-11-29 | Discharge: 2018-11-29 | Disposition: A | Discharge status: Home / Self Care | Payer: Medicare HMO | Source: Ambulatory Visit | Attending: Radiation Oncology | Admitting: Radiation Oncology

## 2018-11-29 DIAGNOSIS — Z510 Encounter for antineoplastic radiation therapy: Secondary | ICD-10-CM | POA: Insufficient documentation

## 2018-11-29 DIAGNOSIS — C61 Malignant neoplasm of prostate: Secondary | ICD-10-CM | POA: Insufficient documentation

## 2018-11-30 DIAGNOSIS — Z510 Encounter for antineoplastic radiation therapy: Secondary | ICD-10-CM | POA: Diagnosis not present

## 2018-12-03 ENCOUNTER — Other Ambulatory Visit: Payer: Self-pay | Admitting: *Deleted

## 2018-12-03 DIAGNOSIS — C61 Malignant neoplasm of prostate: Secondary | ICD-10-CM

## 2018-12-08 ENCOUNTER — Ambulatory Visit
Admission: RE | Admit: 2018-12-08 | Discharge: 2018-12-08 | Disposition: A | Discharge status: Home / Self Care | Payer: Medicare HMO | Source: Ambulatory Visit | Attending: Radiation Oncology | Admitting: Radiation Oncology

## 2018-12-09 ENCOUNTER — Ambulatory Visit
Admission: RE | Admit: 2018-12-09 | Discharge: 2018-12-09 | Disposition: A | Discharge status: Home / Self Care | Payer: Medicare HMO | Source: Ambulatory Visit | Attending: Radiation Oncology | Admitting: Radiation Oncology

## 2018-12-09 DIAGNOSIS — Z510 Encounter for antineoplastic radiation therapy: Secondary | ICD-10-CM | POA: Diagnosis not present

## 2018-12-10 ENCOUNTER — Ambulatory Visit
Admission: RE | Admit: 2018-12-10 | Discharge: 2018-12-10 | Disposition: A | Discharge status: Home / Self Care | Payer: Medicare HMO | Source: Ambulatory Visit | Attending: Radiation Oncology | Admitting: Radiation Oncology

## 2018-12-10 DIAGNOSIS — Z510 Encounter for antineoplastic radiation therapy: Secondary | ICD-10-CM | POA: Diagnosis not present

## 2018-12-13 ENCOUNTER — Ambulatory Visit
Admission: RE | Admit: 2018-12-13 | Discharge: 2018-12-13 | Disposition: A | Discharge status: Home / Self Care | Payer: Medicare HMO | Source: Ambulatory Visit | Attending: Radiation Oncology | Admitting: Radiation Oncology

## 2018-12-13 DIAGNOSIS — Z510 Encounter for antineoplastic radiation therapy: Secondary | ICD-10-CM | POA: Diagnosis not present

## 2018-12-13 DIAGNOSIS — C61 Malignant neoplasm of prostate: Secondary | ICD-10-CM | POA: Insufficient documentation

## 2018-12-14 ENCOUNTER — Ambulatory Visit
Admission: RE | Admit: 2018-12-14 | Discharge: 2018-12-14 | Disposition: A | Discharge status: Home / Self Care | Payer: Medicare HMO | Source: Ambulatory Visit | Attending: Radiation Oncology | Admitting: Radiation Oncology

## 2018-12-14 DIAGNOSIS — Z510 Encounter for antineoplastic radiation therapy: Secondary | ICD-10-CM | POA: Diagnosis not present

## 2018-12-15 ENCOUNTER — Ambulatory Visit
Admission: RE | Admit: 2018-12-15 | Discharge: 2018-12-15 | Disposition: A | Discharge status: Home / Self Care | Payer: Medicare HMO | Source: Ambulatory Visit | Attending: Radiation Oncology | Admitting: Radiation Oncology

## 2018-12-15 DIAGNOSIS — Z510 Encounter for antineoplastic radiation therapy: Secondary | ICD-10-CM | POA: Diagnosis not present

## 2018-12-16 ENCOUNTER — Ambulatory Visit
Admission: RE | Admit: 2018-12-16 | Discharge: 2018-12-16 | Disposition: A | Discharge status: Home / Self Care | Payer: Medicare HMO | Source: Ambulatory Visit | Attending: Radiation Oncology | Admitting: Radiation Oncology

## 2018-12-16 DIAGNOSIS — Z510 Encounter for antineoplastic radiation therapy: Secondary | ICD-10-CM | POA: Diagnosis not present

## 2018-12-17 ENCOUNTER — Ambulatory Visit
Admission: RE | Admit: 2018-12-17 | Discharge: 2018-12-17 | Disposition: A | Discharge status: Home / Self Care | Payer: Medicare HMO | Source: Ambulatory Visit | Attending: Radiation Oncology | Admitting: Radiation Oncology

## 2018-12-17 DIAGNOSIS — Z510 Encounter for antineoplastic radiation therapy: Secondary | ICD-10-CM | POA: Diagnosis not present

## 2018-12-20 ENCOUNTER — Ambulatory Visit
Admission: RE | Admit: 2018-12-20 | Discharge: 2018-12-20 | Disposition: A | Discharge status: Home / Self Care | Payer: Medicare HMO | Source: Ambulatory Visit | Attending: Radiation Oncology | Admitting: Radiation Oncology

## 2018-12-20 DIAGNOSIS — Z510 Encounter for antineoplastic radiation therapy: Secondary | ICD-10-CM | POA: Diagnosis not present

## 2018-12-21 ENCOUNTER — Ambulatory Visit
Admission: RE | Admit: 2018-12-21 | Discharge: 2018-12-21 | Disposition: A | Discharge status: Home / Self Care | Payer: Medicare HMO | Source: Ambulatory Visit | Attending: Radiation Oncology | Admitting: Radiation Oncology

## 2018-12-21 DIAGNOSIS — Z510 Encounter for antineoplastic radiation therapy: Secondary | ICD-10-CM | POA: Diagnosis not present

## 2018-12-22 ENCOUNTER — Ambulatory Visit
Admission: RE | Admit: 2018-12-22 | Discharge: 2018-12-22 | Disposition: A | Discharge status: Home / Self Care | Payer: Medicare HMO | Source: Ambulatory Visit | Attending: Radiation Oncology | Admitting: Radiation Oncology

## 2018-12-22 DIAGNOSIS — Z510 Encounter for antineoplastic radiation therapy: Secondary | ICD-10-CM | POA: Diagnosis not present

## 2018-12-23 ENCOUNTER — Inpatient Hospital Stay: Payer: Medicare HMO

## 2018-12-23 ENCOUNTER — Other Ambulatory Visit: Payer: Self-pay

## 2018-12-23 ENCOUNTER — Ambulatory Visit
Admission: RE | Admit: 2018-12-23 | Discharge: 2018-12-23 | Disposition: A | Discharge status: Home / Self Care | Payer: Medicare HMO | Source: Ambulatory Visit | Attending: Radiation Oncology | Admitting: Radiation Oncology

## 2018-12-23 DIAGNOSIS — Z510 Encounter for antineoplastic radiation therapy: Secondary | ICD-10-CM | POA: Diagnosis not present

## 2018-12-24 ENCOUNTER — Inpatient Hospital Stay: Payer: Medicare HMO | Attending: Radiation Oncology

## 2018-12-24 ENCOUNTER — Other Ambulatory Visit: Payer: Self-pay

## 2018-12-24 ENCOUNTER — Ambulatory Visit
Admission: RE | Admit: 2018-12-24 | Discharge: 2018-12-24 | Disposition: A | Discharge status: Home / Self Care | Payer: Medicare HMO | Source: Ambulatory Visit | Attending: Radiation Oncology | Admitting: Radiation Oncology

## 2018-12-24 DIAGNOSIS — Z510 Encounter for antineoplastic radiation therapy: Secondary | ICD-10-CM | POA: Diagnosis not present

## 2018-12-24 DIAGNOSIS — C61 Malignant neoplasm of prostate: Secondary | ICD-10-CM | POA: Insufficient documentation

## 2018-12-24 LAB — CBC
HCT: 43.4 % (ref 39.0–52.0)
Hemoglobin: 14.2 g/dL (ref 13.0–17.0)
MCH: 29.1 pg (ref 26.0–34.0)
MCHC: 32.7 g/dL (ref 30.0–36.0)
MCV: 88.9 fL (ref 80.0–100.0)
Platelets: 218 10*3/uL (ref 150–400)
RBC: 4.88 MIL/uL (ref 4.22–5.81)
RDW: 13.9 % (ref 11.5–15.5)
WBC: 7.4 10*3/uL (ref 4.0–10.5)
nRBC: 0 % (ref 0.0–0.2)

## 2018-12-27 ENCOUNTER — Ambulatory Visit
Admission: RE | Admit: 2018-12-27 | Discharge: 2018-12-27 | Disposition: A | Discharge status: Home / Self Care | Payer: Medicare HMO | Source: Ambulatory Visit | Attending: Radiation Oncology | Admitting: Radiation Oncology

## 2018-12-27 ENCOUNTER — Other Ambulatory Visit: Payer: Self-pay

## 2018-12-27 DIAGNOSIS — Z510 Encounter for antineoplastic radiation therapy: Secondary | ICD-10-CM | POA: Diagnosis not present

## 2018-12-28 ENCOUNTER — Ambulatory Visit
Admission: RE | Admit: 2018-12-28 | Discharge: 2018-12-28 | Disposition: A | Discharge status: Home / Self Care | Payer: Medicare HMO | Source: Ambulatory Visit | Attending: Radiation Oncology | Admitting: Radiation Oncology

## 2018-12-28 DIAGNOSIS — Z510 Encounter for antineoplastic radiation therapy: Secondary | ICD-10-CM | POA: Diagnosis not present

## 2018-12-29 ENCOUNTER — Other Ambulatory Visit: Payer: Self-pay

## 2018-12-29 ENCOUNTER — Ambulatory Visit
Admission: RE | Admit: 2018-12-29 | Discharge: 2018-12-29 | Disposition: A | Discharge status: Home / Self Care | Payer: Medicare HMO | Source: Ambulatory Visit | Attending: Radiation Oncology | Admitting: Radiation Oncology

## 2018-12-29 DIAGNOSIS — Z510 Encounter for antineoplastic radiation therapy: Secondary | ICD-10-CM | POA: Diagnosis not present

## 2018-12-30 ENCOUNTER — Ambulatory Visit
Admission: RE | Admit: 2018-12-30 | Discharge: 2018-12-30 | Disposition: A | Discharge status: Home / Self Care | Payer: Medicare HMO | Source: Ambulatory Visit | Attending: Radiation Oncology | Admitting: Radiation Oncology

## 2018-12-30 ENCOUNTER — Other Ambulatory Visit: Payer: Self-pay

## 2018-12-30 DIAGNOSIS — Z510 Encounter for antineoplastic radiation therapy: Secondary | ICD-10-CM | POA: Diagnosis not present

## 2018-12-31 ENCOUNTER — Other Ambulatory Visit: Payer: Self-pay

## 2018-12-31 ENCOUNTER — Ambulatory Visit
Admission: RE | Admit: 2018-12-31 | Discharge: 2018-12-31 | Disposition: A | Discharge status: Home / Self Care | Payer: Medicare HMO | Source: Ambulatory Visit | Attending: Radiation Oncology | Admitting: Radiation Oncology

## 2018-12-31 DIAGNOSIS — Z510 Encounter for antineoplastic radiation therapy: Secondary | ICD-10-CM | POA: Diagnosis not present

## 2019-01-02 ENCOUNTER — Other Ambulatory Visit: Payer: Self-pay

## 2019-01-03 ENCOUNTER — Other Ambulatory Visit: Payer: Self-pay

## 2019-01-03 ENCOUNTER — Ambulatory Visit
Admission: RE | Admit: 2019-01-03 | Discharge: 2019-01-03 | Disposition: A | Discharge status: Home / Self Care | Payer: Medicare HMO | Source: Ambulatory Visit | Attending: Radiation Oncology | Admitting: Radiation Oncology

## 2019-01-03 DIAGNOSIS — Z510 Encounter for antineoplastic radiation therapy: Secondary | ICD-10-CM | POA: Diagnosis not present

## 2019-01-04 ENCOUNTER — Ambulatory Visit
Admission: RE | Admit: 2019-01-04 | Discharge: 2019-01-04 | Disposition: A | Discharge status: Home / Self Care | Payer: Medicare HMO | Source: Ambulatory Visit | Attending: Radiation Oncology | Admitting: Radiation Oncology

## 2019-01-04 ENCOUNTER — Other Ambulatory Visit: Payer: Self-pay

## 2019-01-04 DIAGNOSIS — Z510 Encounter for antineoplastic radiation therapy: Secondary | ICD-10-CM | POA: Diagnosis not present

## 2019-01-05 ENCOUNTER — Ambulatory Visit
Admission: RE | Admit: 2019-01-05 | Discharge: 2019-01-05 | Disposition: A | Discharge status: Home / Self Care | Payer: Medicare HMO | Source: Ambulatory Visit | Attending: Radiation Oncology | Admitting: Radiation Oncology

## 2019-01-05 ENCOUNTER — Other Ambulatory Visit: Payer: Self-pay

## 2019-01-05 DIAGNOSIS — Z510 Encounter for antineoplastic radiation therapy: Secondary | ICD-10-CM | POA: Diagnosis not present

## 2019-01-06 ENCOUNTER — Inpatient Hospital Stay: Payer: Medicare HMO

## 2019-01-06 ENCOUNTER — Ambulatory Visit
Admission: RE | Admit: 2019-01-06 | Discharge: 2019-01-06 | Disposition: A | Discharge status: Home / Self Care | Payer: Medicare HMO | Source: Ambulatory Visit | Attending: Radiation Oncology | Admitting: Radiation Oncology

## 2019-01-06 ENCOUNTER — Other Ambulatory Visit: Payer: Self-pay

## 2019-01-06 DIAGNOSIS — C61 Malignant neoplasm of prostate: Secondary | ICD-10-CM | POA: Diagnosis not present

## 2019-01-06 DIAGNOSIS — Z510 Encounter for antineoplastic radiation therapy: Secondary | ICD-10-CM | POA: Diagnosis not present

## 2019-01-06 LAB — CBC
HCT: 43.1 % (ref 39.0–52.0)
Hemoglobin: 14 g/dL (ref 13.0–17.0)
MCH: 29.3 pg (ref 26.0–34.0)
MCHC: 32.5 g/dL (ref 30.0–36.0)
MCV: 90.2 fL (ref 80.0–100.0)
Platelets: 207 10*3/uL (ref 150–400)
RBC: 4.78 MIL/uL (ref 4.22–5.81)
RDW: 13.5 % (ref 11.5–15.5)
WBC: 5.9 10*3/uL (ref 4.0–10.5)
nRBC: 0 % (ref 0.0–0.2)

## 2019-01-07 ENCOUNTER — Other Ambulatory Visit: Payer: Self-pay

## 2019-01-07 ENCOUNTER — Ambulatory Visit
Admission: RE | Admit: 2019-01-07 | Discharge: 2019-01-07 | Disposition: A | Discharge status: Home / Self Care | Payer: Medicare HMO | Source: Ambulatory Visit | Attending: Radiation Oncology | Admitting: Radiation Oncology

## 2019-01-07 DIAGNOSIS — Z510 Encounter for antineoplastic radiation therapy: Secondary | ICD-10-CM | POA: Diagnosis not present

## 2019-01-10 ENCOUNTER — Other Ambulatory Visit: Payer: Self-pay

## 2019-01-10 ENCOUNTER — Ambulatory Visit
Admission: RE | Admit: 2019-01-10 | Discharge: 2019-01-10 | Disposition: A | Discharge status: Home / Self Care | Payer: Medicare HMO | Source: Ambulatory Visit | Attending: Radiation Oncology | Admitting: Radiation Oncology

## 2019-01-10 DIAGNOSIS — Z510 Encounter for antineoplastic radiation therapy: Secondary | ICD-10-CM | POA: Diagnosis not present

## 2019-01-11 ENCOUNTER — Other Ambulatory Visit: Payer: Self-pay

## 2019-01-11 ENCOUNTER — Ambulatory Visit
Admission: RE | Admit: 2019-01-11 | Discharge: 2019-01-11 | Disposition: A | Discharge status: Home / Self Care | Payer: Medicare HMO | Source: Ambulatory Visit | Attending: Radiation Oncology | Admitting: Radiation Oncology

## 2019-01-11 DIAGNOSIS — Z510 Encounter for antineoplastic radiation therapy: Secondary | ICD-10-CM | POA: Diagnosis not present

## 2019-01-12 ENCOUNTER — Ambulatory Visit
Admission: RE | Admit: 2019-01-12 | Discharge: 2019-01-12 | Disposition: A | Discharge status: Home / Self Care | Payer: Medicare HMO | Source: Ambulatory Visit | Attending: Radiation Oncology | Admitting: Radiation Oncology

## 2019-01-12 ENCOUNTER — Other Ambulatory Visit: Payer: Self-pay

## 2019-01-12 DIAGNOSIS — C61 Malignant neoplasm of prostate: Secondary | ICD-10-CM | POA: Insufficient documentation

## 2019-01-12 DIAGNOSIS — Z510 Encounter for antineoplastic radiation therapy: Secondary | ICD-10-CM | POA: Insufficient documentation

## 2019-01-13 ENCOUNTER — Other Ambulatory Visit: Payer: Self-pay

## 2019-01-13 ENCOUNTER — Ambulatory Visit
Admission: RE | Admit: 2019-01-13 | Discharge: 2019-01-13 | Disposition: A | Discharge status: Home / Self Care | Payer: Medicare HMO | Source: Ambulatory Visit | Attending: Radiation Oncology | Admitting: Radiation Oncology

## 2019-01-13 DIAGNOSIS — Z510 Encounter for antineoplastic radiation therapy: Secondary | ICD-10-CM | POA: Diagnosis not present

## 2019-01-14 ENCOUNTER — Other Ambulatory Visit: Payer: Self-pay

## 2019-01-14 ENCOUNTER — Ambulatory Visit
Admission: RE | Admit: 2019-01-14 | Discharge: 2019-01-14 | Disposition: A | Discharge status: Home / Self Care | Payer: Medicare HMO | Source: Ambulatory Visit | Attending: Radiation Oncology | Admitting: Radiation Oncology

## 2019-01-14 DIAGNOSIS — Z510 Encounter for antineoplastic radiation therapy: Secondary | ICD-10-CM | POA: Diagnosis not present

## 2019-01-17 ENCOUNTER — Ambulatory Visit
Admission: RE | Admit: 2019-01-17 | Discharge: 2019-01-17 | Disposition: A | Discharge status: Home / Self Care | Payer: Medicare HMO | Source: Ambulatory Visit | Attending: Radiation Oncology | Admitting: Radiation Oncology

## 2019-01-17 ENCOUNTER — Other Ambulatory Visit: Payer: Self-pay

## 2019-01-17 DIAGNOSIS — Z510 Encounter for antineoplastic radiation therapy: Secondary | ICD-10-CM | POA: Diagnosis not present

## 2019-01-18 ENCOUNTER — Other Ambulatory Visit: Payer: Self-pay

## 2019-01-18 ENCOUNTER — Ambulatory Visit
Admission: RE | Admit: 2019-01-18 | Discharge: 2019-01-18 | Disposition: A | Discharge status: Home / Self Care | Payer: Medicare HMO | Source: Ambulatory Visit | Attending: Radiation Oncology | Admitting: Radiation Oncology

## 2019-01-18 DIAGNOSIS — Z510 Encounter for antineoplastic radiation therapy: Secondary | ICD-10-CM | POA: Diagnosis not present

## 2019-01-19 ENCOUNTER — Other Ambulatory Visit: Payer: Self-pay

## 2019-01-19 ENCOUNTER — Ambulatory Visit
Admission: RE | Admit: 2019-01-19 | Discharge: 2019-01-19 | Disposition: A | Discharge status: Home / Self Care | Payer: Medicare HMO | Source: Ambulatory Visit | Attending: Radiation Oncology | Admitting: Radiation Oncology

## 2019-01-19 DIAGNOSIS — Z510 Encounter for antineoplastic radiation therapy: Secondary | ICD-10-CM | POA: Diagnosis not present

## 2019-01-20 ENCOUNTER — Other Ambulatory Visit: Payer: Self-pay

## 2019-01-20 ENCOUNTER — Ambulatory Visit
Admission: RE | Admit: 2019-01-20 | Discharge: 2019-01-20 | Disposition: A | Discharge status: Home / Self Care | Payer: Medicare HMO | Source: Ambulatory Visit | Attending: Radiation Oncology | Admitting: Radiation Oncology

## 2019-01-20 ENCOUNTER — Inpatient Hospital Stay: Payer: Medicare HMO | Attending: Radiation Oncology

## 2019-01-20 DIAGNOSIS — Z510 Encounter for antineoplastic radiation therapy: Secondary | ICD-10-CM | POA: Diagnosis not present

## 2019-01-20 DIAGNOSIS — C61 Malignant neoplasm of prostate: Secondary | ICD-10-CM | POA: Insufficient documentation

## 2019-01-20 LAB — CBC
HCT: 43.1 % (ref 39.0–52.0)
Hemoglobin: 14.1 g/dL (ref 13.0–17.0)
MCH: 29.6 pg (ref 26.0–34.0)
MCHC: 32.7 g/dL (ref 30.0–36.0)
MCV: 90.4 fL (ref 80.0–100.0)
Platelets: 195 10*3/uL (ref 150–400)
RBC: 4.77 MIL/uL (ref 4.22–5.81)
RDW: 13.5 % (ref 11.5–15.5)
WBC: 5 10*3/uL (ref 4.0–10.5)
nRBC: 0 % (ref 0.0–0.2)

## 2019-01-21 ENCOUNTER — Other Ambulatory Visit: Payer: Self-pay

## 2019-01-21 ENCOUNTER — Ambulatory Visit
Admission: RE | Admit: 2019-01-21 | Discharge: 2019-01-21 | Disposition: A | Discharge status: Home / Self Care | Payer: Medicare HMO | Source: Ambulatory Visit | Attending: Radiation Oncology | Admitting: Radiation Oncology

## 2019-01-21 DIAGNOSIS — Z510 Encounter for antineoplastic radiation therapy: Secondary | ICD-10-CM | POA: Diagnosis not present

## 2019-01-24 ENCOUNTER — Other Ambulatory Visit: Payer: Self-pay

## 2019-01-24 ENCOUNTER — Ambulatory Visit
Admission: RE | Admit: 2019-01-24 | Discharge: 2019-01-24 | Disposition: A | Discharge status: Home / Self Care | Payer: Medicare HMO | Source: Ambulatory Visit | Attending: Radiation Oncology | Admitting: Radiation Oncology

## 2019-01-24 DIAGNOSIS — Z510 Encounter for antineoplastic radiation therapy: Secondary | ICD-10-CM | POA: Diagnosis not present

## 2019-01-25 ENCOUNTER — Ambulatory Visit
Admission: RE | Admit: 2019-01-25 | Discharge: 2019-01-25 | Disposition: A | Discharge status: Home / Self Care | Payer: Medicare HMO | Source: Ambulatory Visit | Attending: Radiation Oncology | Admitting: Radiation Oncology

## 2019-01-25 ENCOUNTER — Other Ambulatory Visit: Payer: Self-pay

## 2019-01-25 DIAGNOSIS — Z510 Encounter for antineoplastic radiation therapy: Secondary | ICD-10-CM | POA: Diagnosis not present

## 2019-01-26 ENCOUNTER — Ambulatory Visit
Admission: RE | Admit: 2019-01-26 | Discharge: 2019-01-26 | Disposition: A | Discharge status: Home / Self Care | Payer: Medicare HMO | Source: Ambulatory Visit | Attending: Radiation Oncology | Admitting: Radiation Oncology

## 2019-01-26 ENCOUNTER — Other Ambulatory Visit: Payer: Self-pay

## 2019-01-26 DIAGNOSIS — Z510 Encounter for antineoplastic radiation therapy: Secondary | ICD-10-CM | POA: Diagnosis not present

## 2019-01-27 ENCOUNTER — Ambulatory Visit
Admission: RE | Admit: 2019-01-27 | Discharge: 2019-01-27 | Disposition: A | Discharge status: Home / Self Care | Payer: Medicare HMO | Source: Ambulatory Visit | Attending: Radiation Oncology | Admitting: Radiation Oncology

## 2019-01-27 ENCOUNTER — Other Ambulatory Visit: Payer: Self-pay

## 2019-01-27 DIAGNOSIS — Z510 Encounter for antineoplastic radiation therapy: Secondary | ICD-10-CM | POA: Diagnosis not present

## 2019-01-28 ENCOUNTER — Ambulatory Visit
Admission: RE | Admit: 2019-01-28 | Discharge: 2019-01-28 | Disposition: A | Discharge status: Home / Self Care | Payer: Medicare HMO | Source: Ambulatory Visit | Attending: Radiation Oncology | Admitting: Radiation Oncology

## 2019-01-28 ENCOUNTER — Other Ambulatory Visit: Payer: Self-pay

## 2019-01-28 DIAGNOSIS — Z510 Encounter for antineoplastic radiation therapy: Secondary | ICD-10-CM | POA: Diagnosis not present

## 2019-01-31 ENCOUNTER — Other Ambulatory Visit: Payer: Self-pay

## 2019-01-31 ENCOUNTER — Ambulatory Visit
Admission: RE | Admit: 2019-01-31 | Discharge: 2019-01-31 | Disposition: A | Discharge status: Home / Self Care | Payer: Medicare HMO | Source: Ambulatory Visit | Attending: Radiation Oncology | Admitting: Radiation Oncology

## 2019-01-31 DIAGNOSIS — Z510 Encounter for antineoplastic radiation therapy: Secondary | ICD-10-CM | POA: Diagnosis not present

## 2019-02-01 ENCOUNTER — Ambulatory Visit
Admission: RE | Admit: 2019-02-01 | Discharge: 2019-02-01 | Disposition: A | Discharge status: Home / Self Care | Payer: Medicare HMO | Source: Ambulatory Visit | Attending: Radiation Oncology | Admitting: Radiation Oncology

## 2019-02-01 ENCOUNTER — Other Ambulatory Visit: Payer: Self-pay

## 2019-02-01 DIAGNOSIS — Z510 Encounter for antineoplastic radiation therapy: Secondary | ICD-10-CM | POA: Diagnosis not present

## 2019-02-02 ENCOUNTER — Ambulatory Visit
Admission: RE | Admit: 2019-02-02 | Discharge: 2019-02-02 | Disposition: A | Discharge status: Home / Self Care | Payer: Medicare HMO | Source: Ambulatory Visit | Attending: Radiation Oncology | Admitting: Radiation Oncology

## 2019-02-02 ENCOUNTER — Other Ambulatory Visit: Payer: Self-pay

## 2019-02-02 DIAGNOSIS — Z510 Encounter for antineoplastic radiation therapy: Secondary | ICD-10-CM | POA: Diagnosis not present

## 2019-03-16 ENCOUNTER — Ambulatory Visit
Admission: RE | Admit: 2019-03-16 | Discharge: 2019-03-16 | Disposition: A | Discharge status: Home / Self Care | Payer: Medicare HMO | Source: Ambulatory Visit | Attending: Radiation Oncology | Admitting: Radiation Oncology

## 2019-03-16 ENCOUNTER — Encounter: Payer: Self-pay | Admitting: Radiation Oncology

## 2019-03-16 ENCOUNTER — Other Ambulatory Visit: Payer: Self-pay

## 2019-03-16 ENCOUNTER — Other Ambulatory Visit: Payer: Self-pay | Admitting: *Deleted

## 2019-03-16 VITALS — BP 128/79 | HR 75 | Temp 98.0°F | Resp 16 | Wt 181.1 lb

## 2019-03-16 DIAGNOSIS — C61 Malignant neoplasm of prostate: Secondary | ICD-10-CM

## 2019-03-16 DIAGNOSIS — Z923 Personal history of irradiation: Secondary | ICD-10-CM | POA: Diagnosis not present

## 2019-03-16 NOTE — Progress Notes (Signed)
Radiation Oncology Follow up Note  Name: Andre Jackson   Date:   03/16/2019 MRN:  [id number] DOB: 1946-04-14    This 73 y.o. 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  REFERRING PROVIDER: Baxter Hire, MD  HPI: Patient is a 73 year old male now at 1 month having completed IMRT radiation therapy to his prostate for stage IIa (T1 cN0 M0) Gleason 7 (3+4) presenting with a PSA of 4.7.  He is seen today in routine follow-up and is doing well.  He specifically denies diarrhea dysuria or any other GI/GU complaints..  COMPLICATIONS OF TREATMENT: none  FOLLOW UP COMPLIANCE: keeps appointments   PHYSICAL EXAM:  BP 128/79 (BP Location: Left Arm, Patient Position: Sitting)   Pulse 75   Temp 98 F (36.7 C) (Tympanic)   Resp 16   Wt 181 lb 1.7 oz (82.2 kg)   BMI 27.54 kg/m  Well-developed well-nourished patient in NAD. HEENT reveals PERLA, EOMI, discs not visualized.  Oral cavity is clear. No oral mucosal lesions are identified. Neck is clear without evidence of cervical or supraclavicular adenopathy. Lungs are clear to A&P. Cardiac examination is essentially unremarkable with regular rate and rhythm without murmur rub or thrill. Abdomen is benign with no organomegaly or masses noted. Motor sensory and DTR levels are equal and symmetric in the upper and lower extremities. Cranial nerves II through XII are grossly intact. Proprioception is intact. No peripheral adenopathy or edema is identified. No motor or sensory levels are noted. Crude visual fields are within normal range.  RADIOLOGY RESULTS: No current films for review  PLAN: Present time patient continues to do well with very low side effect profile.  I have explained to him our PSA follow-up protocol have asked to see him back in 3 months for follow-up with a PSA prior to that visit.  Patient knows to call  sooner with any concerns.  I would like to take this opportunity to thank you for allowing me to participate in the care of your patient.Noreene Filbert, MD

## 2019-04-13 DIAGNOSIS — M1611 Unilateral primary osteoarthritis, right hip: Secondary | ICD-10-CM | POA: Diagnosis present

## 2019-04-13 NOTE — H&P (Addendum)
HIP ARTHROPLASTY ADMISSION H&P  Patient ID: Andre Jackson MRN: [id number] DOB/AGE: 1946-05-23 73 y.o.  Chief Complaint: right hip pain.  Planned Procedure Date: 05/10/2019 Medical Clearance by Dr. Edwina Barth Additional clearance by oncology: Dr. Eulas Post  HPI: Andre Jackson is a 73 y.o. male with a history of hypertension, hyperlipidemia, and prostate cancer status post radiation therapy who presents for evaluation of OA RIGHT HIP. The patient has a history of pain and functional disability in the right hip due to arthritis and has failed non-surgical conservative treatments for greater than 12 weeks to include NSAID's and/or analgesics, corticosteriod injections and activity modification.  Onset of symptoms was gradual, starting 1 years ago with gradually worsening course since that time.  Patient currently rates pain at 7 out of 10 with activity. Patient has night pain and pain that interferes with activities of daily living.  Patient has evidence of periarticular osteophytes and joint space narrowing by imaging studies.  There is no active infection.  Past Medical History:  Diagnosis Date  . Arthritis    "knees, hips" (04/03/2015)  . Cataract immature   . Diverticulosis   . Hx of colonic polyps   . Hyperlipidemia    takes Lipitor daily  . Hypertension    takes Prinizide daily  . Joint pain   . Joint swelling   . Left knee DJD   . Seasonal allergies    takes Allegra daily   Past Surgical History:  Procedure Laterality Date  . BACK SURGERY    . COLONOSCOPY W/ BIOPSIES AND POLYPECTOMY  07/2011   "have had it once before that also"  . I&D EXTREMITY Left ~ 2010   knee  . JOINT REPLACEMENT    . KNEE ARTHROSCOPY Left ~ 2011  . LIPOMA EXCISION  07/2011   neck  . LUMBAR DISC SURGERY  1970's  . MULTIPLE TOOTH EXTRACTIONS  ~ 2007  . 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 REVERSE SHOULDER ARTHROPLASTY Left 12/02/2017   Procedure: REVERSE SHOULDER ARTHROPLASTY;  Surgeon: Hiram Gash, MD;  Location: [place];  Service: Orthopedics;  Laterality: Left;  . TONSILLECTOMY AND ADENOIDECTOMY  1940's  . TOTAL HIP ARTHROPLASTY Left 04/03/2015  . TOTAL HIP ARTHROPLASTY Left 04/03/2015   Procedure: TOTAL HIP ARTHROPLASTY ANTERIOR APPROACH;  Surgeon: Renette Butters, MD;  Location: [place];  Service: Orthopedics;  Laterality: Left;  . TOTAL KNEE ARTHROPLASTY  04/05/2012   Procedure: TOTAL KNEE ARTHROPLASTY;  Surgeon: Lorn Junes, MD;  Location: [place];  Service: Orthopedics;  Laterality: Right;  DR [place] THIS CASE  . TOTAL KNEE ARTHROPLASTY  08/02/2012   Procedure: TOTAL KNEE ARTHROPLASTY;  Surgeon: Lorn Junes, MD;  Location: [place];  Service: Orthopedics;  Laterality: Left;   No Known Allergies Prior to Admission medications   Medication Sig Start Date End Date Taking? Authorizing Provider  aspirin EC 81 MG tablet Take 81 mg by mouth daily.    [provider]  atorvastatin (LIPITOR) 10 MG tablet Take 10 mg by mouth daily.    [provider]  fluticasone (FLONASE) 50 MCG/ACT nasal spray Place into the nose. 10/01/18 10/01/19  [provider]  lisinopril-hydrochlorothiazide (PRINZIDE,ZESTORETIC) 20-25 MG per tablet Take 1 tablet by mouth daily.    [provider]  meloxicam (MOBIC) 15 MG tablet Take 15 mg by mouth daily. 09/04/18   [provider]  naproxen sodium (ALEVE) 220 MG tablet Take 220 mg by mouth daily as needed (for  pain or headache).    [provider]  sildenafil (REVATIO) 20 MG tablet Take 2-5 tablets (40-100 mg total) by mouth daily as needed. 06/09/17   Ardis Hughs, MD  sildenafil (REVATIO) 20 MG tablet Take 1-5 tablets (20-100 mg total) by mouth as needed. 11/02/18   Billey Co, MD   Social History   Socioeconomic History  . Marital status: Married    Spouse name: Not on file  . Number of children: Not on file  . Years of education: Not on file  . Highest education  level: Not on file  Occupational History  . Not on file  Social Needs  . Financial resource strain: Not on file  . Food insecurity    Worry: Not on file    Inability: Not on file  . Transportation needs    Medical: Not on file    Non-medical: Not on file  Tobacco Use  . Smoking status: Former Smoker    Packs/day: 0.33    Years: 44.00    Pack years: 14.52    Types: Cigarettes    Quit date: 02/10/2009    Years since quitting: 10.1  . Smokeless tobacco: Never Used  Substance and Sexual Activity  . Alcohol use: No    Frequency: Never  . Drug use: No  . Sexual activity: Yes  Lifestyle  . Physical activity    Days per week: Not on file    Minutes per session: Not on file  . Stress: Not on file  Relationships  . Social Herbalist on phone: Not on file    Gets together: Not on file    Attends religious service: Not on file    Active member of club or organization: Not on file    Attends meetings of clubs or organizations: Not on file    Relationship status: Not on file  Other Topics Concern  . Not on file  Social History Narrative  . Not on file   Family History  Problem Relation Age of Onset  . Cerebral aneurysm Mother   . Hypertension Mother   . Hypertension Sister   . Hypertension Brother   . Prostate cancer Neg Hx     ROS: Currently denies lightheadedness, dizziness, Fever, chills, CP, SOB.   No personal history of DVT, PE, MI, or CVA. No loose teeth.  He has dentures and several native teeth. All other systems have been reviewed and were otherwise currently negative with the exception of those mentioned in the HPI and as above.  Objective: Vitals: Ht: 5 feet 8 inches wt: 180 LB temp: 97.9 BP: 142/88 pulse: 93 O2 96 % on room air.   Physical Exam: General: Alert, NAD. Trendelenberg Gait  HEENT: EOMI, Good Neck Extension  Pulm: No increased work of breathing.  Clear B/L A/P w/o crackle or wheeze.  CV: RRR, No m/g/r appreciated  GI: soft, NT,  ND Neuro: Neuro without gross focal deficit.  Sensation intact distally Skin: No lesions in the area of chief complaint MSK/Surgical Site: Right hip pain with passive ROM.  Positive Stinchfield.  5/5 strength.  NVI.  Sensation intact distally.  Imaging Review Plain radiographs demonstrate severe degenerative joint disease of the right hip.   Preoperative templating of the joint replacement has been completed, documented, and submitted to the Operating Room personnel in order to optimize intra-operative equipment management.  Assessment: OA RIGHT HIP Principal Problem:   Primary osteoarthritis of right hip Active Problems:  Hypertension   Hyperlipidemia   DJD (degenerative joint disease)   Prostate cancer ([place])   Plan: Plan for Procedure(s): TOTAL HIP ARTHROPLASTY ANTERIOR APPROACH  The patient history, physical exam, clinical judgement of the provider and imaging are consistent with end stage degenerative joint disease and total joint arthroplasty is deemed medically necessary. The treatment options including medical management, injection therapy, and arthroplasty were discussed at length. The risks and benefits of Procedure(s): TOTAL HIP ARTHROPLASTY ANTERIOR APPROACH were presented and reviewed.  The risks of nonoperative treatment, versus surgical intervention including but not limited to continued pain, aseptic loosening, stiffness, dislocation/subluxation, infection, bleeding, nerve injury, blood clots, cardiopulmonary complications, morbidity, mortality, among others were discussed. The patient verbalizes understanding and wishes to proceed with the plan.  Patient is being admitted for surgery, pain control, PT, prophylactic antibiotics, VTE prophylaxis, progressive ambulation, ADL's and discharge planning.   Dental prophylaxis discussed and recommended for 2 years postoperatively.   The patient does meet the criteria for TXA which will be used perioperatively.    ASA 81 mg  BID will be used postoperatively for DVT prophylaxis in addition to SCDs, and early ambulation.  Plan for small Rx for Norco.  Oxycodone disrupts his appetite.  He may continue meloxicam but will make Rx for omeprazole for gastric protection. 100 mg Gabapentin.  Baclofen for spasm.  He did well with minimal medications after previous hip surgery.  The patient is planning to be discharged home with HHPT ([facility]) in care of his wife Inez Catalina.  Anticipated LOS less than 2 midnights.  Insurance approval for inpatient. - Age 86 and older with one or more of the following:  - Expected need for hospital services (PT, OT, Nursing) required for safe  discharge    Prudencio Burly III, PA-C 04/13/2019 2:08 PM

## 2019-04-28 ENCOUNTER — Other Ambulatory Visit: Payer: Self-pay

## 2019-04-28 NOTE — Progress Notes (Signed)
ERROR

## 2019-04-29 ENCOUNTER — Other Ambulatory Visit: Payer: Medicare HMO

## 2019-04-29 ENCOUNTER — Other Ambulatory Visit: Payer: Self-pay

## 2019-04-29 DIAGNOSIS — C61 Malignant neoplasm of prostate: Secondary | ICD-10-CM

## 2019-04-30 LAB — PSA: Prostate Specific Ag, Serum: 1.9 ng/mL (ref 0.0–4.0)

## 2019-05-02 ENCOUNTER — Encounter (HOSPITAL_COMMUNITY): Payer: Self-pay

## 2019-05-02 ENCOUNTER — Ambulatory Visit: Payer: Medicare HMO | Admitting: Urology

## 2019-05-02 ENCOUNTER — Encounter: Payer: Self-pay | Admitting: *Deleted

## 2019-05-02 NOTE — Progress Notes (Signed)
Medical clearance Dr. Wynetta Emery 04/05/2019 in care everywhere.

## 2019-05-02 NOTE — Patient Instructions (Addendum)
DUE TO COVID-19 ONLY ONE VISITOR IS ALLOWED IN THE HOSPITAL AT THIS TIME   COVID SWAB TESTING MUST BE COMPLETED ON:  May 06, 2019 at              (Must self quarantine after testing. Follow instructions on handout.)   Your procedure is scheduled on: Tuesday, May 06, 2019   Surgery Time:  12:53PM-3:15PM   Report to [facility]  Entrance Report to admitting at 10:15 AM   Call this number if you have problems the morning of surgery [phone]   Do not eat food:After Midnight.   May have liquids until 9:45AM day of surgery   CLEAR LIQUID DIET  Foods Allowed                                                                     Foods Excluded  Water, Black Coffee and tea, regular and decaf                             liquids that you cannot  Plain Jell-O in any flavor                                             see through such as: Fruit ices (not with fruit pulp)                                     milk, soups, orange juice  Iced Popsicles                                    All solid food Carbonated beverages, regular and diet                                    Cranberry, grape and apple juices Sports drinks like Gatorade Lightly seasoned clear broth or consume(fat free) Sugar, honey syrup  Sample Menu Breakfast                                Lunch                                     Supper Cranberry juice                    Beef broth                            Chicken broth Jell-O                                     Grape juice  Apple juice Coffee or tea                        Jell-O                                      Popsicle                                                Coffee or tea                        Coffee or tea   Complete one Ensure drink the morning of surgery at   9:45AM the day of surgery.   Brush your teeth the morning of surgery.   Do NOT smoke after Midnight   Take these medicines the morning of surgery with A SIP  OF WATER: Atorvastatin, Cetirizine if needed   May use Flonase if needed                               You may not have any metal on your body including  jewelry, and body piercings             Do not wear  lotions, powders, perfumes/cologne, or deodorant                          Men may shave face and neck.   Do not bring valuables to the hospital. [place].   Contacts, dentures or bridgework may not be worn into surgery.   Bring small overnight bag day of surgery.    Special Instructions: Bring a copy of your healthcare power of attorney and living will documents         the day of surgery if you haven't scanned them in before.              Please read over the following fact sheets you were given:  [facility] - Preparing for Surgery Before surgery, you can play an important role.   Because skin is not sterile, your skin needs to be as free of germs as possible .  You can reduce the number of germs on your skin by washing with CHG (chlorahexidine gluconate) soap before surgery.   CHG is an antiseptic cleaner which kills germs and bonds with the skin to continue killing germs even after washing. Please DO NOT use if you have an allergy to CHG or antibacterial soaps .  If your skin becomes reddened/irritated stop using the CHG and inform your nurse when you arrive at Short Stay.  You may shave your face/neck.  Please follow these instructions carefully:  1.  Shower with CHG Soap the night before surgery and the  morning of surgery.  2.  If you choose to wash your hair, wash your hair first as usual with your normal  shampoo.  3.  After you shampoo, rinse your hair and body thoroughly to remove the shampoo.  4.  Use CHG as you would any other liquid soap.  You can apply chg directly to the skin and wash.  Gently with a scrungie or clean washcloth.  5.  Apply the CHG Soap to your body ONLY FROM THE NECK  DOWN.   Do   not use on face/ open                           Wound or open sores. Avoid contact with eyes, ears mouth and   genitals (private parts).                       Wash face,  Genitals (private parts) with your normal soap.             6.  Wash thoroughly, paying special attention to the area where your    surgery  will be performed.  7.  Thoroughly rinse your body with warm water from the neck down.  8.  DO NOT shower/wash with your normal soap after using and rinsing off the CHG Soap.                9.  Pat yourself dry with a clean towel.            10.  Wear clean pajamas.            11.  Place clean sheets on your bed the night of your first shower and do not  sleep with pets. Day of Surgery : Do not apply any lotions/deodorants the morning of surgery.  Please wear clean clothes to the hospital/surgery center.  FAILURE TO FOLLOW THESE INSTRUCTIONS MAY RESULT IN THE CANCELLATION OF YOUR SURGERY  PATIENT SIGNATURE_________________________________  NURSE SIGNATURE__________________________________  ________________________________________________________________________   Andre Jackson  An incentive spirometer is a tool that can help keep your lungs clear and active. This tool measures how well you are filling your lungs with each breath. Taking long deep breaths may help reverse or decrease the chance of developing breathing (pulmonary) problems (especially infection) following:  A long period of time when you are unable to move or be active. BEFORE THE PROCEDURE   If the spirometer includes an indicator to show your best effort, your nurse or respiratory therapist will set it to a desired goal.  If possible, sit up straight or lean slightly forward. Try not to slouch.  Hold the incentive spirometer in an upright position. INSTRUCTIONS FOR USE  1. Sit on the edge of your bed if possible, or sit up as far as you can in bed or on a chair. 2. Hold the incentive  spirometer in an upright position. 3. Breathe out normally. 4. Place the mouthpiece in your mouth and seal your lips tightly around it. 5. Breathe in slowly and as deeply as possible, raising the piston or the ball toward the top of the column. 6. Hold your breath for 3-5 seconds or for as long as possible. Allow the piston or ball to fall to the bottom of the column. 7. Remove the mouthpiece from your mouth and breathe out normally. 8. Rest for a few seconds and repeat Steps 1 through 7 at least 10 times every 1-2 hours when you are awake. Take your time and take a few normal breaths between deep breaths. 9. The spirometer may include an indicator to show your best effort. Use the indicator as a goal to work toward during each  repetition. 10. After each set of 10 deep breaths, practice coughing to be sure your lungs are clear. If you have an incision (the cut made at the time of surgery), support your incision when coughing by placing a pillow or rolled up towels firmly against it. Once you are able to get out of bed, walk around indoors and cough well. You may stop using the incentive spirometer when instructed by your caregiver.  RISKS AND COMPLICATIONS  Take your time so you do not get dizzy or light-headed.  If you are in pain, you may need to take or ask for pain medication before doing incentive spirometry. It is harder to take a deep breath if you are having pain. AFTER USE  Rest and breathe slowly and easily.  It can be helpful to keep track of a log of your progress. Your caregiver can provide you with a simple table to help with this. If you are using the spirometer at home, follow these instructions: [place] IF:   You are having difficultly using the spirometer.  You have trouble using the spirometer as often as instructed.  Your pain medication is not giving enough relief while using the spirometer.  You develop fever of 100.5 F (38.1 C) or higher. SEEK  IMMEDIATE MEDICAL CARE IF:   You cough up bloody sputum that had not been present before.  You develop fever of 102 F (38.9 C) or greater.  You develop worsening pain at or near the incision site. MAKE SURE YOU:   Understand these instructions.  Will watch your condition.  Will get help right away if you are not doing well or get worse. Document Released: 02/09/2007 Document Revised: 12/22/2011 Document Reviewed: 04/12/2007 [facility] Patient Information 2014 [place], [place].   ________________________________________________________________________

## 2019-05-03 ENCOUNTER — Encounter (HOSPITAL_COMMUNITY)
Admission: RE | Admit: 2019-05-03 | Discharge: 2019-05-03 | Disposition: A | Discharge status: Home / Self Care | Payer: Medicare HMO | Source: Ambulatory Visit | Attending: Orthopedic Surgery | Admitting: Orthopedic Surgery

## 2019-05-03 ENCOUNTER — Other Ambulatory Visit: Payer: Self-pay

## 2019-05-03 ENCOUNTER — Encounter (HOSPITAL_COMMUNITY): Payer: Self-pay

## 2019-05-03 DIAGNOSIS — Z01818 Encounter for other preprocedural examination: Secondary | ICD-10-CM | POA: Diagnosis present

## 2019-05-03 DIAGNOSIS — Z20828 Contact with and (suspected) exposure to other viral communicable diseases: Secondary | ICD-10-CM | POA: Insufficient documentation

## 2019-05-03 HISTORY — DX: Unspecified osteoarthritis, unspecified site: M19.90

## 2019-05-03 HISTORY — DX: Male erectile dysfunction, unspecified: N52.9

## 2019-05-03 HISTORY — DX: Malignant neoplasm of prostate: C61

## 2019-05-03 LAB — CBC
HCT: 44.1 % (ref 39.0–52.0)
Hemoglobin: 14 g/dL (ref 13.0–17.0)
MCH: 29.7 pg (ref 26.0–34.0)
MCHC: 31.7 g/dL (ref 30.0–36.0)
MCV: 93.4 fL (ref 80.0–100.0)
Platelets: 243 10*3/uL (ref 150–400)
RBC: 4.72 MIL/uL (ref 4.22–5.81)
RDW: 13.4 % (ref 11.5–15.5)
WBC: 6.2 10*3/uL (ref 4.0–10.5)
nRBC: 0 % (ref 0.0–0.2)

## 2019-05-03 LAB — BASIC METABOLIC PANEL
Anion gap: 9 (ref 5–15)
BUN: 29 mg/dL — ABNORMAL HIGH (ref 8–23)
CO2: 27 mmol/L (ref 22–32)
Calcium: 9.4 mg/dL (ref 8.9–10.3)
Chloride: 102 mmol/L (ref 98–111)
Creatinine, Ser: 1.41 mg/dL — ABNORMAL HIGH (ref 0.61–1.24)
GFR calc Af Amer: 56 mL/min — ABNORMAL LOW (ref >60)
GFR calc non Af Amer: 48 mL/min — ABNORMAL LOW (ref >60)
Glucose, Bld: 120 mg/dL — ABNORMAL HIGH (ref 70–99)
Potassium: 4 mmol/L (ref 3.5–5.1)
Sodium: 138 mmol/L (ref 135–145)

## 2019-05-03 LAB — SURGICAL PCR SCREEN
MRSA, PCR: NEGATIVE
Staphylococcus aureus: NEGATIVE

## 2019-05-03 NOTE — Progress Notes (Signed)
Konrad Felix PA   Pt has stopped ASA 7/18 per PCP Dr. Ellison Carwin EKG and labs are in [facility]

## 2019-05-05 NOTE — Progress Notes (Signed)
Anesthesia Chart Review   Case: [id number] Date/Time: 05/10/19 1238   Procedure: TOTAL HIP ARTHROPLASTY ANTERIOR APPROACH (Right )   Anesthesia type: Choice   Pre-op diagnosis: OA RIGHT HIP   Location: [facility] ROOM 08 / [facility]   Surgeon: Renette Butters, MD      DISCUSSION:73 y.o. former smoker (14.52 pack years, quit 02/10/09) with h/o HLD, HTN, prostate cancer, OA right hip scheduled for above procedure 05/10/2019 with Dr. [name].   Pt last seen by PCP, Dr. Harrel Lemon, 04/05/2019.  Per OV note, "Preop clearance. Reviewed his lab results. Performed EKG which I interpreted as normal sinus rhythm. He has no contraindications for having this type of surgery. I recommend he proceed with surgical hip replacement."  Anticipate pt can proceed with planned procedure barring acute status change.   VS: BP 116/69 (BP Location: Right Arm)   Pulse 90   Temp 37.1 C (Oral)   Resp 18   Ht 5\' 8"  (1.727 m)   Wt 80.5 kg   BMI 26.99 kg/m   PROVIDERS: Baxter Hire, MD is PCP    LABS: Labs reviewed: Acceptable for surgery. (all labs ordered are listed, but only abnormal results are displayed)  Labs Reviewed  BASIC METABOLIC PANEL - Abnormal; Notable for the following components:      Result Value   Glucose, Bld 120 (*)    BUN 29 (*)    Creatinine, Ser 1.41 (*)    GFR calc non Af Amer 48 (*)    GFR calc Af Amer 56 (*)    All other components within normal limits  SURGICAL PCR SCREEN  CBC     IMAGES:   EKG: 05/03/2019 Rate 74 bpm  Left anterior fascicular block Abnormal ECG No significant change since last tracing  CV:  Past Medical History:  Diagnosis Date  . Arthritis    "knees, hips" (04/03/2015)  . Cataract immature   . Diverticulosis   . ED (erectile dysfunction)   . History of kidney stones 2016  . Hx of colonic polyps   . Hyperlipidemia    takes Lipitor daily  . Hypertension    takes Prinizide daily  . Joint pain   . Joint swelling   . Left knee DJD   .  OA (osteoarthritis)   . Prostate cancer ([place])   . Seasonal allergies    takes Allegra daily    Past Surgical History:  Procedure Laterality Date  . BACK SURGERY    . COLONOSCOPY W/ BIOPSIES AND POLYPECTOMY  07/2011   "have had it once before that also"  . I&D EXTREMITY Left ~ 2010   knee  . JOINT REPLACEMENT    . KNEE ARTHROSCOPY Left ~ 2011  . LIPOMA EXCISION  07/2011   neck  . LUMBAR DISC SURGERY  1970's  . MULTIPLE TOOTH EXTRACTIONS  ~ 2007  . 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 REVERSE SHOULDER ARTHROPLASTY Left 12/02/2017   Procedure: REVERSE SHOULDER ARTHROPLASTY;  Surgeon: Hiram Gash, MD;  Location: [place];  Service: Orthopedics;  Laterality: Left;  . TONSILLECTOMY AND ADENOIDECTOMY  1940's  . TOTAL HIP ARTHROPLASTY Left 04/03/2015  . TOTAL HIP ARTHROPLASTY Left 04/03/2015   Procedure: TOTAL HIP ARTHROPLASTY ANTERIOR APPROACH;  Surgeon: Renette Butters, MD;  Location: [place];  Service: Orthopedics;  Laterality: Left;  . TOTAL KNEE ARTHROPLASTY  04/05/2012   Procedure: TOTAL KNEE ARTHROPLASTY;  Surgeon: Lorn Junes, MD;  Location: [place];  Service: Orthopedics;  Laterality: Right;  DR [place] THIS CASE  . TOTAL KNEE ARTHROPLASTY  08/02/2012   Procedure: TOTAL KNEE ARTHROPLASTY;  Surgeon: Lorn Junes, MD;  Location: [place];  Service: Orthopedics;  Laterality: Left;    MEDICATIONS: . acetaminophen (TYLENOL) 500 MG tablet  . aspirin EC 81 MG tablet  . atorvastatin (LIPITOR) 10 MG tablet  . cetirizine (ZYRTEC) 10 MG tablet  . fluticasone (FLONASE) 50 MCG/ACT nasal spray  . lisinopril-hydrochlorothiazide (PRINZIDE,ZESTORETIC) 20-25 MG per tablet  . meloxicam (MOBIC) 15 MG tablet  . Multiple Vitamin (MULTIVITAMIN WITH MINERALS) TABS tablet   No current facility-administered medications for this encounter.    Maia Plan [facility] Pre-Surgical Testing [phone] 05/05/19 9:04 AM

## 2019-05-05 NOTE — Anesthesia Preprocedure Evaluation (Addendum)
Anesthesia Evaluation  Patient identified by MRN, date of birth, ID band Patient awake    Reviewed: Allergy & Precautions, NPO status , Patient's Chart, lab work & pertinent test results  Airway Mallampati: I  TM Distance: >3 FB Neck ROM: Full    Dental  (+) Missing,    Pulmonary neg pulmonary ROS, former smoker,    Pulmonary exam normal breath sounds clear to auscultation       Cardiovascular hypertension, Normal cardiovascular exam Rhythm:Regular Rate:Normal  EKG 05/03/2019 Rate 74 bpm  Left anterior fascicular block Abnormal ECG No significant change since last tracing   Neuro/Psych negative neurological ROS  negative psych ROS   GI/Hepatic negative GI ROS, Neg liver ROS,   Endo/Other  negative endocrine ROS  Renal/GU negative Renal ROS  negative genitourinary   Musculoskeletal  (+) Arthritis ,   Abdominal   Peds  Hematology negative hematology ROS (+)   Anesthesia Other Findings   Reproductive/Obstetrics                           Anesthesia Physical Anesthesia Plan  ASA: II  Anesthesia Plan: Spinal   Post-op Pain Management:    Induction: Intravenous  PONV Risk Score and Plan:   Airway Management Planned: Natural Airway and Simple Face Mask  Additional Equipment:   Intra-op Plan:   Post-operative Plan:   Informed Consent: I have reviewed the patients History and Physical, chart, labs and discussed the procedure including the risks, benefits and alternatives for the proposed anesthesia with the patient or authorized representative who has indicated his/her understanding and acceptance.     Dental advisory given  Plan Discussed with: CRNA  Anesthesia Plan Comments:        Anesthesia Quick Evaluation

## 2019-05-06 ENCOUNTER — Other Ambulatory Visit (HOSPITAL_COMMUNITY)
Admission: RE | Admit: 2019-05-06 | Discharge: 2019-05-06 | Disposition: A | Discharge status: Home / Self Care | Payer: Medicare HMO | Source: Ambulatory Visit | Attending: Orthopedic Surgery | Admitting: Orthopedic Surgery

## 2019-05-06 DIAGNOSIS — Z01818 Encounter for other preprocedural examination: Secondary | ICD-10-CM | POA: Diagnosis not present

## 2019-05-07 LAB — SARS CORONAVIRUS 2 (TAT 6-24 HRS): SARS Coronavirus 2: NEGATIVE

## 2019-05-09 MED ORDER — BUPIVACAINE LIPOSOME 1.3 % IJ SUSP
10.0000 mL | Freq: Once | INTRAMUSCULAR | Status: DC
  Filled 2019-05-09: qty 10

## 2019-05-10 ENCOUNTER — Encounter (HOSPITAL_COMMUNITY): Payer: Self-pay

## 2019-05-10 ENCOUNTER — Other Ambulatory Visit: Payer: Self-pay

## 2019-05-10 ENCOUNTER — Inpatient Hospital Stay (HOSPITAL_COMMUNITY): Payer: Medicare HMO

## 2019-05-10 ENCOUNTER — Inpatient Hospital Stay (HOSPITAL_COMMUNITY): Payer: Medicare HMO | Admitting: Anesthesiology

## 2019-05-10 ENCOUNTER — Inpatient Hospital Stay (HOSPITAL_COMMUNITY)
Admission: RE | Admit: 2019-05-10 | Discharge: 2019-05-11 | DRG: 470 | Disposition: A | Discharge status: Home Health | Payer: Medicare HMO | Attending: Orthopedic Surgery | Admitting: Orthopedic Surgery

## 2019-05-10 ENCOUNTER — Encounter (HOSPITAL_COMMUNITY)
Admission: RE | Disposition: A | Discharge status: Home Health | Payer: Self-pay | Source: Home / Self Care | Attending: Orthopedic Surgery

## 2019-05-10 ENCOUNTER — Inpatient Hospital Stay (HOSPITAL_COMMUNITY): Payer: Medicare HMO | Admitting: Physician Assistant

## 2019-05-10 DIAGNOSIS — Z923 Personal history of irradiation: Secondary | ICD-10-CM

## 2019-05-10 DIAGNOSIS — Z96653 Presence of artificial knee joint, bilateral: Secondary | ICD-10-CM | POA: Diagnosis present

## 2019-05-10 DIAGNOSIS — Z96612 Presence of left artificial shoulder joint: Secondary | ICD-10-CM | POA: Diagnosis present

## 2019-05-10 DIAGNOSIS — Z96642 Presence of left artificial hip joint: Secondary | ICD-10-CM | POA: Diagnosis present

## 2019-05-10 DIAGNOSIS — Z87891 Personal history of nicotine dependence: Secondary | ICD-10-CM | POA: Diagnosis not present

## 2019-05-10 DIAGNOSIS — I10 Essential (primary) hypertension: Secondary | ICD-10-CM | POA: Diagnosis present

## 2019-05-10 DIAGNOSIS — Z7982 Long term (current) use of aspirin: Secondary | ICD-10-CM | POA: Diagnosis not present

## 2019-05-10 DIAGNOSIS — M1990 Unspecified osteoarthritis, unspecified site: Secondary | ICD-10-CM | POA: Diagnosis present

## 2019-05-10 DIAGNOSIS — Z8546 Personal history of malignant neoplasm of prostate: Secondary | ICD-10-CM

## 2019-05-10 DIAGNOSIS — Z791 Long term (current) use of non-steroidal anti-inflammatories (NSAID): Secondary | ICD-10-CM

## 2019-05-10 DIAGNOSIS — E785 Hyperlipidemia, unspecified: Secondary | ICD-10-CM | POA: Diagnosis present

## 2019-05-10 DIAGNOSIS — M1611 Unilateral primary osteoarthritis, right hip: Principal | ICD-10-CM | POA: Diagnosis present

## 2019-05-10 DIAGNOSIS — C61 Malignant neoplasm of prostate: Secondary | ICD-10-CM | POA: Diagnosis present

## 2019-05-10 DIAGNOSIS — K5790 Diverticulosis of intestine, part unspecified, without perforation or abscess without bleeding: Secondary | ICD-10-CM | POA: Diagnosis present

## 2019-05-10 DIAGNOSIS — Z7951 Long term (current) use of inhaled steroids: Secondary | ICD-10-CM

## 2019-05-10 DIAGNOSIS — Z1159 Encounter for screening for other viral diseases: Secondary | ICD-10-CM

## 2019-05-10 DIAGNOSIS — M1612 Unilateral primary osteoarthritis, left hip: Secondary | ICD-10-CM

## 2019-05-10 DIAGNOSIS — M1610 Unilateral primary osteoarthritis, unspecified hip: Secondary | ICD-10-CM | POA: Diagnosis present

## 2019-05-10 DIAGNOSIS — Z419 Encounter for procedure for purposes other than remedying health state, unspecified: Secondary | ICD-10-CM

## 2019-05-10 HISTORY — PX: TOTAL HIP ARTHROPLASTY: SHX124

## 2019-05-10 SURGERY — ARTHROPLASTY, HIP, TOTAL, ANTERIOR APPROACH
Anesthesia: Spinal | Laterality: Right

## 2019-05-10 MED ORDER — PHENOL 1.4 % MT LIQD: 1.0000 | OROMUCOSAL | Status: DC | PRN

## 2019-05-10 MED ORDER — DOCUSATE SODIUM 100 MG PO CAPS
100.0000 mg | ORAL_CAPSULE | Freq: Two times a day (BID) | ORAL | Status: DC
  Administered 2019-05-10 – 2019-05-11 (×2): 100 mg via ORAL
  Filled 2019-05-10 (×2): qty 1

## 2019-05-10 MED ORDER — PROPOFOL 10 MG/ML IV BOLUS
INTRAVENOUS | Status: DC | PRN
  Administered 2019-05-10: 20 mg via INTRAVENOUS
  Administered 2019-05-10: 10 mg via INTRAVENOUS
  Administered 2019-05-10: 20 mg via INTRAVENOUS

## 2019-05-10 MED ORDER — DEXAMETHASONE SODIUM PHOSPHATE 10 MG/ML IJ SOLN
INTRAMUSCULAR | Status: AC
  Filled 2019-05-10: qty 1

## 2019-05-10 MED ORDER — GABAPENTIN 100 MG PO CAPS
100.0000 mg | ORAL_CAPSULE | Freq: Two times a day (BID) | ORAL | Status: DC
  Administered 2019-05-10 – 2019-05-11 (×2): 100 mg via ORAL
  Filled 2019-05-10 (×2): qty 1

## 2019-05-10 MED ORDER — DEXAMETHASONE SODIUM PHOSPHATE 10 MG/ML IJ SOLN
10.0000 mg | Freq: Once | INTRAMUSCULAR | Status: AC
  Administered 2019-05-11: 10 mg via INTRAVENOUS
  Filled 2019-05-10: qty 1

## 2019-05-10 MED ORDER — ACETAMINOPHEN 500 MG PO TABS
1000.0000 mg | ORAL_TABLET | Freq: Once | ORAL | Status: AC
  Administered 2019-05-10: 11:00:00 1000 mg via ORAL

## 2019-05-10 MED ORDER — LACTATED RINGERS IV SOLN
INTRAVENOUS | Status: DC
  Administered 2019-05-10 (×2): via INTRAVENOUS

## 2019-05-10 MED ORDER — SODIUM CHLORIDE (PF) 0.9 % IJ SOLN
INTRAMUSCULAR | Status: AC
  Filled 2019-05-10: qty 20

## 2019-05-10 MED ORDER — METOCLOPRAMIDE HCL 5 MG/ML IJ SOLN: 5.0000 mg | Freq: Three times a day (TID) | INTRAMUSCULAR | Status: DC | PRN

## 2019-05-10 MED ORDER — FENTANYL CITRATE (PF) 100 MCG/2ML IJ SOLN: 25.0000 ug | INTRAMUSCULAR | Status: DC | PRN

## 2019-05-10 MED ORDER — CEFAZOLIN SODIUM-DEXTROSE 1-4 GM/50ML-% IV SOLN
1.0000 g | Freq: Four times a day (QID) | INTRAVENOUS | Status: AC
  Administered 2019-05-10 – 2019-05-11 (×2): 1 g via INTRAVENOUS
  Filled 2019-05-10 (×2): qty 50

## 2019-05-10 MED ORDER — BACLOFEN 10 MG PO TABS: 10.0000 mg | ORAL_TABLET | Freq: Two times a day (BID) | ORAL | 0 refills | Status: DC | PRN

## 2019-05-10 MED ORDER — ATORVASTATIN CALCIUM 10 MG PO TABS
10.0000 mg | ORAL_TABLET | Freq: Every day | ORAL | Status: DC
  Administered 2019-05-11: 10 mg via ORAL
  Filled 2019-05-10: qty 1

## 2019-05-10 MED ORDER — LACTATED RINGERS IV SOLN
INTRAVENOUS | Status: DC
  Administered 2019-05-10 – 2019-05-11 (×3): via INTRAVENOUS

## 2019-05-10 MED ORDER — GABAPENTIN 100 MG PO CAPS: 100.0000 mg | ORAL_CAPSULE | Freq: Two times a day (BID) | ORAL | 0 refills | Status: DC | PRN

## 2019-05-10 MED ORDER — CHLORHEXIDINE GLUCONATE 4 % EX LIQD: 60.0000 mL | Freq: Once | CUTANEOUS | Status: DC

## 2019-05-10 MED ORDER — HYDROCODONE-ACETAMINOPHEN 5-325 MG PO TABS: 1.0000 | ORAL_TABLET | Freq: Four times a day (QID) | ORAL | 0 refills | Status: DC | PRN

## 2019-05-10 MED ORDER — PROPOFOL 10 MG/ML IV BOLUS
INTRAVENOUS | Status: AC
  Filled 2019-05-10: qty 20

## 2019-05-10 MED ORDER — DEXAMETHASONE SODIUM PHOSPHATE 10 MG/ML IJ SOLN
INTRAMUSCULAR | Status: DC | PRN
  Administered 2019-05-10: 8 mg via INTRAVENOUS

## 2019-05-10 MED ORDER — CEFAZOLIN SODIUM-DEXTROSE 2-4 GM/100ML-% IV SOLN
2.0000 g | INTRAVENOUS | Status: AC
  Administered 2019-05-10: 13:00:00 2 g via INTRAVENOUS
  Filled 2019-05-10: qty 100

## 2019-05-10 MED ORDER — SODIUM CHLORIDE 0.9 % IV SOLN: 30.0000 ug/min | INTRAVENOUS | Status: DC

## 2019-05-10 MED ORDER — BUPIVACAINE IN DEXTROSE 0.75-8.25 % IT SOLN
INTRATHECAL | Status: DC | PRN
  Administered 2019-05-10: 1.8 mL via INTRATHECAL

## 2019-05-10 MED ORDER — SORBITOL 70 % SOLN
30.0000 mL | Freq: Every day | Status: DC | PRN
  Filled 2019-05-10: qty 30

## 2019-05-10 MED ORDER — ONDANSETRON HCL 4 MG PO TABS: 4.0000 mg | ORAL_TABLET | Freq: Three times a day (TID) | ORAL | 0 refills | Status: DC | PRN

## 2019-05-10 MED ORDER — MENTHOL 3 MG MT LOZG: 1.0000 | LOZENGE | OROMUCOSAL | Status: DC | PRN

## 2019-05-10 MED ORDER — SODIUM CHLORIDE FLUSH 0.9 % IV SOLN
INTRAVENOUS | Status: DC | PRN
  Administered 2019-05-10: 20 mL via INTRAVENOUS

## 2019-05-10 MED ORDER — PROPOFOL 500 MG/50ML IV EMUL
INTRAVENOUS | Status: DC | PRN
  Administered 2019-05-10: 75 ug/kg/min via INTRAVENOUS

## 2019-05-10 MED ORDER — POLYETHYLENE GLYCOL 3350 17 G PO PACK: 17.0000 g | PACK | Freq: Every day | ORAL | Status: DC | PRN

## 2019-05-10 MED ORDER — POVIDONE-IODINE 10 % EX SWAB
2.0000 "application " | Freq: Once | CUTANEOUS | Status: AC
  Administered 2019-05-10: 2 via TOPICAL

## 2019-05-10 MED ORDER — MORPHINE SULFATE (PF) 2 MG/ML IV SOLN: 0.5000 mg | INTRAVENOUS | Status: DC | PRN

## 2019-05-10 MED ORDER — ONDANSETRON HCL 4 MG/2ML IJ SOLN: 4.0000 mg | Freq: Four times a day (QID) | INTRAMUSCULAR | Status: DC | PRN

## 2019-05-10 MED ORDER — ACETAMINOPHEN 500 MG PO TABS
1000.0000 mg | ORAL_TABLET | Freq: Once | ORAL | Status: DC
  Filled 2019-05-10: qty 2

## 2019-05-10 MED ORDER — SODIUM CHLORIDE 0.9 % IV SOLN
INTRAVENOUS | Status: DC | PRN
  Administered 2019-05-10: 13:00:00 40 ug/min via INTRAVENOUS

## 2019-05-10 MED ORDER — BUPIVACAINE LIPOSOME 1.3 % IJ SUSP
INTRAMUSCULAR | Status: DC | PRN
  Administered 2019-05-10: 10 mL

## 2019-05-10 MED ORDER — ONDANSETRON HCL 4 MG PO TABS: 4.0000 mg | ORAL_TABLET | Freq: Four times a day (QID) | ORAL | Status: DC | PRN

## 2019-05-10 MED ORDER — ONDANSETRON HCL 4 MG/2ML IJ SOLN
INTRAMUSCULAR | Status: DC | PRN
  Administered 2019-05-10: 4 mg via INTRAVENOUS

## 2019-05-10 MED ORDER — KETOROLAC TROMETHAMINE 15 MG/ML IJ SOLN
7.5000 mg | Freq: Four times a day (QID) | INTRAMUSCULAR | Status: DC
  Administered 2019-05-10 – 2019-05-11 (×3): 7.5 mg via INTRAVENOUS
  Filled 2019-05-10 (×3): qty 1

## 2019-05-10 MED ORDER — ASPIRIN 81 MG PO CHEW
81.0000 mg | CHEWABLE_TABLET | Freq: Two times a day (BID) | ORAL | Status: DC
  Administered 2019-05-10 – 2019-05-11 (×2): 81 mg via ORAL
  Filled 2019-05-10 (×2): qty 1

## 2019-05-10 MED ORDER — DIPHENHYDRAMINE HCL 12.5 MG/5ML PO ELIX: 12.5000 mg | ORAL_SOLUTION | ORAL | Status: DC | PRN

## 2019-05-10 MED ORDER — ASPIRIN EC 81 MG PO TBEC: 81.0000 mg | DELAYED_RELEASE_TABLET | Freq: Two times a day (BID) | ORAL | 0 refills | Status: DC

## 2019-05-10 MED ORDER — BACLOFEN 10 MG PO TABS
10.0000 mg | ORAL_TABLET | Freq: Three times a day (TID) | ORAL | Status: DC | PRN
  Administered 2019-05-10: 10 mg via ORAL
  Filled 2019-05-10: qty 1

## 2019-05-10 MED ORDER — PROPOFOL 10 MG/ML IV BOLUS
INTRAVENOUS | Status: AC
  Filled 2019-05-10: qty 60

## 2019-05-10 MED ORDER — DOCUSATE SODIUM 100 MG PO CAPS: 100.0000 mg | ORAL_CAPSULE | Freq: Two times a day (BID) | ORAL | 0 refills | Status: DC

## 2019-05-10 MED ORDER — ACETAMINOPHEN 500 MG PO TABS
500.0000 mg | ORAL_TABLET | Freq: Four times a day (QID) | ORAL | Status: DC
  Administered 2019-05-10 – 2019-05-11 (×3): 500 mg via ORAL
  Filled 2019-05-10 (×3): qty 1

## 2019-05-10 MED ORDER — HYDROCODONE-ACETAMINOPHEN 5-325 MG PO TABS
1.0000 | ORAL_TABLET | ORAL | Status: DC | PRN
  Administered 2019-05-10 – 2019-05-11 (×2): 1 via ORAL
  Filled 2019-05-10 (×2): qty 1

## 2019-05-10 MED ORDER — MAGNESIUM CITRATE PO SOLN: 1.0000 | Freq: Once | ORAL | Status: DC | PRN

## 2019-05-10 MED ORDER — ACETAMINOPHEN 325 MG PO TABS: 325.0000 mg | ORAL_TABLET | Freq: Four times a day (QID) | ORAL | Status: DC | PRN

## 2019-05-10 MED ORDER — METOCLOPRAMIDE HCL 5 MG PO TABS: 5.0000 mg | ORAL_TABLET | Freq: Three times a day (TID) | ORAL | Status: DC | PRN

## 2019-05-10 MED ORDER — TRANEXAMIC ACID-NACL 1000-0.7 MG/100ML-% IV SOLN
1000.0000 mg | INTRAVENOUS | Status: AC
  Administered 2019-05-10: 1000 mg via INTRAVENOUS
  Filled 2019-05-10: qty 100

## 2019-05-10 MED ORDER — ONDANSETRON HCL 4 MG/2ML IJ SOLN
INTRAMUSCULAR | Status: AC
  Filled 2019-05-10: qty 2

## 2019-05-10 MED ORDER — POVIDONE-IODINE 10 % EX SWAB: 2.0000 "application " | Freq: Once | CUTANEOUS | Status: DC

## 2019-05-10 SURGICAL SUPPLY — 40 items
BLADE SAG 18X100X1.27 (BLADE) IMPLANT
BLADE SURG SZ10 CARB STEEL (BLADE) ×6 IMPLANT
CHLORAPREP W/TINT 26 (MISCELLANEOUS) ×3 IMPLANT
CLOSURE STERI-STRIP 1/2X4 (GAUZE/BANDAGES/DRESSINGS) ×1
CLSR STERI-STRIP ANTIMIC 1/2X4 (GAUZE/BANDAGES/DRESSINGS) ×2 IMPLANT
COVER PERINEAL POST (MISCELLANEOUS) ×3 IMPLANT
COVER SURGICAL LIGHT HANDLE (MISCELLANEOUS) ×3 IMPLANT
COVER WAND RF STERILE (DRAPES) IMPLANT
DECANTER SPIKE VIAL GLASS SM (MISCELLANEOUS) ×6 IMPLANT
DRAPE IMP U-DRAPE 54X76 (DRAPES) ×3 IMPLANT
DRAPE STERI IOBAN 125X83 (DRAPES) ×3 IMPLANT
DRAPE U-SHAPE 47X51 STRL (DRAPES) ×6 IMPLANT
DRSG MEPILEX BORDER 4X8 (GAUZE/BANDAGES/DRESSINGS) ×3 IMPLANT
ELECT BLADE TIP CTD 4 INCH (ELECTRODE) ×3 IMPLANT
GLOVE BIO SURGEON STRL SZ7.5 (GLOVE) ×6 IMPLANT
GLOVE BIOGEL PI IND STRL 8 (GLOVE) ×2 IMPLANT
GLOVE BIOGEL PI INDICATOR 8 (GLOVE) ×4
GOWN STRL REUS W/TWL LRG LVL3 (GOWN DISPOSABLE) ×3 IMPLANT
GOWN STRL REUS W/TWL XL LVL3 (GOWN DISPOSABLE) ×3 IMPLANT
HEAD FEMORAL HIP (Head) ×2 IMPLANT
HOLDER FOLEY CATH W/STRAP (MISCELLANEOUS) ×2 IMPLANT
INSERT TRIDENT POLY 36MM 0DEG (Insert) ×2 IMPLANT
KIT TURNOVER KIT A (KITS) IMPLANT
MANIFOLD NEPTUNE II (INSTRUMENTS) ×3 IMPLANT
NS IRRIG 1000ML POUR BTL (IV SOLUTION) ×3 IMPLANT
PACK ANTERIOR HIP CUSTOM (KITS) ×3 IMPLANT
PROTECTOR NERVE ULNAR (MISCELLANEOUS) ×3 IMPLANT
SCREW HEX LP 6.5X20 (Screw) ×2 IMPLANT
SHELL ACETABUL CLUSTER SZ 54 (Shell) ×2 IMPLANT
STEM HIP 127 DEG (Stem) ×2 IMPLANT
SUT MNCRL AB 4-0 PS2 18 (SUTURE) ×3 IMPLANT
SUT STRATAFIX 0 PDS 27 VIOLET (SUTURE) ×3
SUT VIC AB 0 CT1 36 (SUTURE) ×3 IMPLANT
SUT VIC AB 1 CT1 36 (SUTURE) ×3 IMPLANT
SUT VIC AB 2-0 CT1 27 (SUTURE) ×4
SUT VIC AB 2-0 CT1 TAPERPNT 27 (SUTURE) ×2 IMPLANT
SUTURE STRATFX 0 PDS 27 VIOLET (SUTURE) ×1 IMPLANT
TRAY FOLEY MTR SLVR 16FR STAT (SET/KITS/TRAYS/PACK) ×2 IMPLANT
WATER STERILE IRR 1000ML POUR (IV SOLUTION) ×6 IMPLANT
YANKAUER SUCT BULB TIP 10FT TU (MISCELLANEOUS) ×3 IMPLANT

## 2019-05-10 NOTE — Evaluation (Signed)
Physical Therapy Evaluation Patient Details Name: Andre Jackson MRN: [id number] DOB: [birth year] Today's Date: 05/10/2019   History of Present Illness  73 yo male s/p R DA-THA on 05/10/19. PMH includes L and R TKR, L THA, L shoulder arthroplasty, HLD, HTN, OA, back surgery.  Clinical Impression  Pt presents with R hip pain, decreased R hip strength post-operatively, increased time and effort to perform mobility tasks, and decreased activity tolerance. Pt to benefit from acute PT to address deficits. Pt ambulated 100 ft with min guard assist with verbal cuing for form and safety provided throughout. Pt educated on ankle pumps (20/hour) to perform this afternoon/evening to increase circulation, to pt's tolerance and limited by pain. PT to progress mobility as tolerated, and will continue to follow acutely.        Follow Up Recommendations Follow surgeon's recommendation for DC plan and follow-up therapies;Supervision for mobility/OOB    Equipment Recommendations  None recommended by PT    Recommendations for Other Services       Precautions / Restrictions Precautions Precautions: Fall Restrictions Weight Bearing Restrictions: No Other Position/Activity Restrictions: WBAT      Mobility  Bed Mobility Overal bed mobility: Needs Assistance Bed Mobility: Supine to Sit     Supine to sit: HOB elevated;Min guard     General bed mobility comments: min guard for safety, verbal cuing for sequencing. Increased time and effort.  Transfers Overall transfer level: Needs assistance Equipment used: Rolling walker (2 wheeled) Transfers: Sit to/from Stand Sit to Stand: Min guard;From elevated surface         General transfer comment: min guard for safety, verbal cuing for hand placement.  Ambulation/Gait Ambulation/Gait assistance: Min guard Gait Distance (Feet): 100 Feet Assistive device: Rolling walker (2 wheeled) Gait Pattern/deviations: Step-to pattern;Step-through  pattern;Decreased stride length;Trunk flexed Gait velocity: decr   General Gait Details: Min guard for safety. Verbal cuing for sequencing, placement in RW. Pt initially with unsteady gait, progressing to smooth gait with step-through pattern.  Stairs            Wheelchair Mobility    Modified Rankin (Stroke Patients Only)       Balance Overall balance assessment: Mild deficits observed, not formally tested                                           Pertinent Vitals/Pain Pain Assessment: 0-10 Pain Score: 2  Pain Location: R hip Pain Descriptors / Indicators: Sore Pain Intervention(s): Monitored during session;Limited activity within patient's tolerance;Premedicated before session;Repositioned    Home Living Family/patient expects to be discharged to:: Private residence Living Arrangements: Spouse/significant other Available Help at Discharge: Family;Available 24 hours/day Type of Home: House Home Access: Stairs to enter Entrance Stairs-Rails: None Entrance Stairs-Number of Steps: 2 Home Layout: One level Home Equipment: Walker - 2 wheels;Cane - single point;Bedside commode      Prior Function Level of Independence: Independent               Hand Dominance   Dominant Hand: Right    Extremity/Trunk Assessment   Upper Extremity Assessment Upper Extremity Assessment: Overall WFL for tasks assessed    Lower Extremity Assessment Lower Extremity Assessment: Overall WFL for tasks assessed;RLE deficits/detail RLE Deficits / Details: suspected post-operative weakness; able to perform ankle pumps, heel slides, quad sets RLE Sensation: WNL    Cervical / Trunk Assessment Cervical / Trunk  Assessment: Normal  Communication   Communication: No difficulties  Cognition Arousal/Alertness: Awake/alert Behavior During Therapy: WFL for tasks assessed/performed Overall Cognitive Status: Within Functional Limits for tasks assessed                                         General Comments      Exercises     Assessment/Plan    PT Assessment Patient needs continued PT services  PT Problem List Decreased strength;Decreased mobility;Decreased activity tolerance;Decreased balance;Decreased knowledge of use of DME;Pain       PT Treatment Interventions DME instruction;Therapeutic activities;Gait training;Therapeutic exercise;Patient/family education;Balance training;Stair training;Functional mobility training    PT Goals (Current goals can be found in the Care Plan section)  Acute Rehab PT Goals PT Goal Formulation: With patient Time For Goal Achievement: 05/17/19 Potential to Achieve Goals: Good    Frequency 7X/week   Barriers to discharge        Co-evaluation               AM-PAC PT "6 Clicks" Mobility  Outcome Measure Help needed turning from your back to your side while in a flat bed without using bedrails?: A Little Help needed moving from lying on your back to sitting on the side of a flat bed without using bedrails?: A Little Help needed moving to and from a bed to a chair (including a wheelchair)?: A Little Help needed standing up from a chair using your arms (e.g., wheelchair or bedside chair)?: A Little Help needed to walk in hospital room?: A Little Help needed climbing 3-5 steps with a railing? : A Little 6 Click Score: 18    End of Session Equipment Utilized During Treatment: Gait belt Activity Tolerance: Patient tolerated treatment well Patient left: in chair;with call bell/phone within reach;with chair alarm set;with SCD's reapplied Nurse Communication: Mobility status PT Visit Diagnosis: Other abnormalities of gait and mobility (R26.89);Difficulty in walking, not elsewhere classified (R26.2)    Time: 2836-6294 PT Time Calculation (min) (ACUTE ONLY): 21 min   Charges:   PT Evaluation $PT Eval Low Complexity: 1 Low         Talasia Saulter Conception Chancy, PT Acute Rehabilitation Services Pager  [phone]  Office [phone]   Donnella Morford D Elonda Husky 05/10/2019, 7:15 PM

## 2019-05-10 NOTE — Transfer of Care (Signed)
Immediate Anesthesia Transfer of Care Note  Patient: Andre Jackson  Procedure(s) Performed: Procedure(s): TOTAL HIP ARTHROPLASTY ANTERIOR APPROACH (Right)  Patient Location: PACU  Anesthesia Type:Spinal  Level of Consciousness:  sedated, patient cooperative and responds to stimulation  Airway & Oxygen Therapy:Patient Spontanous Breathing and Patient connected to face mask oxgen  Post-op Assessment:  Report given to PACU RN and Post -op Vital signs reviewed and stable  Post vital signs:  Reviewed and stable  Last Vitals:  Vitals:   05/10/19 1040 05/10/19 1130  BP: 116/78   Pulse: 100   Resp: 18   Temp: 37.5 C 37.5 C  SpO2: 00%     Complications: No apparent anesthesia complications

## 2019-05-10 NOTE — Interval H&P Note (Signed)
I participated in the care of this patient and agree with the above history, physical and evaluation. I performed a review of the history and a physical exam as detailed   Eura Mccauslin Daniel Jacy Howat MD  

## 2019-05-10 NOTE — Op Note (Signed)
05/10/2019  1:44 PM  PATIENT:  Andre Jackson   MRN: [id number]  PRE-OPERATIVE DIAGNOSIS:  OA RIGHT HIP  POST-OPERATIVE DIAGNOSIS:  OA RIGHT HIP  PROCEDURE:  Procedure(s): TOTAL HIP ARTHROPLASTY ANTERIOR APPROACH  PREOPERATIVE INDICATIONS:    Andre Jackson is an 73 y.o. male who has a diagnosis of Primary osteoarthritis of right hip and elected for surgical management after failing conservative treatment.  The risks benefits and alternatives were discussed with the patient including but not limited to the risks of nonoperative treatment, versus surgical intervention including infection, bleeding, nerve injury, periprosthetic fracture, the need for revision surgery, dislocation, leg length discrepancy, blood clots, cardiopulmonary complications, morbidity, mortality, among others, and they were willing to proceed.     OPERATIVE REPORT     SURGEON:   Renette Butters, MD    ASSISTANT:  Roxan Hockey, PA-C, he was present and scrubbed throughout the case, critical for completion in a timely fashion, and for retraction, instrumentation, and closure.     ANESTHESIA:  General    COMPLICATIONS:  None.     COMPONENTS:  Stryker acolade fit femur size 5 with a 36 mm +5 head ball and a PSL acetabular shell size 54 with a  polyethylene liner    PROCEDURE IN DETAIL:   The patient was met in the holding area and  identified.  The appropriate hip was identified and marked at the operative site.  The patient was then transported to the OR  and  placed under anesthesia per that record.  At that point, the patient was  placed in the supine position and  secured to the operating room table and all bony prominences padded. He received pre-operative antibiotics    The operative lower extremity was prepped from the iliac crest to the distal leg.  Sterile draping was performed.  Time out was performed prior to incision.      Skin incision was made just 2 cm lateral to the ASIS  extending in line  with the tensor fascia lata. Electrocautery was used to control all bleeders. I dissected down sharply to the fascia of the tensor fascia lata was confirmed that the muscle fibers beneath were running posteriorly. I then incised the fascia over the superficial tensor fascia lata in line with the incision. The fascia was elevated off the anterior aspect of the muscle the muscle was retracted posteriorly and protected throughout the case. I then used electrocautery to incise the tensor fascia lata fascia control and all bleeders. Immediately visible was the fat over top of the anterior neck and capsule.  I removed the anterior fat from the capsule and elevated the rectus muscle off of the anterior capsule. I then removed a large time of capsule. The retractors were then placed over the anterior acetabulum as well as around the superior and inferior neck.  I then made a femoral neck cut. Then used the power corkscrew to remove the femoral head from the acetabulum and thoroughly irrigated the acetabulum. I sized the femoral head.    I then exposed the deep acetabulum, cleared out any tissue including the ligamentum teres.   After adequate visualization, I excised the labrum, and then sequentially reamed.  I then impacted the acetabular implant into place using fluoroscopy for guidance.  Appropriate version and inclination was confirmed clinically matching their bony anatomy, and with fluoroscopy.  I placed a 20 mm screw in the posterior/superio position with an excellent bite.    I then placed the polyethylene  liner in place  I then adducted the leg and released the external rotators from the posterior femur allowing it to be easily delivered up lateral and anterior to the acetabulum for preparation of the femoral canal.    I then prepared the proximal femur using the cookie-cutter and then sequentially reamed and broached.  A trial broach, neck, and head was utilized, and I reduced the hip and used  floroscopy to assess the neck length and femoral implant.  I then impacted the femoral prosthesis into place into the appropriate version. The hip was then reduced and fluoroscopy confirmed appropriate position. Leg lengths were restored.  I then irrigated the hip copiously again with, and repaired the fascia with Vicryl, followed by monocryl for the subcutaneous tissue, Monocryl for the skin, Steri-Strips and sterile gauze. The patient was then awakened and returned to PACU in stable and satisfactory condition. There were no complications.  POST OPERATIVE PLAN: WBAT, DVT px: SCD's/TED, ambulation and chemical dvt px  [name], MD Orthopedic Surgeon [phone]

## 2019-05-10 NOTE — Anesthesia Postprocedure Evaluation (Signed)
Anesthesia Post Note  Patient: [name]  Procedure(s) Performed: TOTAL HIP ARTHROPLASTY ANTERIOR APPROACH (Right )     Patient location during evaluation: PACU Anesthesia Type: Spinal Level of consciousness: oriented and awake and alert Pain management: pain level controlled Vital Signs Assessment: post-procedure vital signs reviewed and stable Respiratory status: spontaneous breathing, respiratory function stable and patient connected to nasal cannula oxygen Cardiovascular status: blood pressure returned to baseline and stable Postop Assessment: no headache, no backache and no apparent nausea or vomiting Anesthetic complications: no    Last Vitals:  Vitals:   05/10/19 1600 05/10/19 1612  BP: 92/61 108/60  Pulse: 76 76  Resp: 20 16  Temp: 36.6 C   SpO2: 100% 100%    Last Pain:  Vitals:   05/10/19 1545  TempSrc:   PainSc: 0-No pain    LLE Motor Response: Purposeful movement (05/10/19 1600) LLE Sensation: Numbness (05/10/19 1600) RLE Motor Response: Purposeful movement (05/10/19 1600) RLE Sensation: Numbness (05/10/19 1600) L Sensory Level: L5-Outer lower leg, top of foot, great toe (05/10/19 1600) R Sensory Level: L5-Outer lower leg, top of foot, great toe (05/10/19 1600)  Julena Barbour L Graceson Nichelson

## 2019-05-10 NOTE — Discharge Instructions (Signed)

## 2019-05-10 NOTE — Anesthesia Procedure Notes (Signed)
Spinal  Patient location during procedure: OR Start time: 05/10/2019 12:25 PM End time: 05/10/2019 12:32 PM Reason for block: at surgeon's request Staffing Resident/CRNA: Anne Fu, CRNA Performed: resident/CRNA  Preanesthetic Checklist Completed: patient identified, site marked, surgical consent, pre-op evaluation, timeout performed, IV checked, risks and benefits discussed and monitors and equipment checked Spinal Block Patient position: sitting Prep: DuraPrep Patient monitoring: heart rate, continuous pulse ox and blood pressure Approach: midline Location: L2-3 Injection technique: single-shot Needle Needle type: Pencan  Needle gauge: 24 G Needle length: 9 cm Assessment Sensory level: T6 Additional Notes  Functioning IV was confirmed and monitors were applied. Expiration date of kit checked and confirmed. Sterile prep and drape, including hand hygiene and sterile gloves were used. The patient was positioned and the spine was prepped. The skin was anesthetized with lidocaine.  Free flow of clear CSF was obtained prior to injecting local anesthetic into the CSF X 1 attempt.  The spinal needle aspirated freely following injection.  The needle was carefully withdrawn. Patient tolerated procedure well, without complications. Loss of motor and sensory on exam post injection.

## 2019-05-11 ENCOUNTER — Encounter (HOSPITAL_COMMUNITY): Payer: Self-pay | Admitting: Orthopedic Surgery

## 2019-05-11 NOTE — Discharge Summary (Signed)
Discharge Summary  Patient ID: Andre Jackson MRN: [id number] DOB/AGE: 75-26-1947 75 y.o.  Admit date: 05/10/2019 Discharge date: 05/11/2019  Admission Diagnoses:  Primary osteoarthritis of right hip  Discharge Diagnoses:  Principal Problem:   Primary osteoarthritis of right hip Active Problems:   Hypertension   Hyperlipidemia   DJD (degenerative joint disease)   Prostate cancer ([place])   Primary localized osteoarthritis of hip   Past Medical History:  Diagnosis Date  . Arthritis    "knees, hips" (04/03/2015)  . Cataract immature   . Diverticulosis   . ED (erectile dysfunction)   . History of kidney stones 2016  . Hx of colonic polyps   . Hyperlipidemia    takes Lipitor daily  . Hypertension    takes Prinizide daily  . Joint pain   . Joint swelling   . Left knee DJD   . OA (osteoarthritis)   . Prostate cancer ([place])   . Seasonal allergies    takes Allegra daily    Surgeries: Procedure(s): TOTAL HIP ARTHROPLASTY ANTERIOR APPROACH on 05/10/2019   Consultants (if any):   Discharged Condition: Improved  Hospital Course: Andre Jackson is an 73 y.o. male who was admitted 05/10/2019 with a diagnosis of Primary osteoarthritis of right hip and went to the operating room on 05/10/2019 and underwent the above named procedures.    He was given perioperative antibiotics:  Anti-infectives (From admission, onward)   Start     Dose/Rate Route Frequency Ordered Stop   05/10/19 1830  ceFAZolin (ANCEF) IVPB 1 g/50 mL premix     1 g 100 mL/hr over 30 Minutes Intravenous Every 6 hours 05/10/19 1632 05/11/19 0115   05/10/19 1100  ceFAZolin (ANCEF) IVPB 2g/100 mL premix     2 g 200 mL/hr over 30 Minutes Intravenous On call to O.R. 05/10/19 1049 05/10/19 1235    .  He was given sequential compression devices, early ambulation, and aspirin for DVT prophylaxis.  He benefited maximally from the hospital stay and there were no complications.    Recent vital signs:  Vitals:    05/11/19 0116 05/11/19 0528  BP: 112/60 121/75  Pulse: 68 72  Resp: 16 20  Temp: 98.2 F (36.8 C) 99.4 F (37.4 C)  SpO2: 100% 100%    Recent laboratory studies:  Lab Results  Component Value Date   HGB 14.0 05/03/2019   HGB 14.1 01/20/2019   HGB 14.0 01/06/2019   Lab Results  Component Value Date   WBC 6.2 05/03/2019   PLT 243 05/03/2019   Lab Results  Component Value Date   INR 0.94 07/26/2012   Lab Results  Component Value Date   NA 138 05/03/2019   K 4.0 05/03/2019   CL 102 05/03/2019   CO2 27 05/03/2019   BUN 29 (H) 05/03/2019   CREATININE 1.41 (H) 05/03/2019   GLUCOSE 120 (H) 05/03/2019    Discharge Medications:   Allergies as of 05/11/2019   No Known Allergies     Medication List    TAKE these medications   acetaminophen 500 MG tablet Commonly known as: TYLENOL Take 500 mg by mouth daily as needed for moderate pain.   aspirin EC 81 MG tablet Take 1 tablet (81 mg total) by mouth 2 (two) times daily. For DVT prophylaxis for 30 days after surgery. What changed:   when to take this  additional instructions   atorvastatin 10 MG tablet Commonly known as: LIPITOR Take 10 mg by mouth daily.   baclofen  10 MG tablet Commonly known as: LIORESAL Take 1 tablet (10 mg total) by mouth 2 (two) times daily as needed for muscle spasms.   cetirizine 10 MG tablet Commonly known as: ZYRTEC Take 10 mg by mouth daily as needed for allergies.   docusate sodium 100 MG capsule Commonly known as: Colace Take 1 capsule (100 mg total) by mouth 2 (two) times daily. To prevent constipation while taking pain medication.   fluticasone 50 MCG/ACT nasal spray Commonly known as: FLONASE Place 2 sprays into the nose daily as needed for allergies.   gabapentin 100 MG capsule Commonly known as: Neurontin Take 1 capsule (100 mg total) by mouth 2 (two) times daily as needed for up to 14 days. For pain.   HYDROcodone-acetaminophen 5-325 MG tablet Commonly known as:  Norco Take 1-2 tablets by mouth every 6 (six) hours as needed for severe pain.   lisinopril-hydrochlorothiazide 20-25 MG tablet Commonly known as: ZESTORETIC Take 1 tablet by mouth daily.   meloxicam 15 MG tablet Commonly known as: MOBIC Take 15 mg by mouth daily.   multivitamin with minerals Tabs tablet Take 1 tablet by mouth daily.   ondansetron 4 MG tablet Commonly known as: Zofran Take 1 tablet (4 mg total) by mouth every 8 (eight) hours as needed for nausea or vomiting.       Diagnostic Studies: Dg C-arm 1-60 Min-no Report  Result Date: 05/10/2019 Fluoroscopy was utilized by the requesting physician.  No radiographic interpretation.   Dg Hip Operative Unilat W Or W/o Pelvis Right  Result Date: 05/10/2019 CLINICAL DATA:  Intraoperative fluoroscopy for right hip arthroplasty. EXAM: OPERATIVE RIGHT HIP (WITH PELVIS IF PERFORMED) 4 VIEWS TECHNIQUE: Fluoroscopic spot image(s) were submitted for interpretation post-operatively. COMPARISON:  None. FINDINGS: Submitted images show placement of femoral and acetabular components of a total right hip arthroplasty. The components are well seated and well aligned. No evidence an operative complication. IMPRESSION: Intraoperative imaging for total right hip arthroplasty. Prosthetic components are well positioned. Electronically Signed   By: Lajean Manes M.D.   On: 05/10/2019 16:03    Disposition:     Follow-up Information    Renette Butters, MD.   Specialty: Orthopedic Surgery Contact information: [address] [place] [address] [phone]            Signed: Prudencio Burly III PA-C 05/11/2019, 7:38 AM

## 2019-05-11 NOTE — Progress Notes (Signed)
    Subjective: Patient reports pain as mild, controlled.  Tolerating diet.  Urinating.  No CP, SOB.  Good early mobilization.   Objective:   VITALS:   Vitals:   05/10/19 1818 05/10/19 2028 05/11/19 0116 05/11/19 0528  BP: 117/64 125/70 112/60 121/75  Pulse: 78 80 68 72  Resp: 18 16 16 20   Temp: 98.5 F (36.9 C) 99 F (37.2 C) 98.2 F (36.8 C) 99.4 F (37.4 C)  TempSrc: Oral  Oral Oral  SpO2: 100% 100% 100% 100%  Weight:      Height:       CBC Latest Ref Rng & Units 05/03/2019 01/20/2019 01/06/2019  WBC 4.0 - 10.5 K/uL 6.2 5.0 5.9  Hemoglobin 13.0 - 17.0 g/dL 14.0 14.1 14.0  Hematocrit 39.0 - 52.0 % 44.1 43.1 43.1  Platelets 150 - 400 K/uL 243 195 207   BMP Latest Ref Rng & Units 05/03/2019 11/20/2017 03/23/2015  Glucose 70 - 99 mg/dL 120(H) 117(H) 104(H)  BUN 8 - 23 mg/dL 29(H) 17 14  Creatinine 0.61 - 1.24 mg/dL 1.41(H) 1.09 1.03  Sodium 135 - 145 mmol/L 138 139 138  Potassium 3.5 - 5.1 mmol/L 4.0 4.1 3.5  Chloride 98 - 111 mmol/L 102 101 100(L)  CO2 22 - 32 mmol/L 27 26 29   Calcium 8.9 - 10.3 mg/dL 9.4 9.3 9.2   Intake/Output      07/28 0701 - 07/29 0700 07/29 0701 - 07/30 0700   P.O. 240    I.V. (mL/kg) 3728.1 (46.3)    IV Piggyback 300    Total Intake(mL/kg) 4268.1 (53)    Urine (mL/kg/hr) 750    Blood 200    Total Output 950    Net +3318.1            Physical Exam: General: NAD.  Upright in bed.  Calm, conversant. Resp: No increased wob Cardio: regular rate and rhythm ABD soft Neurologically intact MSK RLE: Neurovascularly intact Sensation intact distally Feet warm Dorsiflexion/Plantar flexion intact Incision: dressing C/D/I   Assessment: 1 Day Post-Op  S/P Procedure(s) (LRB): TOTAL HIP ARTHROPLASTY ANTERIOR APPROACH (Right) by Dr. Ernesta Amble. Percell Jackson on 05/10/2019  Principal Problem:   Primary osteoarthritis of right hip Active Problems:   Hypertension   Hyperlipidemia   DJD (degenerative joint disease)   Prostate cancer ([place])   Primary  localized osteoarthritis of hip   Primary osteoarthritis, status post total hip arthroplasty Doing well postop day 1 Tolerating diet and voiding Pain controlled Mobilizing well  Plan: Up with therapy Incentive Spirometry Apply ice PRN  Weight Bearing: Weight Bearing as Tolerated (WBAT)  Dressings: Maintain Mepilex.   VTE prophylaxis: Aspirin, SCDs, ambulation Dispo: Home today   Andre Burly III, PA-C 05/11/2019, 7:36 AM

## 2019-05-11 NOTE — Progress Notes (Signed)
Physical Therapy Treatment Patient Details Name: Andre Jackson MRN: [id number] DOB: 01-26-1946 Today's Date: 05/11/2019    History of Present Illness 73 yo male s/p R DA-THA on 05/10/19. PMH includes L and R TKR, L THA, L shoulder arthroplasty, HLD, HTN, OA, back surgery.    PT Comments    Progressing very well with mobility. Reviewed/practiced exercises, gait training, and stair training. All education completed. Okay to d/c from PT standpoint.    Follow Up Recommendations  Follow surgeon's recommendation for DC plan and follow-up therapies     Equipment Recommendations  None recommended by PT    Recommendations for Other Services       Precautions / Restrictions Precautions Precautions: Fall Restrictions Weight Bearing Restrictions: No Other Position/Activity Restrictions: WBAT    Mobility  Bed Mobility               General bed mobility comments: oob in recliner  Transfers Overall transfer level: Needs assistance Equipment used: Rolling walker (2 wheeled) Transfers: Sit to/from Stand Sit to Stand: Supervision         General transfer comment: for safety.  Ambulation/Gait Ambulation/Gait assistance: Supervision Gait Distance (Feet): 200 Feet Assistive device: Rolling walker (2 wheeled) Gait Pattern/deviations: Step-through pattern;Decreased stride length         Stairs Stairs: Yes Stairs assistance: Supervision Stair Management: One rail Left;Forwards Number of Stairs: 2 General stair comments: VCs safety, technique, sequence. Pt used handrail on L to simulate door frame.   Wheelchair Mobility    Modified Rankin (Stroke Patients Only)       Balance Overall balance assessment: Mild deficits observed, not formally tested                                          Cognition Arousal/Alertness: Awake/alert Behavior During Therapy: WFL for tasks assessed/performed Overall Cognitive Status: Within Functional Limits for  tasks assessed                                        Exercises Total Joint Exercises Hip ABduction/ADduction: AROM;Right;10 reps;Standing Knee Flexion: AROM;Right;10 reps;Standing Marching in Standing: AROM;10 reps;Right;Left;Standing General Exercises - Lower Extremity Heel Raises: AROM;Both;10 reps;Standing    General Comments        Pertinent Vitals/Pain Pain Assessment: 0-10 Pain Score: 3  Pain Location: R hip Pain Descriptors / Indicators: Sore Pain Intervention(s): Monitored during session;Repositioned;Ice applied    Home Living                      Prior Function            PT Goals (current goals can now be found in the care plan section) Progress towards PT goals: Progressing toward goals    Frequency    7X/week      PT Plan Current plan remains appropriate    Co-evaluation              AM-PAC PT "6 Clicks" Mobility   Outcome Measure  Help needed turning from your back to your side while in a flat bed without using bedrails?: A Little Help needed moving from lying on your back to sitting on the side of a flat bed without using bedrails?: A Little Help needed moving to and from a bed to a  chair (including a wheelchair)?: A Little Help needed standing up from a chair using your arms (e.g., wheelchair or bedside chair)?: A Little Help needed to walk in hospital room?: A Little Help needed climbing 3-5 steps with a railing? : A Little 6 Click Score: 18    End of Session   Activity Tolerance: Patient tolerated treatment well Patient left: in chair;with call bell/phone within reach   PT Visit Diagnosis: Other abnormalities of gait and mobility (R26.89)     Time: 5830-7460 PT Time Calculation (min) (ACUTE ONLY): 9 min  Charges:  $Gait Training: 8-22 mins                        Weston Anna, [place] Pager: [phone] Office: [phone]

## 2019-05-11 NOTE — Plan of Care (Signed)
resolved 

## 2019-05-11 NOTE — Plan of Care (Signed)
  Problem: Education: Goal: Knowledge of General Education information will improve Description: Including pain rating scale, medication(s)/side effects and non-pharmacologic comfort measures Outcome: Progressing   Problem: Activity: Goal: Risk for activity intolerance will decrease Outcome: Progressing   Problem: Pain Managment: Goal: General experience of comfort will improve Outcome: Progressing   Problem: Activity: Goal: Ability to avoid complications of mobility impairment will improve Outcome: Progressing   Problem: Pain Management: Goal: Pain level will decrease with appropriate interventions Outcome: Progressing

## 2019-05-11 NOTE — TOC Transition Note (Addendum)
Transition of Care [facility]) - CM/SW Discharge Note   Patient Details  Name: Andre Jackson MRN: [id number] Date of Birth: [birth year]  Transition of Care [facility] CM/SW Contact:  Lia Hopping, [place] Phone Number: 05/11/2019, 10:54 AM   Clinical Narrative:    [place] with the patient insurance Patient had [facility] in the past for Physical Therapy, CSW reached out to [facility] rep. Patient accepted. CSW notified the patient at bedside.     Final next level of care: [place] Barriers to Discharge: No Barriers Identified   Patient Goals and CMS Choice Patient states their goals for this hospitalization and ongoing recovery are:: Recover at Home with therapy CMS Medicare.gov Compare Post Acute Care list provided to:: Patient Choice offered to / list presented to : Patient  Discharge Placement                       Discharge Plan and Services                          HH Arranged: PT [facility] Agency: [place] ([facility]) Date [place]: 05/11/19 Time [place]: 1000 Representative spoke with at [place]: [place] ([place]) Interventions     Readmission Risk Interventions No flowsheet data found.

## 2019-05-23 ENCOUNTER — Ambulatory Visit (INDEPENDENT_AMBULATORY_CARE_PROVIDER_SITE_OTHER): Payer: Medicare HMO | Admitting: Urology

## 2019-05-23 ENCOUNTER — Other Ambulatory Visit: Payer: Self-pay

## 2019-05-23 ENCOUNTER — Encounter: Payer: Self-pay | Admitting: Urology

## 2019-05-23 VITALS — BP 123/78 | HR 94 | Ht 68.0 in | Wt 177.6 lb

## 2019-05-23 DIAGNOSIS — C61 Malignant neoplasm of prostate: Secondary | ICD-10-CM

## 2019-05-23 MED ORDER — SILDENAFIL CITRATE 20 MG PO TABS: 20.0000 mg | ORAL_TABLET | ORAL | 11 refills | Status: DC | PRN

## 2019-05-23 NOTE — Patient Instructions (Signed)
Sildenafil tablets (Erectile Dysfunction) What is this medicine? SILDENAFIL (sil DEN a fil) is used to treat erection problems in men. This medicine may be used for other purposes; ask your health care provider or pharmacist if you have questions. COMMON BRAND NAME(S): Viagra What should I tell my health care provider before I take this medicine? They need to know if you have any of these conditions:  bleeding disorders  eye or vision problems, including a rare inherited eye disease called retinitis pigmentosa  anatomical deformation of the penis, Peyronie's disease, or history of priapism (painful and prolonged erection)  heart disease, angina, a history of heart attack, irregular heart beats, or other heart problems  high or low blood pressure  history of blood diseases, like sickle cell anemia or leukemia  history of stomach bleeding  kidney disease  liver disease  stroke  an unusual or allergic reaction to sildenafil, other medicines, foods, dyes, or preservatives  pregnant or trying to get pregnant  breast-feeding How should I use this medicine? Take this medicine by mouth with a glass of water. Follow the directions on the prescription label. The dose is usually taken 1 hour before sexual activity. You should not take the dose more than once per day. Do not take your medicine more often than directed. Talk to your pediatrician regarding the use of this medicine in children. This medicine is not used in children for this condition. Overdosage: If you think you have taken too much of this medicine contact a poison control center or emergency room at once. NOTE: This medicine is only for you. Do not share this medicine with others. What if I miss a dose? This does not apply. Do not take double or extra doses. What may interact with this medicine? Do not take this medicine with any of the following medications:  cisapride  nitrates like amyl nitrite, isosorbide  dinitrate, isosorbide mononitrate, nitroglycerin  riociguat This medicine may also interact with the following medications:  antiviral medicines for HIV or AIDS  bosentan  certain medicines for benign prostatic hyperplasia (BPH)  certain medicines for blood pressure  certain medicines for fungal infections like ketoconazole and itraconazole  cimetidine  erythromycin  rifampin This list may not describe all possible interactions. Give your health care provider a list of all the medicines, herbs, non-prescription drugs, or dietary supplements you use. Also tell them if you smoke, drink alcohol, or use illegal drugs. Some items may interact with your medicine. What should I watch for while using this medicine? If you notice any changes in your vision while taking this drug, call your doctor or health care professional as soon as possible. Stop using this medicine and call your health care provider right away if you have a loss of sight in one or both eyes. Contact your doctor or health care professional right away if you have an erection that lasts longer than 4 hours or if it becomes painful. This may be a sign of a serious problem and must be treated right away to prevent permanent damage. If you experience symptoms of nausea, dizziness, chest pain or arm pain upon initiation of sexual activity after taking this medicine, you should refrain from further activity and call your doctor or health care professional as soon as possible. Do not drink alcohol to excess (examples, 5 glasses of wine or 5 shots of whiskey) when taking this medicine. When taken in excess, alcohol can increase your chances of getting a headache or getting dizzy, increasing  your heart rate or lowering your blood pressure. Using this medicine does not protect you or your partner against HIV infection (the virus that causes AIDS) or other sexually transmitted diseases. What side effects may I notice from receiving this  medicine? Side effects that you should report to your doctor or health care professional as soon as possible:  allergic reactions like skin rash, itching or hives, swelling of the face, lips, or tongue  breathing problems  changes in hearing  changes in vision  chest pain  fast, irregular heartbeat  prolonged or painful erection  seizures Side effects that usually do not require medical attention (report to your doctor or health care professional if they continue or are bothersome):  back pain  dizziness  flushing  headache  indigestion  muscle aches  nausea  stuffy or runny nose This list may not describe all possible side effects. Call your doctor for medical advice about side effects. You may report side effects to FDA at 1-800-FDA-1088. Where should I keep my medicine? Keep out of reach of children. Store at room temperature between 15 and 30 degrees C (59 and 86 degrees F). Throw away any unused medicine after the expiration date. NOTE: This sheet is a summary. It may not cover all possible information. If you have questions about this medicine, talk to your doctor, pharmacist, or health care provider.  2020 Elsevier/Gold Standard (2015-09-12 12:00:25)  

## 2019-05-23 NOTE — Progress Notes (Signed)
   05/23/2019 11:55 AM   Andre Jackson Jan 11, 1946 [id number]  Reason for visit: Follow up prostate cancer  HPI: I saw Andre Jackson back in urology clinic for follow-up of prostate cancer.  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   He completed external beam radiation therapy with Dr. Baruch Gouty in spring 2020.  He has done extremely well since then and denies any urinary symptoms of weak stream, dysuria, or nocturia.  He has baseline erectile dysfunction that is well controlled with 2 to 3 tablets of Revatio on demand.  Post-treatment PSA on 04/29/2019 was 1.9.  ROS: Please see flowsheet from today's date for complete review of systems.  Physical Exam: BP 123/78   Pulse 94   Ht 5\' 8"  (1.727 m)   Wt 177 lb 9.6 oz (80.6 kg)   BMI 27.00 kg/m    Constitutional:  Alert and oriented, No acute distress. Respiratory: Normal respiratory effort, no increased work of breathing. GI: Abdomen is soft, nontender, nondistended, no abdominal masses Skin: No rashes, bruises or suspicious lesions. Neurologic: Grossly intact, no focal deficits, moving all 4 extremities. Psychiatric: Normal mood and affect  Laboratory Data: Pre-treatment PSA 4.7 Post-treatment PSA 04/29/2019 1.9  Assessment & Plan:   In summary, the patient is a 73 year old male with high-volume intermediate risk prostate cancer status post external beam radiation therapy in spring 2019.  First post-treatment PSA was 1.9 on 04/29/2019.  He is having no adverse symptoms from radiation and overall is doing very well.  We discussed at length the need for long-term surveillance of PSA.  RTC 6 months with PSA  Sildenafil refilled  A total of 15 minutes were spent face-to-face with the patient, greater than  50% was spent in patient education, counseling, and coordination of care regarding prostate cancer and need for ongoing PSA surveillance.   Billey Co, [facility] [address], [place], [place] [address] [phone]

## 2019-06-08 ENCOUNTER — Other Ambulatory Visit: Payer: Self-pay

## 2019-06-09 ENCOUNTER — Other Ambulatory Visit: Payer: Self-pay

## 2019-06-09 ENCOUNTER — Inpatient Hospital Stay: Payer: Medicare HMO | Attending: Radiation Oncology

## 2019-06-09 DIAGNOSIS — C61 Malignant neoplasm of prostate: Secondary | ICD-10-CM

## 2019-06-09 LAB — PSA: Prostatic Specific Antigen: 1.08 ng/mL (ref 0.00–4.00)

## 2019-06-16 ENCOUNTER — Ambulatory Visit: Payer: Medicare HMO | Admitting: Radiation Oncology

## 2019-06-21 ENCOUNTER — Other Ambulatory Visit: Payer: Self-pay

## 2019-06-21 ENCOUNTER — Other Ambulatory Visit: Payer: Self-pay | Admitting: *Deleted

## 2019-06-21 ENCOUNTER — Ambulatory Visit
Admission: RE | Admit: 2019-06-21 | Discharge: 2019-06-21 | Disposition: A | Discharge status: Home / Self Care | Payer: Medicare HMO | Source: Ambulatory Visit | Attending: Radiation Oncology | Admitting: Radiation Oncology

## 2019-06-21 ENCOUNTER — Encounter: Payer: Self-pay | Admitting: Radiation Oncology

## 2019-06-21 VITALS — BP 130/78 | HR 109 | Temp 97.9°F | Resp 18 | Wt 173.4 lb

## 2019-06-21 DIAGNOSIS — C61 Malignant neoplasm of prostate: Secondary | ICD-10-CM | POA: Diagnosis present

## 2019-06-21 DIAGNOSIS — Z923 Personal history of irradiation: Secondary | ICD-10-CM | POA: Insufficient documentation

## 2019-06-21 NOTE — Progress Notes (Signed)
Radiation Oncology Follow up Note  Name: Andre Jackson   Date:   06/21/2019 MRN:  [id number] DOB: Aug 04, 1946    This 73 y.o. male presents to the clinic today for 62-month follow-up status post.  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  He specifically denies any increased lower urinary tract symptoms diarrhea or fatigue.  His most recent PSA is [id number].  COMPLICATIONS OF TREATMENT: none  FOLLOW UP COMPLIANCE: keeps appointments   PHYSICAL EXAM:  BP 130/78   Pulse (!) 109   Temp 97.9 F (36.6 C)   Resp 18   Wt 173 lb 6.4 oz (78.7 kg)   BMI 26.37 kg/m  Well-developed well-nourished patient in NAD. HEENT reveals PERLA, EOMI, discs not visualized.  Oral cavity is clear. No oral mucosal lesions are identified. Neck is clear without evidence of cervical or supraclavicular adenopathy. Lungs are clear to A&P. Cardiac examination is essentially unremarkable with regular rate and rhythm without murmur rub or thrill. Abdomen is benign with no organomegaly or masses noted. Motor sensory and DTR levels are equal and symmetric in the upper and lower extremities. Cranial nerves II through XII are grossly intact. Proprioception is intact. No peripheral adenopathy or edema is identified. No motor or sensory levels are noted. Crude visual fields are within normal range.  RADIOLOGY RESULTS: No current films to review  PLAN: Present time patient is doing well under excellent biochemical control of his prostate cancer.  He has a very low side effect profile.  I am pleased with his overall progress.  I have asked to see him back in 6 months for follow-up with a PSA prior to that visit.  Patient knows to call at anytime  with any concerns.  I would like to take this opportunity to thank you for allowing me to participate in the care of your patient.Noreene Filbert, MD

## 2019-08-12 IMAGING — CT CT SHOULDER*L* W/O CM
3 series · 14 of 33 positions shown, 17 images · non-contrast
Comparison: None.

CLINICAL DATA: Left shoulder pain for 1 year. Limited range of
motion.

EXAM:
CT OF THE UPPER LEFT EXTREMITY WITHOUT CONTRAST
TECHNIQUE: Multidetector CT imaging of the upper left extremity was performed
according to the standard protocol.

[Series 2: soft tissue · axial · 0.53mm/px · z∈[-576,-436]mm · 6 of 91 slices shown, 8 images]
[im 14/91  soft-tissue]
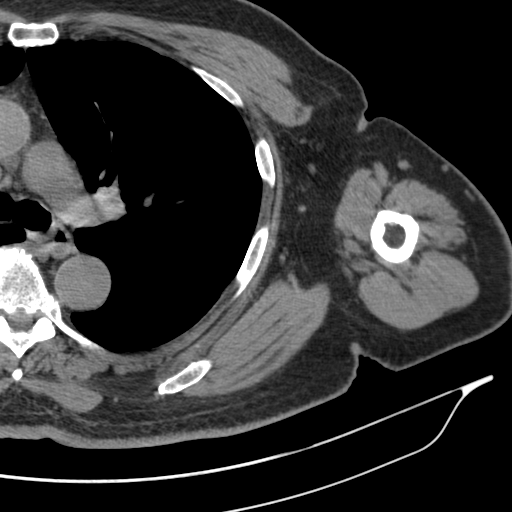
[im 14/91  bone]
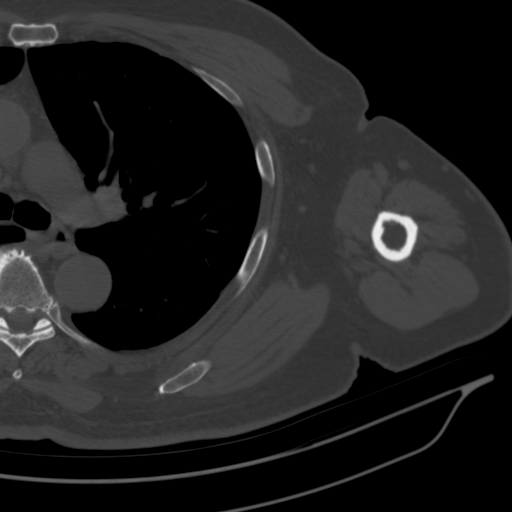
[im 28/91  bone]
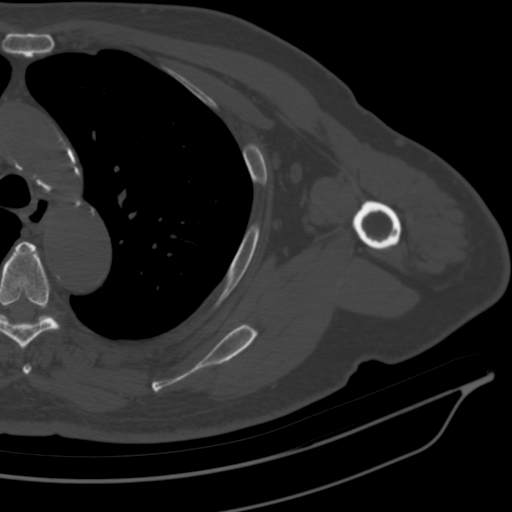
[im 42/91  bone]
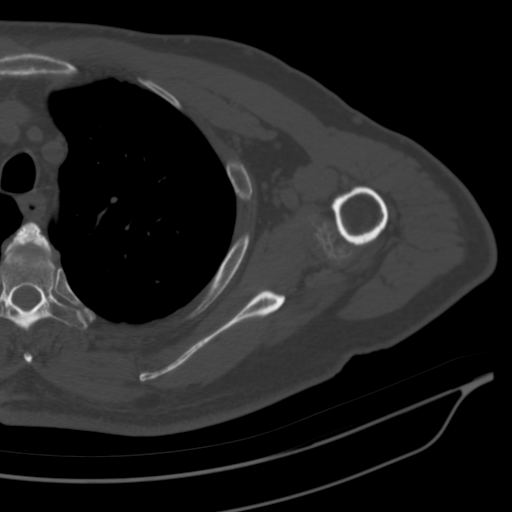
[im 56/91  bone]
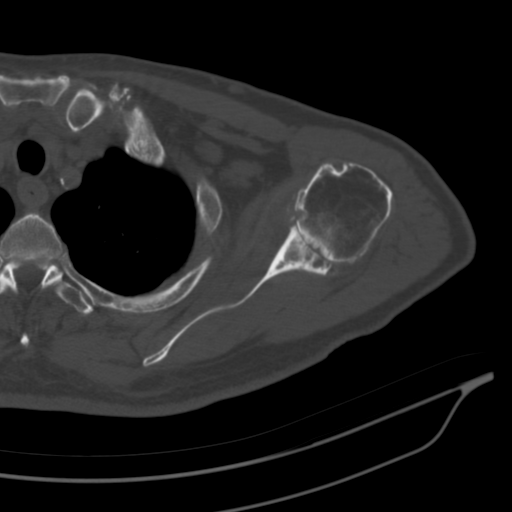
[im 70/91  soft-tissue]
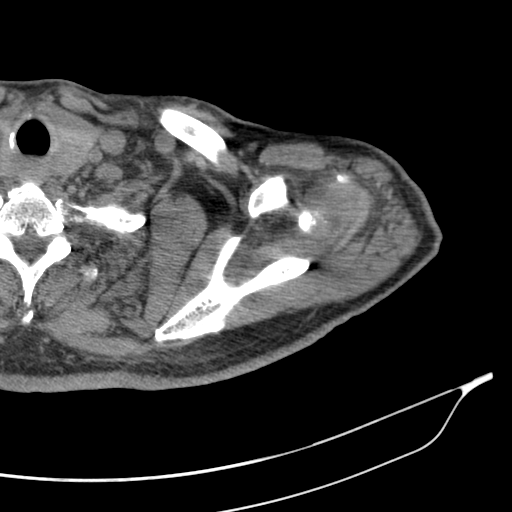
[im 70/91  bone]
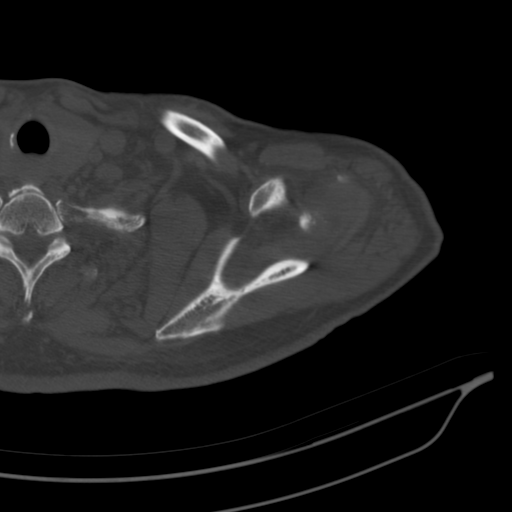
[im 84/91  bone]
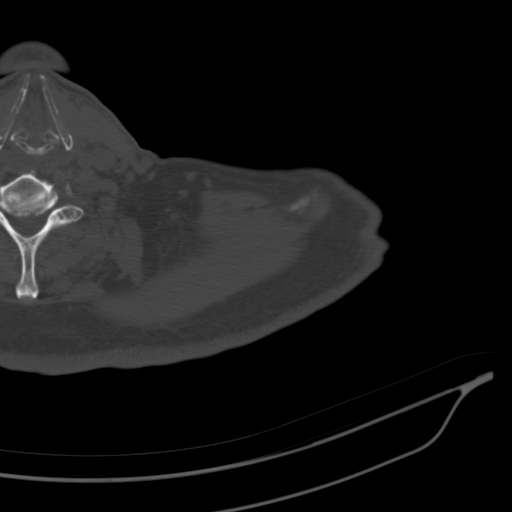

[Series 7: cor soft · coronal · 0.38mm/px · 3 of 114 slices shown]
[im 23/114  bone]
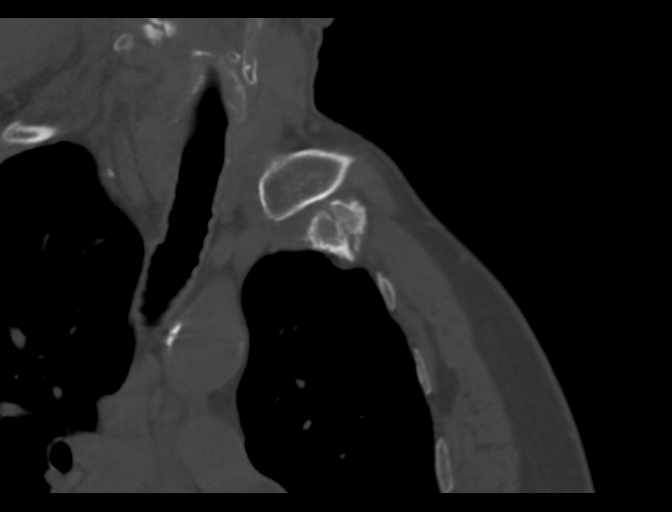
[im 46/114  bone]
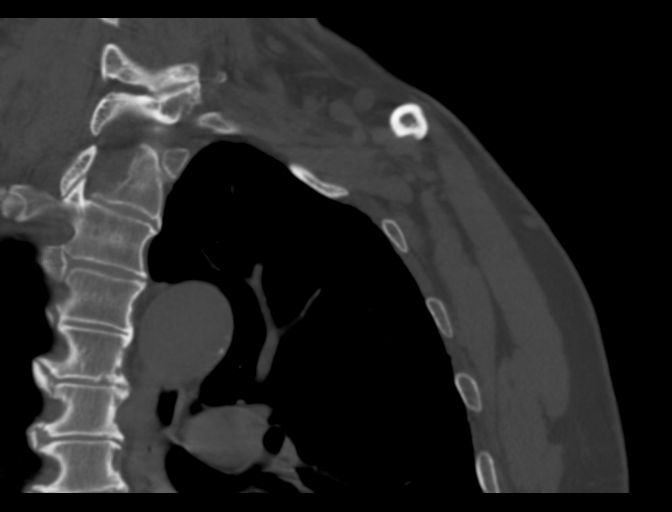
[im 68/114  bone]
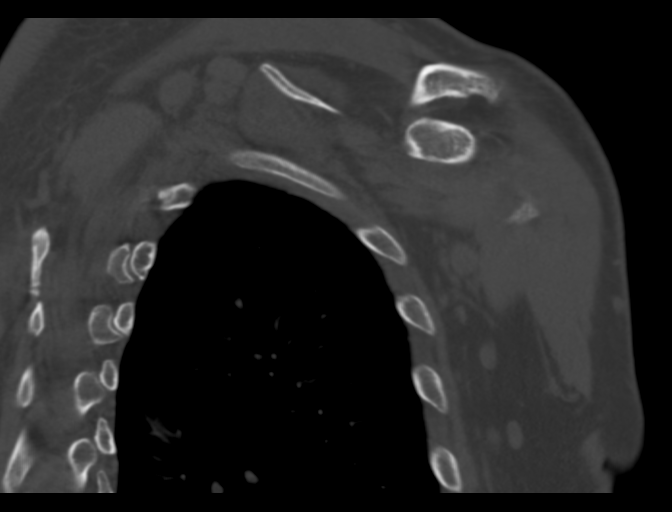

[Series 8: sag bone · sagittal · 0.35mm/px · 5 of 130 slices shown, 6 images]
[im 44/130  bone]
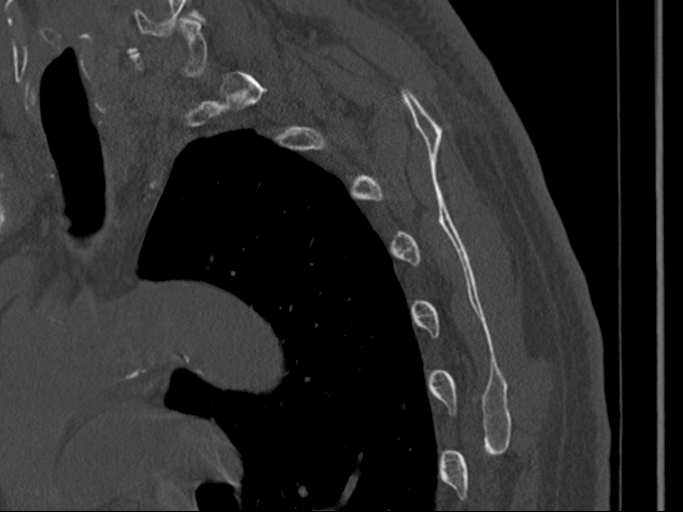
[im 54/130  bone]
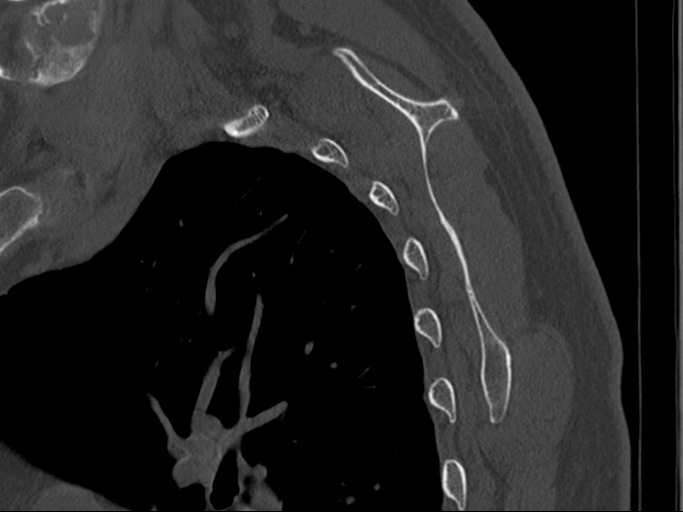
[im 65/130  soft-tissue]
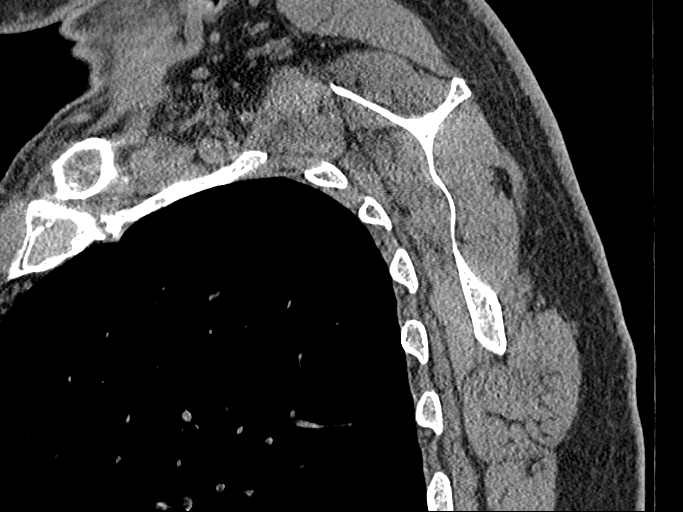
[im 65/130  bone]
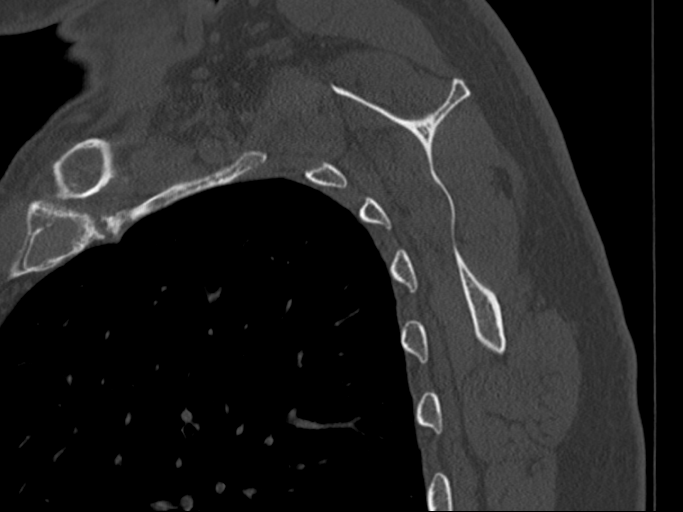
[im 76/130  bone]
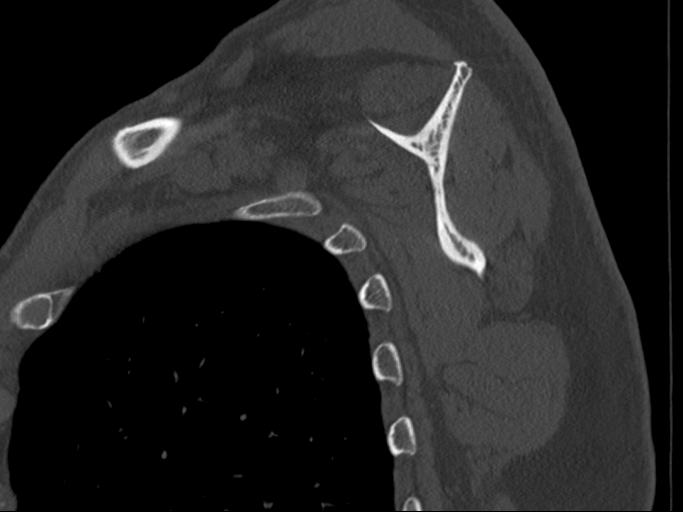
[im 87/130  bone]
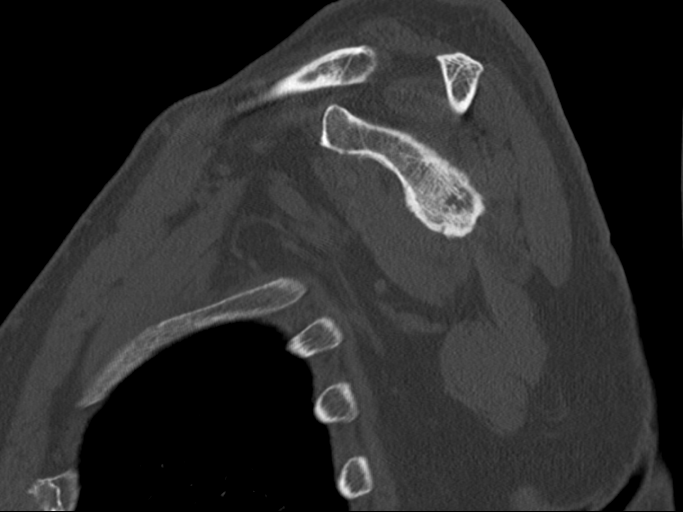

[14 of 33 positions shown; findings below may reference images not displayed]

FINDINGS: Bones/Joint/Cartilage

No fracture or dislocation. Normal alignment. No joint effusion.

Severe left glenohumeral joint space narrowing with a bone-on-bone
appearance, subchondral cystic changes, subchondral sclerosis and
inferior marginal osteophytosis.

Moderate osteoarthritis of the acromioclavicular joint. Type II
acromion.

Degenerative disc disease with disc height loss and a broad-based
disc osteophyte complex at C5-6. Left uncovertebral degenerative
changes at C5-6 resulting in left foraminal narrowing.

No aggressive osseous lesion.

Ligaments

Ligaments are suboptimally evaluated by CT.

Muscles and Tendons
Muscles are normal.  No muscle atrophy.

Soft tissue
No fluid collection or hematoma. No soft tissue mass. Visualized
left lung is clear.
IMPRESSION: 1. Severe advanced osteoarthritis of the left glenohumeral joint.
2. Moderate arthropathy of the left acromioclavicular joint.

## 2019-11-03 ENCOUNTER — Ambulatory Visit: Payer: Medicare HMO | Attending: Internal Medicine

## 2019-11-03 DIAGNOSIS — Z20822 Contact with and (suspected) exposure to covid-19: Secondary | ICD-10-CM

## 2019-11-04 LAB — NOVEL CORONAVIRUS, NAA: SARS-CoV-2, NAA: NOT DETECTED

## 2019-11-15 ENCOUNTER — Encounter: Payer: Self-pay | Admitting: *Deleted

## 2019-11-28 ENCOUNTER — Other Ambulatory Visit: Payer: Self-pay

## 2019-11-28 ENCOUNTER — Other Ambulatory Visit: Payer: Medicare HMO

## 2019-11-28 DIAGNOSIS — C61 Malignant neoplasm of prostate: Secondary | ICD-10-CM

## 2019-11-29 LAB — PSA: Prostate Specific Ag, Serum: 0.7 ng/mL (ref 0.0–4.0)

## 2019-11-30 ENCOUNTER — Encounter: Payer: Self-pay | Admitting: Urology

## 2019-11-30 ENCOUNTER — Other Ambulatory Visit: Payer: Self-pay

## 2019-11-30 ENCOUNTER — Ambulatory Visit (INDEPENDENT_AMBULATORY_CARE_PROVIDER_SITE_OTHER): Payer: Medicare HMO | Admitting: Urology

## 2019-11-30 VITALS — BP 122/72 | HR 93 | Ht 68.0 in | Wt 180.0 lb

## 2019-11-30 DIAGNOSIS — C61 Malignant neoplasm of prostate: Secondary | ICD-10-CM | POA: Diagnosis not present

## 2019-11-30 DIAGNOSIS — N529 Male erectile dysfunction, unspecified: Secondary | ICD-10-CM

## 2019-11-30 MED ORDER — SILDENAFIL CITRATE 20 MG PO TABS: 20.0000 mg | ORAL_TABLET | ORAL | 11 refills | Status: DC | PRN

## 2019-11-30 NOTE — Progress Notes (Signed)
   11/30/2019 9:45 AM   Andre Jackson [birth year] [id number]  Reason for visit: Follow up prostate cancer  HPI: I saw Andre Jackson back in urology clinic for follow-up of prostate cancer.  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   He completed external beam radiation therapy with Dr. Baruch Gouty in spring 2020.  He continues to do extremely well since and denies any urinary symptoms of weak stream, dysuria, or nocturia.  He has baseline erectile dysfunction that is well controlled with 2 to 3 tablets of Revatio on demand.  Post-treatment PSA on 04/29/2019 was 1.9, and PSA today is 0.7 indicating excellent control of his prostate cancer.  We discussed the need for ongoing surveillance and the risk of recurrence, as well as the side effects of sildenafil.  -RTC 6 months with PSA prior, likely space to yearly visits at that time if PSA stable -Sildenafil refilled and goodRx coupon provided  I spent 20 total minutes on the day of the encounter including pre-visit review of the medical record, face-to-face time with the patient, and post visit ordering of labs/imaging/tests.  Billey Co, [facility] [address], [place], [place] [address] [phone]

## 2019-12-01 ENCOUNTER — Ambulatory Visit: Payer: Medicare HMO | Admitting: Urology

## 2019-12-12 ENCOUNTER — Inpatient Hospital Stay: Payer: Medicare HMO | Attending: Radiation Oncology

## 2019-12-19 ENCOUNTER — Ambulatory Visit: Payer: Medicare HMO | Admitting: Radiation Oncology

## 2019-12-21 ENCOUNTER — Other Ambulatory Visit: Payer: Self-pay

## 2019-12-21 ENCOUNTER — Encounter: Payer: Self-pay | Admitting: Radiation Oncology

## 2019-12-22 ENCOUNTER — Ambulatory Visit
Admission: RE | Admit: 2019-12-22 | Discharge: 2019-12-22 | Disposition: A | Discharge status: Home / Self Care | Payer: Medicare HMO | Source: Ambulatory Visit | Attending: Radiation Oncology | Admitting: Radiation Oncology

## 2019-12-22 VITALS — BP 131/76 | HR 84 | Temp 97.3°F | Resp 16 | Wt 179.5 lb

## 2019-12-22 DIAGNOSIS — Z923 Personal history of irradiation: Secondary | ICD-10-CM | POA: Insufficient documentation

## 2019-12-22 DIAGNOSIS — C61 Malignant neoplasm of prostate: Secondary | ICD-10-CM | POA: Diagnosis present

## 2019-12-22 NOTE — Progress Notes (Signed)
Radiation Oncology Follow up Note  Name: Andre Jackson   Date:   12/22/2019 MRN:  [id number] DOB: November 13, 1945    This 74 y.o. male presents to the clinic today for 32-month follow-up status post IMRT radiation therapy to his prostate for stage IIa adenocarcinoma.  REFERRING PROVIDER: Baxter Hire, MD  HPI: Patient is a 74 year old male now at 10 months having completed IMRT to his prostate for stage IIa Gleason 7 adenocarcinoma prostate presenting with a PSA of 4.7. He seen today in routine follow-up is doing well specifically denies any increased lower urinary tract symptoms diarrhea or fatigue.Marland Kitchen His most recent PSA is 0.7 down from 1.97 months prior.  COMPLICATIONS OF TREATMENT: none  FOLLOW UP COMPLIANCE: keeps appointments   PHYSICAL EXAM:  BP 131/76   Pulse 84   Temp (!) 97.3 F (36.3 C)   Resp 16   Wt 179 lb 8 oz (81.4 kg)   SpO2 99%   BMI 27.29 kg/m  Well-developed well-nourished patient in NAD. HEENT reveals PERLA, EOMI, discs not visualized.  Oral cavity is clear. No oral mucosal lesions are identified. Neck is clear without evidence of cervical or supraclavicular adenopathy. Lungs are clear to A&P. Cardiac examination is essentially unremarkable with regular rate and rhythm without murmur rub or thrill. Abdomen is benign with no organomegaly or masses noted. Motor sensory and DTR levels are equal and symmetric in the upper and lower extremities. Cranial nerves II through XII are grossly intact. Proprioception is intact. No peripheral adenopathy or edema is identified. No motor or sensory levels are noted. Crude visual fields are within normal range.  RADIOLOGY RESULTS: No current films to review  PLAN: Present time patient is under excellent biochemical control of his prostate cancer. And pleased with his overall progress. I have asked to see him back in 1 year for follow-up with a PSA at that time. Patient knows to call sooner with any concerns at any time.  I would  like to take this opportunity to thank you for allowing me to participate in the care of your patient.Noreene Filbert, MD

## 2020-05-25 ENCOUNTER — Other Ambulatory Visit: Payer: Medicare HMO

## 2020-05-25 ENCOUNTER — Other Ambulatory Visit: Payer: Self-pay

## 2020-05-25 DIAGNOSIS — C61 Malignant neoplasm of prostate: Secondary | ICD-10-CM

## 2020-05-26 LAB — PSA: Prostate Specific Ag, Serum: 0.3 ng/mL (ref 0.0–4.0)

## 2020-05-30 ENCOUNTER — Other Ambulatory Visit: Payer: Self-pay

## 2020-05-30 ENCOUNTER — Ambulatory Visit: Payer: Medicare HMO | Admitting: Urology

## 2020-05-30 ENCOUNTER — Encounter: Payer: Self-pay | Admitting: Urology

## 2020-05-30 VITALS — BP 125/77 | HR 87 | Ht 68.0 in | Wt 172.3 lb

## 2020-05-30 DIAGNOSIS — N529 Male erectile dysfunction, unspecified: Secondary | ICD-10-CM | POA: Diagnosis not present

## 2020-05-30 DIAGNOSIS — C61 Malignant neoplasm of prostate: Secondary | ICD-10-CM

## 2020-05-30 DIAGNOSIS — R972 Elevated prostate specific antigen [PSA]: Secondary | ICD-10-CM

## 2020-05-30 MED ORDER — SILDENAFIL CITRATE 20 MG PO TABS: 20.0000 mg | ORAL_TABLET | ORAL | 11 refills | Status: DC | PRN

## 2020-05-30 NOTE — Progress Notes (Signed)
   05/30/2020 9:13 AM   Andre Jackson November 10, 1945 [id number]  Reason for visit: Follow up prostate cancer and erectile dysfunction  HPI: I saw Andre Jackson back in urology clinic for follow-up of prostate cancer and erectile dysfunction. Briefly he is a 74 year old African-American male with a family history of non-lethal prostate cancer who ultimately was diagnosed with favorable intermediate risk prostate cancer with PSA of 4.7, and 8/12 cores positive for Gleason score 3+4=7 with max core involvement greater than 90%.  He completed external beam radiation therapy with Dr. Baruch Gouty in spring 2020. He continues to do extremely well since and denies any urinary significant urinary symptoms.  He will occasionally have some slower stream 1st void in the morning, but this resolves after and he really has no urinary complaints today.  He denies any dysuria, gross hematuria, or nocturia. He has erectile dysfunction that is well controlled with 2 to 3 tablets of Revatio on demand and he continues to do well with this medication as needed. Post-treatment PSA on 04/29/2019 was 1.9, and PSA today is 0.3 indicating excellent control of his prostate cancer.  We discussed the need for ongoing surveillance and the risk of recurrence, as well as the risks, benefits, and side effects of sildenafil.  -RTC 1 year with PSA prior -Sildenafil refilled, risks and benefits discussed   Billey Co, MD  [facility] [address] [place], [place] [address] [phone]

## 2020-10-08 DIAGNOSIS — K219 Gastro-esophageal reflux disease without esophagitis: Secondary | ICD-10-CM | POA: Insufficient documentation

## 2020-12-14 ENCOUNTER — Inpatient Hospital Stay: Payer: Medicare HMO | Attending: Radiation Oncology

## 2020-12-14 DIAGNOSIS — C61 Malignant neoplasm of prostate: Secondary | ICD-10-CM | POA: Insufficient documentation

## 2020-12-14 LAB — PSA: Prostatic Specific Antigen: 0.19 ng/mL (ref 0.00–4.00)

## 2020-12-20 ENCOUNTER — Encounter: Payer: Self-pay | Admitting: Radiation Oncology

## 2020-12-21 ENCOUNTER — Ambulatory Visit
Admission: RE | Admit: 2020-12-21 | Discharge: 2020-12-21 | Disposition: A | Discharge status: Home / Self Care | Payer: Medicare HMO | Source: Ambulatory Visit | Attending: Radiation Oncology | Admitting: Radiation Oncology

## 2020-12-21 ENCOUNTER — Encounter: Payer: Self-pay | Admitting: Radiation Oncology

## 2020-12-21 VITALS — BP 126/73 | HR 82 | Temp 97.4°F | Wt 172.0 lb

## 2020-12-21 DIAGNOSIS — C61 Malignant neoplasm of prostate: Secondary | ICD-10-CM

## 2020-12-21 NOTE — Progress Notes (Signed)
Radiation Oncology Follow up Note  Name: Andre Jackson   Date:   12/21/2020 MRN:  [id number] DOB: [birth year]    This 75 y.o. male presents to the clinic today for 2-year follow-up status post IMRT radiation therapy for stage IIa adenocarcinoma the prostate.  REFERRING PROVIDER: Baxter Hire, MD  HPI: Patient is a 75 year old male now out 2 years having completed IMRT radiation therapy for Gleason 7 adenocarcinoma the prostate presenting with a PSA of 4.7.  He is seen today in routine follow-up and is doing well.  He specifically denies any increased lower urinary tract symptoms diarrhea or fatigue.Marland Kitchen  His most recent PSA is 0.19 down from 1.08 a year ago.  COMPLICATIONS OF TREATMENT: none  FOLLOW UP COMPLIANCE: keeps appointments   PHYSICAL EXAM:  BP 126/73   Pulse 82   Temp (!) 97.4 F (36.3 C) (Tympanic)   Wt 172 lb (78 kg)   BMI 26.15 kg/m  Well-developed well-nourished patient in NAD. HEENT reveals PERLA, EOMI, discs not visualized.  Oral cavity is clear. No oral mucosal lesions are identified. Neck is clear without evidence of cervical or supraclavicular adenopathy. Lungs are clear to A&P. Cardiac examination is essentially unremarkable with regular rate and rhythm without murmur rub or thrill. Abdomen is benign with no organomegaly or masses noted. Motor sensory and DTR levels are equal and symmetric in the upper and lower extremities. Cranial nerves II through XII are grossly intact. Proprioception is intact. No peripheral adenopathy or edema is identified. No motor or sensory levels are noted. Crude visual fields are within normal range.  RADIOLOGY RESULTS: No current films for review  PLAN: Present time patient is under excellent biochemical control of his prostate cancer and pleased with his overall progress.  His extremely low side effect profile.  Patient knows to call at anytime with any concerns.  I would like to take this opportunity to thank you for allowing me to  participate in the care of your patient.Noreene Filbert, MD

## 2021-01-02 ENCOUNTER — Other Ambulatory Visit: Payer: Medicare HMO

## 2021-02-14 IMAGING — RF OPERATIVE RIGHT HIP WITH PELVIS
1 series · 4 of 4 positions shown · non-contrast
Comparison: None.

CLINICAL DATA: Intraoperative fluoroscopy for right hip
arthroplasty.

EXAM:
OPERATIVE RIGHT HIP (WITH PELVIS IF PERFORMED) 4 VIEWS
TECHNIQUE: Fluoroscopic spot image(s) were submitted for interpretation
post-operatively.

[Series 1: unknown protocol · 0.20mm/px · 4 of 4 slices shown]
[im 1/4]
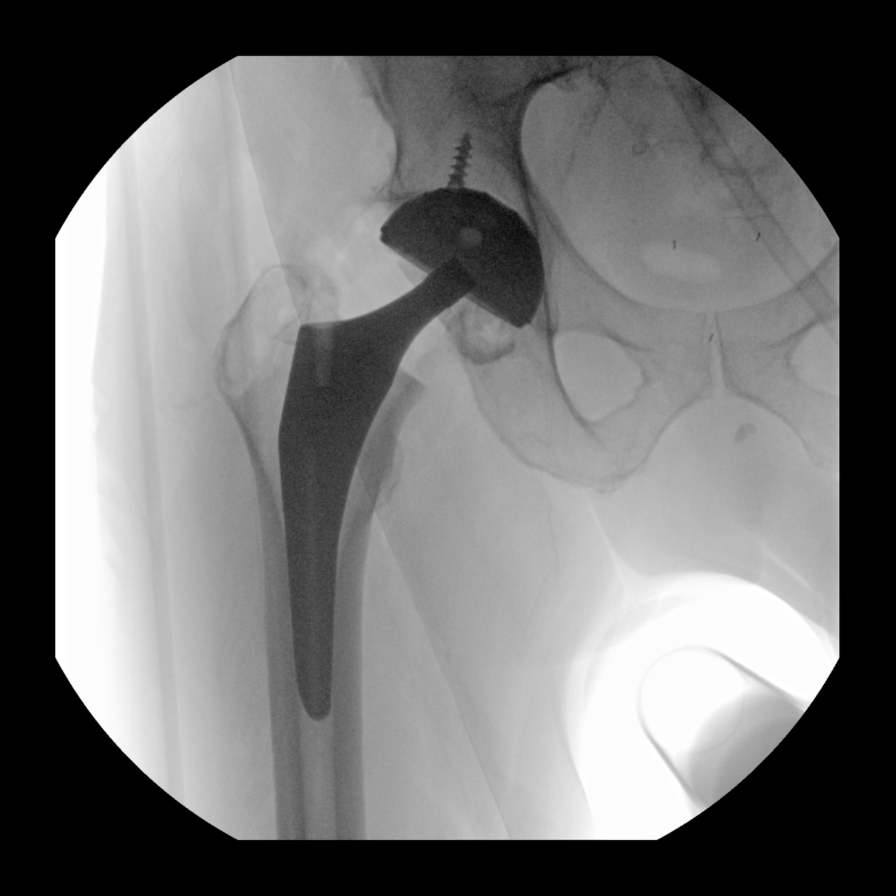
[im 2/4]
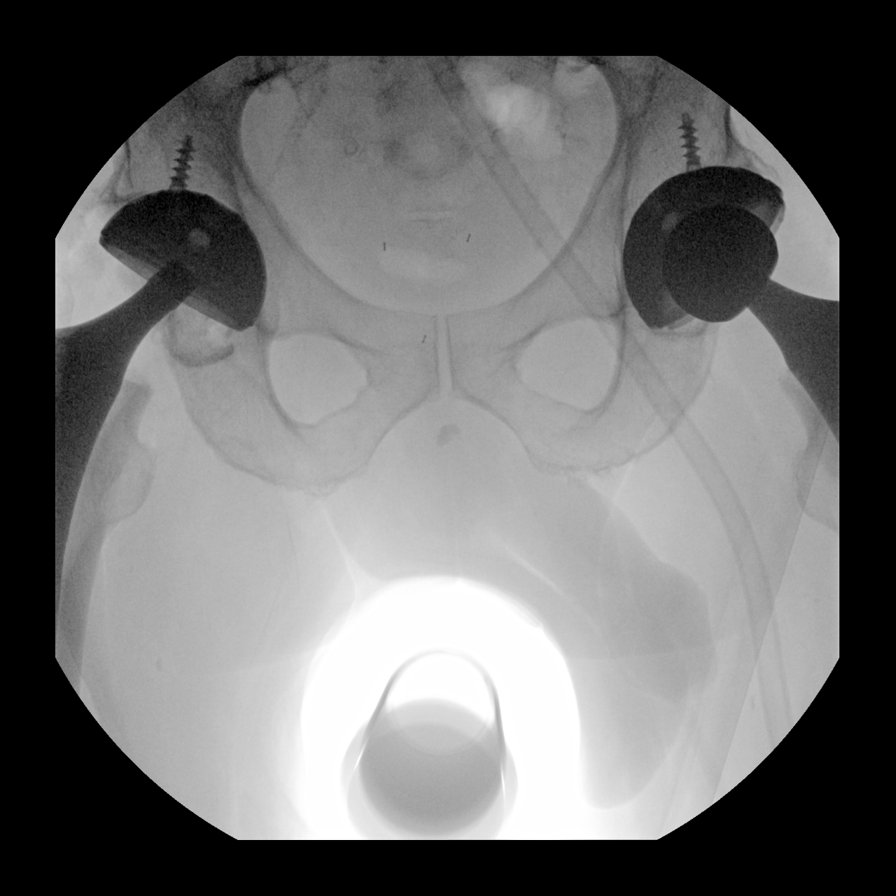
[im 3/4]
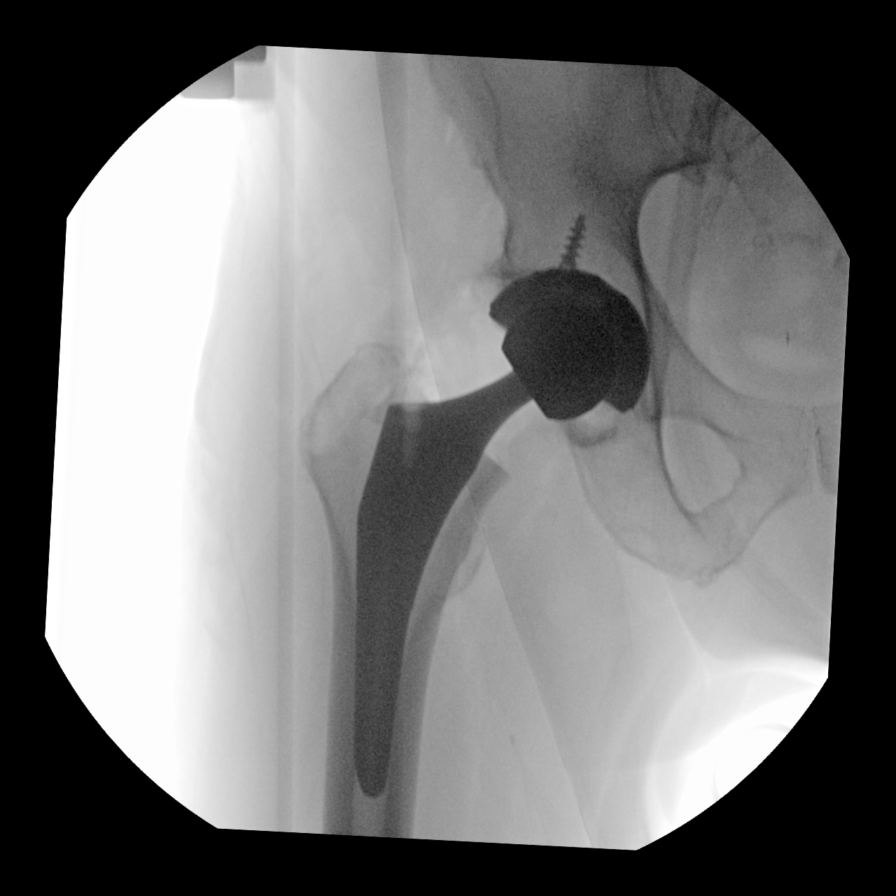
[im 4/4]
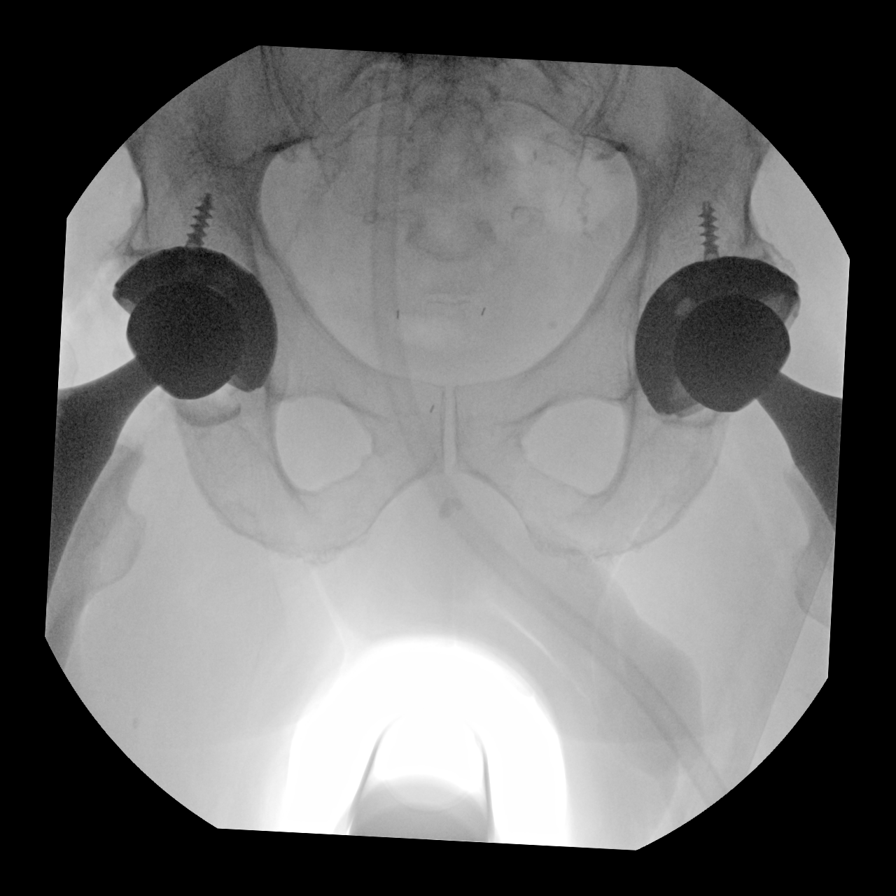

[4 of 4 positions shown; findings below may reference images not displayed]

FINDINGS: Submitted images show placement of femoral and acetabular components
of a total right hip arthroplasty. The components are well seated
and well aligned. No evidence an operative complication.
IMPRESSION: Intraoperative imaging for total right hip arthroplasty. Prosthetic
components are well positioned.

## 2021-04-23 ENCOUNTER — Other Ambulatory Visit: Payer: Self-pay

## 2021-04-23 ENCOUNTER — Inpatient Hospital Stay: Payer: Medicare HMO | Attending: Oncology | Admitting: Oncology

## 2021-04-23 ENCOUNTER — Inpatient Hospital Stay: Payer: Medicare HMO

## 2021-04-23 ENCOUNTER — Encounter: Payer: Self-pay | Admitting: Oncology

## 2021-04-23 VITALS — BP 127/81 | HR 74 | Temp 98.7°F | Resp 18 | Wt 169.6 lb

## 2021-04-23 DIAGNOSIS — Z8546 Personal history of malignant neoplasm of prostate: Secondary | ICD-10-CM | POA: Diagnosis not present

## 2021-04-23 DIAGNOSIS — E785 Hyperlipidemia, unspecified: Secondary | ICD-10-CM | POA: Insufficient documentation

## 2021-04-23 DIAGNOSIS — D72820 Lymphocytosis (symptomatic): Secondary | ICD-10-CM

## 2021-04-23 DIAGNOSIS — I10 Essential (primary) hypertension: Secondary | ICD-10-CM | POA: Diagnosis not present

## 2021-04-23 DIAGNOSIS — M1712 Unilateral primary osteoarthritis, left knee: Secondary | ICD-10-CM | POA: Insufficient documentation

## 2021-04-23 LAB — CBC WITH DIFFERENTIAL/PLATELET
Abs Immature Granulocytes: 0.02 10*3/uL (ref 0.00–0.07)
Basophils Absolute: 0.1 10*3/uL (ref 0.0–0.1)
Basophils Relative: 0 %
Eosinophils Absolute: 0.2 10*3/uL (ref 0.0–0.5)
Eosinophils Relative: 1 %
HCT: 42.8 % (ref 39.0–52.0)
Hemoglobin: 13.8 g/dL (ref 13.0–17.0)
Immature Granulocytes: 0 %
Lymphocytes Relative: 71 %
Lymphs Abs: 8.3 10*3/uL — ABNORMAL HIGH (ref 0.7–4.0)
MCH: 29.2 pg (ref 26.0–34.0)
MCHC: 32.2 g/dL (ref 30.0–36.0)
MCV: 90.5 fL (ref 80.0–100.0)
Monocytes Absolute: 0.6 10*3/uL (ref 0.1–1.0)
Monocytes Relative: 6 %
Neutro Abs: 2.5 10*3/uL (ref 1.7–7.7)
Neutrophils Relative %: 22 %
Platelets: 211 10*3/uL (ref 150–400)
RBC: 4.73 MIL/uL (ref 4.22–5.81)
RDW: 13.2 % (ref 11.5–15.5)
Smear Review: NORMAL
WBC: 11.7 10*3/uL — ABNORMAL HIGH (ref 4.0–10.5)
nRBC: 0 % (ref 0.0–0.2)

## 2021-04-23 NOTE — Progress Notes (Signed)
Hematology/Oncology Consult note [facility] [phone] [phone]  Patient Care Team: Baxter Hire, MD as PCP - General (Internal Medicine)   Name of the patient: Andre Jackson  [id number]  28-Jun-1946    Reason for referral-lymphocytosis   Referring physician-Dr. Edwina Barth  Date of visit: 04/23/21   History of presenting illness-patient is a 75 year old African-American male with a past medical history significant for hypertension hyperlipidemia osteoarthritis and prostate cancer in the past.  He has been referred to [place] for lymphocytosis/leukocytosis.His most recent CBC from 04/01/2021 showed a white count of 12.6, H&H of 13.5/41.9 with an MCV of 90.5 and a platelet count of 199.  Looking back at his CBC is over the last 5 years his white count has mostly remained normal between 7-9 but differential has shown some intermittent lymphocytosis in the past as well.  No significant anemia or thrombocytopenia noted in the past.  Patient is here with his wife today and reports doing well overall.  Appetite and weight have remained stable.  Denies any drenching night sweats or lumps or bumps anywhere.  ECOG PS- 1  Pain scale- 0   Review of systems- Review of Systems  Constitutional:  Negative for chills, fever, malaise/fatigue and weight loss.  HENT:  Negative for congestion, ear discharge and nosebleeds.   Eyes:  Negative for blurred vision.  Respiratory:  Negative for cough, hemoptysis, sputum production, shortness of breath and wheezing.   Cardiovascular:  Negative for chest pain, palpitations, orthopnea and claudication.  Gastrointestinal:  Negative for abdominal pain, blood in stool, constipation, diarrhea, heartburn, melena, nausea and vomiting.  Genitourinary:  Negative for dysuria, flank pain, frequency, hematuria and urgency.  Musculoskeletal:  Negative for back pain, joint pain and myalgias.  Skin:  Negative for rash.   Neurological:  Negative for dizziness, tingling, focal weakness, seizures, weakness and headaches.  Endo/Heme/Allergies:  Does not bruise/bleed easily.  Psychiatric/Behavioral:  Negative for depression and suicidal ideas. The patient does not have insomnia.    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/2012   Hyperlipidemia    Joint pain    Joint swelling    Seasonal allergies    Diverticulosis    Hx of colonic polyps    S/P TKR (total knee replacement)    Hypertension    Arthritis      Past Medical History:  Diagnosis Date   Arthritis    "knees, hips" (04/03/2015)   Cataract immature    Diverticulosis    ED (erectile dysfunction)    History of kidney stones 2016   Hx of colonic polyps    Hyperlipidemia    takes Lipitor daily   Hypertension    takes Prinizide daily   Joint pain    Joint swelling    Left knee DJD    OA (osteoarthritis)    Prostate cancer ([facility])    Seasonal allergies    takes Allegra daily     Past Surgical History:  Procedure Laterality Date   BACK SURGERY  COLONOSCOPY W/ BIOPSIES AND POLYPECTOMY  07/2011   "have had it once before that also"   I & D EXTREMITY Left ~ 2010   knee   JOINT REPLACEMENT     KNEE ARTHROSCOPY Left ~ 2011   LIPOMA EXCISION  07/2011   neck   LUMBAR DISC SURGERY  1970's   MULTIPLE TOOTH EXTRACTIONS  ~ 2007   REPLACEMENT TOTAL KNEE Right 04/05/12   REVERSE SHOULDER ARTHROPLASTY Left 12/02/2017    Procedure: REVERSE SHOULDER ARTHROPLASTY;  Surgeon: Hiram Gash, MD;  Location: [place];  Service: Orthopedics;  Laterality: Left;   TONSILLECTOMY AND ADENOIDECTOMY  1940's   TOTAL HIP ARTHROPLASTY Left 04/03/2015   TOTAL HIP ARTHROPLASTY Left 04/03/2015   Procedure: TOTAL HIP ARTHROPLASTY ANTERIOR APPROACH;  Surgeon: Renette Butters, MD;  Location: [place];  Service: Orthopedics;  Laterality: Left;   TOTAL HIP ARTHROPLASTY Right 05/10/2019   Procedure: TOTAL HIP ARTHROPLASTY ANTERIOR APPROACH;  Surgeon: Renette Butters, MD;  Location: [facility];  Service: Orthopedics;  Laterality: Right;   TOTAL KNEE ARTHROPLASTY  04/05/2012   Procedure: TOTAL KNEE ARTHROPLASTY;  Surgeon: Lorn Junes, MD;  Location: [place];  Service: Orthopedics;  Laterality: Right;  DR Noemi Chapel WANTS 90 MINUTES FOR THIS CASE   TOTAL KNEE ARTHROPLASTY  08/02/2012   Procedure: TOTAL KNEE ARTHROPLASTY;  Surgeon: Lorn Junes, MD;  Location: [place];  Service: Orthopedics;  Laterality: Left;    Social History   Socioeconomic History   Marital status: Married    Spouse name: Not on file   Number of children: Not on file   Years of education: Not on file   Highest education level: Not on file  Occupational History   Occupation: retired  Tobacco Use   Smoking status: Former    Packs/day: 0.33    Years: 44.00    Pack years: 14.52    Types: Cigarettes    Quit date: 02/10/2009    Years since quitting: 12.2   Smokeless tobacco: Never  Vaping Use   Vaping Use: Never used  Substance and Sexual Activity   Alcohol use: Yes    Comment: occasional   Drug use: No   Sexual activity: Yes  Other Topics Concern   Not on file  Social History Narrative   Not on file   Social Determinants of Health   Financial Resource Strain: Not on file  Food Insecurity: Not on file  Transportation Needs: Not on file  Physical Activity: Not on file  Stress: Not on file  Social Connections: Not on file  Intimate Partner Violence: Not on file      Family History  Problem Relation Age of Onset   Cerebral aneurysm Mother    Hypertension Mother    Hypertension Sister    Hypertension Brother    Prostate cancer Neg Hx      Current Outpatient Medications:    acetaminophen (TYLENOL) 500 MG tablet, Take 500 mg by mouth daily as needed for moderate pain., Disp: , Rfl:    aspirin EC 81 MG tablet, Take 1 tablet (81 mg total) by mouth 2 (two) times daily. For DVT prophylaxis for 30 days after surgery., Disp: 60 tablet, Rfl: 0   atorvastatin (LIPITOR) 10 MG tablet, Take 10 mg by mouth daily., Disp: , Rfl:    fluticasone (FLONASE) 50 MCG/ACT nasal spray, Place 2 sprays into the nose daily as needed for allergies. , Disp: , Rfl:    lisinopril-hydrochlorothiazide (PRINZIDE,ZESTORETIC) 20-25 MG per tablet, Take  1 tablet by mouth daily., Disp: , Rfl:    meloxicam (MOBIC) 15 MG tablet, Take 15 mg by mouth daily., Disp: , Rfl:    Multiple Vitamin (MULTIVITAMIN WITH MINERALS) TABS tablet, Take 1 tablet by mouth daily., Disp: , Rfl:    pantoprazole (PROTONIX) 40 MG tablet, Take 40 mg by mouth daily., Disp: , Rfl:    erythromycin ophthalmic ointment, SMARTSIG:In Eye(s) (Patient not taking: Reported on 04/23/2021), Disp: , Rfl:    sildenafil (REVATIO) 20 MG tablet, Take 1-3 tablets (20-60 mg total) by mouth as needed. (Patient not taking: Reported on 04/23/2021), Disp: 30 tablet, Rfl: 11   Physical exam:  Vitals:   04/23/21 1059  BP: 127/81  Pulse: 74  Resp: 18  Temp: 98.7 F (37.1 C)  TempSrc: Tympanic  SpO2: 100%  Weight: 169 lb 9 oz (76.9 kg)   Physical Exam Cardiovascular:     Rate and Rhythm: Normal rate and regular rhythm.     Heart sounds: Normal heart sounds.  Pulmonary:     Effort: Pulmonary effort is normal.     Breath sounds: Normal breath sounds.  Abdominal:     General: Bowel sounds are normal.     Palpations: Abdomen is soft.     Comments: No palpable hepatosplenomegaly  Lymphadenopathy:     Comments: No palpable  cervical, supraclavicular, axillary or inguinal adenopathy    Skin:    General: Skin is warm and dry.  Neurological:     Mental Status: He is alert and oriented to person, place, and time.       CMP Latest Ref Rng & Units 05/03/2019  Glucose 70 - 99 mg/dL 120(H)  BUN 8 - 23 mg/dL 29(H)  Creatinine 0.61 - 1.24 mg/dL 1.41(H)  Sodium 135 - 145 mmol/L 138  Potassium 3.5 - 5.1 mmol/L 4.0  Chloride 98 - 111 mmol/L 102  CO2 22 - 32 mmol/L 27  Calcium 8.9 - 10.3 mg/dL 9.4  Total Protein 6.0 - 8.3 g/dL -  Total Bilirubin 0.3 - 1.2 mg/dL -  Alkaline Phos 39 - 117 U/L -  AST 0 - 37 U/L -  ALT 0 - 53 U/L -   CBC Latest Ref Rng & Units 04/23/2021  WBC 4.0 - 10.5 K/uL 11.7(H)  Hemoglobin 13.0 - 17.0 g/dL 13.8  Hematocrit 39.0 - 52.0 % 42.8  Platelets 150 - 400 K/uL 211    Assessment and plan- Patient is a 75 y.o. male referred for lymphocytosis  Discussed with the patient differences between reactive versus clonal lymphocytosis.  Patient's white cell count has been historically normal on the differential has shown intermittent lymphocytosis.  Plan to check CBC with differential and peripheral flow cytometry today.  Video visit with the patient in 3 to 4 weeks time.  Even if flow cytometry shows clonal lymphocytosis consistent with monoclonal B-cell lymphocytosis or CLL he does not require any treatment at this time since he does not have any evidence of B symptoms or other cytopenias   Thank you for this kind referral and the opportunity to participate in the care of this patient   Visit Diagnosis 1. Lymphocytosis     Dr. Randa Evens, MD, MPH [facility] [phone] 04/23/2021

## 2021-04-23 NOTE — Progress Notes (Signed)
New pt visit for lymphocytosis. Referred by PCP, Dr. Edwina Barth.

## 2021-04-25 LAB — COMP PANEL: LEUKEMIA/LYMPHOMA: Immunophenotypic Profile: 55

## 2021-05-17 ENCOUNTER — Encounter: Payer: Self-pay | Admitting: Oncology

## 2021-05-17 ENCOUNTER — Other Ambulatory Visit: Payer: Self-pay

## 2021-05-17 ENCOUNTER — Inpatient Hospital Stay: Payer: Medicare HMO | Attending: Oncology | Admitting: Oncology

## 2021-05-17 VITALS — BP 113/70 | HR 82 | Temp 97.6°F | Resp 18 | Wt 165.6 lb

## 2021-05-17 DIAGNOSIS — C9110 Chronic lymphocytic leukemia of B-cell type not having achieved remission: Secondary | ICD-10-CM | POA: Diagnosis present

## 2021-05-17 DIAGNOSIS — Z8546 Personal history of malignant neoplasm of prostate: Secondary | ICD-10-CM | POA: Insufficient documentation

## 2021-05-17 DIAGNOSIS — I10 Essential (primary) hypertension: Secondary | ICD-10-CM | POA: Insufficient documentation

## 2021-05-17 DIAGNOSIS — E785 Hyperlipidemia, unspecified: Secondary | ICD-10-CM | POA: Insufficient documentation

## 2021-05-17 NOTE — Progress Notes (Signed)
Hematology/Oncology Consult note [facility]  Telephone:([phone] Fax:([phone]  Patient Care Team: Baxter Hire, MD as PCP - General (Internal Medicine)   Name of the patient: Andre Jackson  [id number]  05-23-1946   Date of visit: 05/17/21  Diagnosis-CLL/atypical CLL/SLL  Chief complaint/ Reason for visit-discuss results of blood work and further management  Heme/Onc history: patient is a 75 year old African-American male with a past medical history significant for hypertension hyperlipidemia osteoarthritis and prostate cancer in the past.  He has been referred to [place] for lymphocytosis/leukocytosis.His most recent CBC from 04/01/2021 showed a white count of 12.6, H&H of 13.5/41.9 with an MCV of 90.5 and a platelet count of 199.  Looking back at his CBC is over the last 5 years his white count has mostly remained normal between 7-9 but differential has shown some intermittent lymphocytosis in the past as well.    Results of blood work from 04/23/2021 showed a mildly elevated white count of 11.7 with an absolute lymphocyte count of 8.3.  Flow cytometry showed CD5 positive CD23 positive and FMC CD7 weakly positive B-cell lymphoproliferative disorder cells were dim positive for CD20 and FMC7.  CD103 negative.  Findings consistent with atypical CLL/SLL.  Mantle cell lymphoma not ruled out.  Interval history-patient reports some symptoms of bloating and gas but denies other complaints at this time.  ECOG PS- 1 Pain scale- 0  Review of systems- Review of Systems  Constitutional:  Negative for chills, fever, malaise/fatigue and weight loss.  HENT:  Negative for congestion, ear discharge and nosebleeds.   Eyes:  Negative for blurred vision.  Respiratory:  Negative for cough, hemoptysis, sputum production, shortness of breath and wheezing.   Cardiovascular:  Negative for chest pain, palpitations, orthopnea and claudication.  Gastrointestinal:  Negative for  abdominal pain, blood in stool, constipation, diarrhea, heartburn, melena, nausea and vomiting.       Abdominal bloating  Genitourinary:  Negative for dysuria, flank pain, frequency, hematuria and urgency.  Musculoskeletal:  Negative for back pain, joint pain and myalgias.  Skin:  Negative for rash.  Neurological:  Negative for dizziness, tingling, focal weakness, seizures, weakness and headaches.  Endo/Heme/Allergies:  Does not bruise/bleed easily.  Psychiatric/Behavioral:  Negative for depression and suicidal ideas. The patient does not have insomnia.     No Known Allergies   Past Medical History:  Diagnosis Date   Arthritis    "knees, hips" (04/03/2015)   Cataract immature    Diverticulosis    ED (erectile dysfunction)    History of kidney stones 2016   Hx of colonic polyps    Hyperlipidemia    takes Lipitor daily   Hypertension    takes Prinizide daily   Joint pain    Joint swelling    Left knee DJD    OA (osteoarthritis)    Prostate cancer ([place])    Seasonal allergies    takes Allegra daily     Past Surgical History:  Procedure Laterality Date   BACK SURGERY     COLONOSCOPY W/ BIOPSIES AND POLYPECTOMY  07/2011   "have had it once before that also"   I & D EXTREMITY Left ~ 2010   knee   JOINT REPLACEMENT     KNEE ARTHROSCOPY Left ~ 2011   LIPOMA EXCISION  07/2011   neck   LUMBAR DISC SURGERY  1970's   MULTIPLE TOOTH EXTRACTIONS  ~ 2007   REPLACEMENT TOTAL KNEE Right 04/05/12   REVERSE SHOULDER ARTHROPLASTY Left  12/02/2017   Procedure: REVERSE SHOULDER ARTHROPLASTY;  Surgeon: Hiram Gash, MD;  Location: [place];  Service: Orthopedics;  Laterality: Left;   TONSILLECTOMY AND ADENOIDECTOMY  1940's   TOTAL HIP ARTHROPLASTY Left 04/03/2015   TOTAL HIP ARTHROPLASTY Left 04/03/2015   Procedure: TOTAL HIP ARTHROPLASTY ANTERIOR APPROACH;  Surgeon: Renette Butters, MD;  Location: [place];  Service: Orthopedics;  Laterality: Left;   TOTAL HIP ARTHROPLASTY Right 05/10/2019    Procedure: TOTAL HIP ARTHROPLASTY ANTERIOR APPROACH;  Surgeon: Renette Butters, MD;  [facility];  Service: Orthopedics;  Laterality: Right;   TOTAL KNEE ARTHROPLASTY  04/05/2012   Procedure: TOTAL KNEE ARTHROPLASTY;  Surgeon: Lorn Junes, MD;  Location: [place];  Service: Orthopedics;  Laterality: Right;  DR Noemi Chapel WANTS 90 MINUTES FOR THIS CASE   TOTAL KNEE ARTHROPLASTY  08/02/2012   Procedure: TOTAL KNEE ARTHROPLASTY;  Surgeon: Lorn Junes, MD;  Location: [place];  Service: Orthopedics;  Laterality: Left;    Social History   Socioeconomic History   Marital status: Married    Spouse name: Not on file   Number of children: Not on file   Years of education: Not on file   Highest education level: Not on file  Occupational History   Occupation: retired  Tobacco Use   Smoking status: Former    Packs/day: 0.33    Years: 44.00    Pack years: 14.52    Types: Cigarettes    Quit date: 02/10/2009    Years since quitting: 12.2   Smokeless tobacco: Never  Vaping Use   Vaping Use: Never used  Substance and Sexual Activity   Alcohol use: Yes    Comment: occasional   Drug use: No   Sexual activity: Yes  Other Topics Concern   Not on file  Social History Narrative   Not on file   Social Determinants of Health   Financial Resource Strain: Not on file  Food Insecurity: Not on file  Transportation Needs: Not on file  Physical Activity: Not on file  Stress: Not on file  Social Connections: Not on file  Intimate Partner Violence: Not on file    Family History  Problem Relation Age of Onset   Cerebral aneurysm Mother    Hypertension Mother    Hypertension Sister    Hypertension Brother    Prostate cancer Neg Hx      Current Outpatient Medications:    acetaminophen (TYLENOL) 500 MG tablet, Take 500 mg by mouth daily as needed for moderate pain., Disp: , Rfl:    aspirin EC 81 MG tablet, Take 1 tablet (81 mg total) by mouth 2 (two) times daily. For DVT prophylaxis for 30  days after surgery., Disp: 60 tablet, Rfl: 0   atorvastatin (LIPITOR) 10 MG tablet, Take 10 mg by mouth daily., Disp: , Rfl:    fluticasone (FLONASE) 50 MCG/ACT nasal spray, Place 2 sprays into the nose daily as needed for allergies. , Disp: , Rfl:    lisinopril-hydrochlorothiazide (PRINZIDE,ZESTORETIC) 20-25 MG per tablet, Take 1 tablet by mouth daily., Disp: , Rfl:    meloxicam (MOBIC) 15 MG tablet, Take 15 mg by mouth daily., Disp: , Rfl:    Multiple Vitamin (MULTIVITAMIN WITH MINERALS) TABS tablet, Take 1 tablet by mouth daily., Disp: , Rfl:    pantoprazole (PROTONIX) 40 MG tablet, Take 40 mg by mouth daily., Disp: , Rfl:    erythromycin ophthalmic ointment, SMARTSIG:In Eye(s) (Patient not taking: No sig reported), Disp: , Rfl:  sildenafil (REVATIO) 20 MG tablet, Take 1-3 tablets (20-60 mg total) by mouth as needed. (Patient not taking: No sig reported), Disp: 30 tablet, Rfl: 11  Physical exam:  Vitals:   05/17/21 1003  BP: 113/70  Pulse: 82  Resp: 18  Temp: 97.6 F (36.4 C)  TempSrc: Tympanic  SpO2: 99%  Weight: 165 lb 9.6 oz (75.1 kg)   Physical Exam Constitutional:      General: He is not in acute distress. Cardiovascular:     Rate and Rhythm: Normal rate and regular rhythm.     Heart sounds: Normal heart sounds.  Pulmonary:     Effort: Pulmonary effort is normal.  Abdominal:     General: Bowel sounds are normal.     Palpations: Abdomen is soft.  Skin:    General: Skin is warm and dry.  Neurological:     Mental Status: He is alert and oriented to person, place, and time.     CMP Latest Ref Rng & Units 05/03/2019  Glucose 70 - 99 mg/dL 120(H)  BUN 8 - 23 mg/dL 29(H)  Creatinine 0.61 - 1.24 mg/dL 1.41(H)  Sodium 135 - 145 mmol/L 138  Potassium 3.5 - 5.1 mmol/L 4.0  Chloride 98 - 111 mmol/L 102  CO2 22 - 32 mmol/L 27  Calcium 8.9 - 10.3 mg/dL 9.4  Total Protein 6.0 - 8.3 g/dL -  Total Bilirubin 0.3 - 1.2 mg/dL -  Alkaline Phos 39 - 117 U/L -  AST 0 - 37 U/L -   ALT 0 - 53 U/L -   CBC Latest Ref Rng & Units 04/23/2021  WBC 4.0 - 10.5 K/uL 11.7(H)  Hemoglobin 13.0 - 17.0 g/dL 13.8  Hematocrit 39.0 - 52.0 % 42.8  Platelets 150 - 400 K/uL 211     Assessment and plan- Patient is a 75 y.o. male referred for lymphocytosis consistent with CLL/atypical CLL  Results of flow cytometry showed clonal B-cell population positive for CD5, negative for CD10And positive for CD23.  Cells were dim positive for FMC7 and CD20 which would be more consistent with CLL.  I will obtain a FISH CLL panel which we will also check CCN D1 to rule out mantle cell lymphoma in 3 months.  Patient has isolated lymphocytosis in the absence of other cytopenias or B symptoms which does not require any treatment at this time.  This would constitute Rai stage 0 CLL repeat CBC with differential in 3 months in 6 months and I will see him back in 6 months  Cancer Staging CLL (chronic lymphocytic leukemia) ([place]) Staging form: Chronic Lymphocytic Leukemia / Small Lymphocytic Lymphoma, AJCC 8th Edition - Clinical stage from 05/17/2021: Modified Rai Stage 0 (Modified Rai risk: Low, Binet: Stage A, Lymphocytosis: Present, Adenopathy: Absent, Organomegaly: Absent, Anemia: Absent, Thrombocytopenia: Absent) - Signed by Sindy Guadeloupe, MD on 05/17/2021 Stage prefix: Initial diagnosis    Visit Diagnosis 1. CLL (chronic lymphocytic leukemia) ([place])      Dr. Randa Evens, MD, MPH [facility] [id number] 05/17/2021 4:46 PM

## 2021-05-28 ENCOUNTER — Other Ambulatory Visit: Payer: Self-pay

## 2021-05-28 ENCOUNTER — Other Ambulatory Visit: Payer: Medicare HMO

## 2021-05-28 DIAGNOSIS — R972 Elevated prostate specific antigen [PSA]: Secondary | ICD-10-CM

## 2021-05-29 LAB — PSA: Prostate Specific Ag, Serum: 0.2 ng/mL (ref 0.0–4.0)

## 2021-05-30 ENCOUNTER — Ambulatory Visit: Payer: Medicare HMO | Admitting: Urology

## 2021-05-30 ENCOUNTER — Encounter: Payer: Self-pay | Admitting: Urology

## 2021-05-30 ENCOUNTER — Other Ambulatory Visit: Payer: Self-pay

## 2021-05-30 ENCOUNTER — Ambulatory Visit: Payer: Self-pay | Admitting: Urology

## 2021-05-30 VITALS — BP 145/86 | HR 102 | Ht 68.0 in | Wt 167.5 lb

## 2021-05-30 DIAGNOSIS — R972 Elevated prostate specific antigen [PSA]: Secondary | ICD-10-CM

## 2021-05-30 DIAGNOSIS — C61 Malignant neoplasm of prostate: Secondary | ICD-10-CM

## 2021-05-30 DIAGNOSIS — N529 Male erectile dysfunction, unspecified: Secondary | ICD-10-CM

## 2021-05-30 MED ORDER — SILDENAFIL CITRATE 20 MG PO TABS: 20.0000 mg | ORAL_TABLET | ORAL | 11 refills | Status: DC | PRN

## 2021-05-30 NOTE — Progress Notes (Signed)
   05/30/2021 1:23 PM   Andre Jackson 1945-11-05 [id number]   Reason for visit: Follow up prostate cancer and erectile dysfunction   HPI: I saw [name] back in urology clinic for follow-up of prostate cancer and erectile dysfunction.  Briefly he is a 75 year old African-American male with a family history of non-lethal prostate cancer who ultimately was diagnosed with favorable intermediate risk prostate cancer with PSA of 4.7, and 8/12 cores positive for Gleason score 3+4=7 with max core involvement greater than 90%.    He completed external beam radiation therapy with Dr. Baruch Gouty in spring 2020.  He continues to do extremely well since and denies any urinary significant urinary symptoms.   He has erectile dysfunction that is well controlled with 2 to 3 tablets of Revatio on demand and he continues to do well with this medication as needed.  PSA remains very low and stable, 0.2 this year from 0.3 last year, indicating excellent control of his prostate cancer.  We discussed the need for ongoing surveillance and the risk of recurrence, as well as the risks, benefits, and side effects of sildenafil.   -RTC 1 year with PSA prior -Sildenafil refilled, risks and benefits discussed  Billey Co, MD  [place] [address] [place], [place] [address] [phone]

## 2021-08-16 ENCOUNTER — Inpatient Hospital Stay: Payer: Medicare HMO | Attending: Oncology

## 2021-08-16 ENCOUNTER — Other Ambulatory Visit: Payer: Self-pay

## 2021-08-16 DIAGNOSIS — Z8546 Personal history of malignant neoplasm of prostate: Secondary | ICD-10-CM | POA: Diagnosis not present

## 2021-08-16 DIAGNOSIS — C61 Malignant neoplasm of prostate: Secondary | ICD-10-CM

## 2021-08-16 DIAGNOSIS — C9110 Chronic lymphocytic leukemia of B-cell type not having achieved remission: Secondary | ICD-10-CM | POA: Diagnosis not present

## 2021-08-16 LAB — CBC WITH DIFFERENTIAL/PLATELET
Abs Immature Granulocytes: 0.01 10*3/uL (ref 0.00–0.07)
Basophils Absolute: 0.1 10*3/uL (ref 0.0–0.1)
Basophils Relative: 1 %
Eosinophils Absolute: 0.2 10*3/uL (ref 0.0–0.5)
Eosinophils Relative: 1 %
HCT: 43.9 % (ref 39.0–52.0)
Hemoglobin: 14.3 g/dL (ref 13.0–17.0)
Immature Granulocytes: 0 %
Lymphocytes Relative: 74 %
Lymphs Abs: 12.3 10*3/uL — ABNORMAL HIGH (ref 0.7–4.0)
MCH: 28.7 pg (ref 26.0–34.0)
MCHC: 32.6 g/dL (ref 30.0–36.0)
MCV: 88 fL (ref 80.0–100.0)
Monocytes Absolute: 1.3 10*3/uL — ABNORMAL HIGH (ref 0.1–1.0)
Monocytes Relative: 8 %
Neutro Abs: 2.6 10*3/uL (ref 1.7–7.7)
Neutrophils Relative %: 16 %
Platelets: 216 10*3/uL (ref 150–400)
RBC: 4.99 MIL/uL (ref 4.22–5.81)
RDW: 13.4 % (ref 11.5–15.5)
Smear Review: NORMAL
WBC: 16.5 10*3/uL — ABNORMAL HIGH (ref 4.0–10.5)
nRBC: 0.1 % (ref 0.0–0.2)

## 2021-08-16 LAB — PSA: Prostatic Specific Antigen: 0.13 ng/mL (ref 0.00–4.00)

## 2021-08-23 LAB — FISH HES LEUKEMIA, 4Q12 REA

## 2021-11-18 ENCOUNTER — Other Ambulatory Visit: Payer: Self-pay

## 2021-11-18 ENCOUNTER — Inpatient Hospital Stay: Payer: Medicare Other | Attending: Oncology

## 2021-11-18 ENCOUNTER — Encounter: Payer: Self-pay | Admitting: Oncology

## 2021-11-18 ENCOUNTER — Inpatient Hospital Stay: Payer: Medicare Other | Admitting: Oncology

## 2021-11-18 VITALS — BP 132/79 | HR 91 | Temp 97.6°F | Resp 16 | Ht 68.0 in | Wt 173.8 lb

## 2021-11-18 DIAGNOSIS — C9110 Chronic lymphocytic leukemia of B-cell type not having achieved remission: Secondary | ICD-10-CM | POA: Diagnosis present

## 2021-11-18 DIAGNOSIS — E785 Hyperlipidemia, unspecified: Secondary | ICD-10-CM | POA: Diagnosis not present

## 2021-11-18 DIAGNOSIS — I10 Essential (primary) hypertension: Secondary | ICD-10-CM | POA: Diagnosis not present

## 2021-11-18 LAB — CBC WITH DIFFERENTIAL/PLATELET
Abs Immature Granulocytes: 0.04 10*3/uL (ref 0.00–0.07)
Basophils Absolute: 0.1 10*3/uL (ref 0.0–0.1)
Basophils Relative: 0 %
Eosinophils Absolute: 0.2 10*3/uL (ref 0.0–0.5)
Eosinophils Relative: 1 %
HCT: 44.4 % (ref 39.0–52.0)
Hemoglobin: 14.4 g/dL (ref 13.0–17.0)
Immature Granulocytes: 0 %
Lymphocytes Relative: 72 %
Lymphs Abs: 17 10*3/uL — ABNORMAL HIGH (ref 0.7–4.0)
MCH: 27.9 pg (ref 26.0–34.0)
MCHC: 32.4 g/dL (ref 30.0–36.0)
MCV: 86 fL (ref 80.0–100.0)
Monocytes Absolute: 1.7 10*3/uL — ABNORMAL HIGH (ref 0.1–1.0)
Monocytes Relative: 8 %
Neutro Abs: 4.3 10*3/uL (ref 1.7–7.7)
Neutrophils Relative %: 19 %
Platelets: 217 10*3/uL (ref 150–400)
RBC: 5.16 MIL/uL (ref 4.22–5.81)
RDW: 14.4 % (ref 11.5–15.5)
Smear Review: NORMAL
WBC: 23.3 10*3/uL — ABNORMAL HIGH (ref 4.0–10.5)
nRBC: 0 % (ref 0.0–0.2)

## 2021-11-18 NOTE — Progress Notes (Signed)
Hematology/Oncology Consult note [facility]  [phone] Fax:([phone]  Patient Care Team: Baxter Hire, MD as PCP - General (Internal Medicine)   Name of the patient: Andre Jackson  [id number]  June 07, 1946   Date of visit: 11/18/21  Diagnosis-stage 0 CLL  Chief complaint/ Reason for visit-routine follow-up of CLL  Heme/Onc history: patient is a 76 year old African-American male with a past medical history significant for hypertension hyperlipidemia osteoarthritis and prostate cancer in the past.  He has been referred to [place] for lymphocytosis/leukocytosis.His most recent CBC from 04/01/2021 showed a white count of 12.6, H&H of 13.5/41.9 with an MCV of 90.5 and a platelet count of 199.  Looking back at his CBC is over the last 5 years his white count has mostly remained normal between 7-9 but differential has shown some intermittent lymphocytosis in the past as well.     Results of blood work from 04/23/2021 showed a mildly elevated white count of 11.7 with an absolute lymphocyte count of 8.3.  Flow cytometry showed CD5 positive CD23 positive and FMC CD7 weakly positive B-cell lymphoproliferative disorder cells were dim positive for CD20 and FMC7.  CD103 negative.  Findings consistent with atypical CLL/SLL.  Mantle cell lymphoma not ruled out.    Interval history-patient reports doing well.  He has mild fatigue but denies any other complaints at this time.  Appetite and weight have remained stable.  Denies any drenching night sweats.  ECOG PS- 1 Pain scale- 0   Review of systems- Review of Systems  Constitutional:  Negative for chills, fever, malaise/fatigue and weight loss.  HENT:  Negative for congestion, ear discharge and nosebleeds.   Eyes:  Negative for blurred vision.  Respiratory:  Negative for cough, hemoptysis, sputum production, shortness of breath and wheezing.   Cardiovascular:  Negative for chest pain, palpitations, orthopnea  and claudication.  Gastrointestinal:  Negative for abdominal pain, blood in stool, constipation, diarrhea, heartburn, melena, nausea and vomiting.  Genitourinary:  Negative for dysuria, flank pain, frequency, hematuria and urgency.  Musculoskeletal:  Negative for back pain, joint pain and myalgias.  Skin:  Negative for rash.  Neurological:  Negative for dizziness, tingling, focal weakness, seizures, weakness and headaches.  Endo/Heme/Allergies:  Does not bruise/bleed easily.  Psychiatric/Behavioral:  Negative for depression and suicidal ideas. The patient does not have insomnia.      No Known Allergies   Past Medical History:  Diagnosis Date   Arthritis    "knees, hips" (04/03/2015)   Cataract immature    Diverticulosis    ED (erectile dysfunction)    History of kidney stones 2016   Hx of colonic polyps    Hyperlipidemia    takes Lipitor daily   Hypertension    takes Prinizide daily   Joint pain    Joint swelling    Left knee DJD    OA (osteoarthritis)    Prostate cancer ([place])    Seasonal allergies    takes Allegra daily     Past Surgical History:  Procedure Laterality Date   BACK SURGERY     COLONOSCOPY W/ BIOPSIES AND POLYPECTOMY  07/2011   "have had it once before that also"   I & D EXTREMITY Left ~ 2010   knee   JOINT REPLACEMENT     KNEE ARTHROSCOPY Left ~ 2011   LIPOMA EXCISION  07/2011   neck   LUMBAR DISC SURGERY  1970's   MULTIPLE TOOTH EXTRACTIONS  ~ 2007   REPLACEMENT TOTAL  KNEE Right 04/05/12   REVERSE SHOULDER ARTHROPLASTY Left 12/02/2017   Procedure: REVERSE SHOULDER ARTHROPLASTY;  Surgeon: Hiram Gash, MD;  Location: [place];  Service: Orthopedics;  Laterality: Left;   TONSILLECTOMY AND ADENOIDECTOMY  1940's   TOTAL HIP ARTHROPLASTY Left 04/03/2015   TOTAL HIP ARTHROPLASTY Left 04/03/2015   Procedure: TOTAL HIP ARTHROPLASTY ANTERIOR APPROACH;  Surgeon: Renette Butters, MD;  Location: [place];  Service: Orthopedics;  Laterality: Left;   TOTAL HIP  ARTHROPLASTY Right 05/10/2019   Procedure: TOTAL HIP ARTHROPLASTY ANTERIOR APPROACH;  Surgeon: Renette Butters, MD;  Location: [facility];  Service: Orthopedics;  Laterality: Right;   TOTAL KNEE ARTHROPLASTY  04/05/2012   Procedure: TOTAL KNEE ARTHROPLASTY;  Surgeon: Lorn Junes, MD;  Location: [place];  Service: Orthopedics;  Laterality: Right;  DR Noemi Chapel WANTS 90 MINUTES FOR THIS CASE   TOTAL KNEE ARTHROPLASTY  08/02/2012   Procedure: TOTAL KNEE ARTHROPLASTY;  Surgeon: Lorn Junes, MD;  Location: [place];  Service: Orthopedics;  Laterality: Left;    Social History   Socioeconomic History   Marital status: Married    Spouse name: Not on file   Number of children: Not on file   Years of education: Not on file   Highest education level: Not on file  Occupational History   Occupation: retired  Tobacco Use   Smoking status: Former    Packs/day: 0.33    Years: 44.00    Pack years: 14.52    Types: Cigarettes    Quit date: 02/10/2009    Years since quitting: 12.7   Smokeless tobacco: Never  Vaping Use   Vaping Use: Never used  Substance and Sexual Activity   Alcohol use: Yes    Comment: occasional   Drug use: No   Sexual activity: Yes  Other Topics Concern   Not on file  Social History Narrative   Not on file   Social Determinants of Health   Financial Resource Strain: Not on file  Food Insecurity: Not on file  Transportation Needs: Not on file  Physical Activity: Not on file  Stress: Not on file  Social Connections: Not on file  Intimate Partner Violence: Not on file    Family History  Problem Relation Age of Onset   Cerebral aneurysm Mother    Hypertension Mother    Hypertension Sister    Hypertension Brother    Prostate cancer Neg Hx      Current Outpatient Medications:    acetaminophen (TYLENOL) 500 MG tablet, Take 500 mg by mouth daily as needed for moderate pain., Disp: , Rfl:    aspirin EC 81 MG tablet, Take 1 tablet (81 mg total) by mouth 2 (two) times  daily. For DVT prophylaxis for 30 days after surgery., Disp: 60 tablet, Rfl: 0   atorvastatin (LIPITOR) 10 MG tablet, Take 10 mg by mouth daily., Disp: , Rfl:    fluticasone (FLONASE) 50 MCG/ACT nasal spray, Place 2 sprays into the nose daily as needed for allergies. , Disp: , Rfl:    lisinopril-hydrochlorothiazide (PRINZIDE,ZESTORETIC) 20-25 MG per tablet, Take 1 tablet by mouth daily., Disp: , Rfl:    meloxicam (MOBIC) 15 MG tablet, Take 15 mg by mouth daily., Disp: , Rfl:    Multiple Vitamin (MULTIVITAMIN WITH MINERALS) TABS tablet, Take 1 tablet by mouth daily., Disp: , Rfl:    pantoprazole (PROTONIX) 40 MG tablet, Take 40 mg by mouth daily., Disp: , Rfl:    sildenafil (REVATIO) 20 MG tablet, Take  1-3 tablets (20-60 mg total) by mouth as needed. (Patient not taking: Reported on 11/18/2021), Disp: 30 tablet, Rfl: 11  Physical exam:  Vitals:   11/18/21 1058  BP: 132/79  Pulse: 91  Resp: 16  Temp: 97.6 F (36.4 C)  TempSrc: Tympanic  SpO2: 96%  Weight: 173 lb 12.8 oz (78.8 kg)  Height: 5\' 8"  (1.727 m)   Physical Exam Cardiovascular:     Rate and Rhythm: Normal rate and regular rhythm.     Heart sounds: Normal heart sounds.  Pulmonary:     Effort: Pulmonary effort is normal.     Breath sounds: Normal breath sounds.  Abdominal:     General: Bowel sounds are normal.     Palpations: Abdomen is soft.  Lymphadenopathy:     Comments: No palpable cervical, supraclavicular, axillary or inguinal adenopathy    Skin:    General: Skin is warm and dry.  Neurological:     Mental Status: He is alert and oriented to person, place, and time.     CMP Latest Ref Rng & Units 05/03/2019  Glucose 70 - 99 mg/dL 120(H)  BUN 8 - 23 mg/dL 29(H)  Creatinine 0.61 - 1.24 mg/dL 1.41(H)  Sodium 135 - 145 mmol/L 138  Potassium 3.5 - 5.1 mmol/L 4.0  Chloride 98 - 111 mmol/L 102  CO2 22 - 32 mmol/L 27  Calcium 8.9 - 10.3 mg/dL 9.4  Total Protein 6.0 - 8.3 g/dL -  Total Bilirubin 0.3 - 1.2 mg/dL -   Alkaline Phos 39 - 117 U/L -  AST 0 - 37 U/L -  ALT 0 - 53 U/L -   CBC Latest Ref Rng & Units 11/18/2021  WBC 4.0 - 10.5 K/uL 23.3(H)  Hemoglobin 13.0 - 17.0 g/dL 14.4  Hematocrit 39.0 - 52.0 % 44.4  Platelets 150 - 400 K/uL 217     Assessment and plan- Patient is a 76 y.o. male with Rai stage 0 CLL here for a routine follow-up  Patient has isolated lymphocytosis with a lymphocyte doubling time of roughly 6 months.  His absolute lymphocyte count was 8.3 in July 2022 and up to 17 in February 2023.  No evidence of anemia or thrombocytopenia or B symptoms.  I am inclined to monitor his CBC closely with CBC with differential in 3 and 6 months and I will see him back in 6 months.  If there is a persistent rise in his lymphocyte count and a rapid lymphocyte doubling time with or without B symptoms we could consider treatment for his CLL at that time.  Clinically patient does not have any palpable adenopathy or splenomegaly   Visit Diagnosis 1. CLL (chronic lymphocytic leukemia) ([place])      Dr. Randa Evens, MD, MPH [facility] at [facility] [phone] 11/18/2021 1:59 PM

## 2021-12-20 ENCOUNTER — Other Ambulatory Visit: Payer: Self-pay

## 2021-12-20 ENCOUNTER — Inpatient Hospital Stay: Payer: Medicare Other | Attending: Oncology

## 2021-12-20 DIAGNOSIS — Z8546 Personal history of malignant neoplasm of prostate: Secondary | ICD-10-CM | POA: Insufficient documentation

## 2021-12-20 DIAGNOSIS — C61 Malignant neoplasm of prostate: Secondary | ICD-10-CM

## 2021-12-20 DIAGNOSIS — Z923 Personal history of irradiation: Secondary | ICD-10-CM | POA: Diagnosis not present

## 2021-12-20 LAB — PSA: Prostatic Specific Antigen: 0.1 ng/mL (ref 0.00–4.00)

## 2021-12-23 ENCOUNTER — Encounter: Payer: Self-pay | Admitting: Radiation Oncology

## 2021-12-23 ENCOUNTER — Ambulatory Visit
Admission: RE | Admit: 2021-12-23 | Discharge: 2021-12-23 | Disposition: A | Discharge status: Home / Self Care | Payer: Medicare Other | Source: Ambulatory Visit | Attending: Radiation Oncology | Admitting: Radiation Oncology

## 2021-12-23 ENCOUNTER — Other Ambulatory Visit: Payer: Self-pay

## 2021-12-23 ENCOUNTER — Other Ambulatory Visit: Payer: Self-pay | Admitting: *Deleted

## 2021-12-23 DIAGNOSIS — C61 Malignant neoplasm of prostate: Secondary | ICD-10-CM | POA: Diagnosis present

## 2021-12-23 DIAGNOSIS — Z923 Personal history of irradiation: Secondary | ICD-10-CM | POA: Insufficient documentation

## 2021-12-23 DIAGNOSIS — C9110 Chronic lymphocytic leukemia of B-cell type not having achieved remission: Secondary | ICD-10-CM | POA: Insufficient documentation

## 2021-12-23 NOTE — Progress Notes (Signed)
Radiation Oncology ?Follow up Note ? ?Name: Andre Jackson   ?Date:   12/23/2021 ?MRN:  [id number] ?DOB: 1945/11/16  ? ? ?This 76 y.o. male presents to the clinic today for 3-year follow-up status post IMRT radiation therapy for stage IIa adenocarcinoma the prostate. ? ?REFERRING PROVIDER: Baxter Hire, MD ? ?HPI: Patient is a 76 year old male now out over 3 years having completed IMRT radiation therapy for Gleason 7 adenocarcinoma the prostate presenting with a PSA of 4.7.  Seen today in routine follow-up he is doing well specifically denies any increased lower urinary tract symptoms diarrhea or fatigue.  He currently is being followed for stage 0 CLL.  His most recent PSA is 0.10.. ? ?COMPLICATIONS OF TREATMENT: none ? ?FOLLOW UP COMPLIANCE: keeps appointments  ? ?PHYSICAL EXAM:  ?BP (!) (P) 147/83 (BP Location: Left Arm, Patient Position: Sitting)   Pulse (P) 83   Temp (!) (P) 97.1 ?F (36.2 ?C) (Tympanic)   Resp (P) 18   Wt (P) 175 lb 4.8 oz (79.5 kg)   BMI (P) 26.65 kg/m?  ?Well-developed well-nourished patient in NAD. HEENT reveals PERLA, EOMI, discs not visualized.  Oral cavity is clear. No oral mucosal lesions are identified. Neck is clear without evidence of cervical or supraclavicular adenopathy. Lungs are clear to A&P. Cardiac examination is essentially unremarkable with regular rate and rhythm without murmur rub or thrill. Abdomen is benign with no organomegaly or masses noted. Motor sensory and DTR levels are equal and symmetric in the upper and lower extremities. Cranial nerves II through XII are grossly intact. Proprioception is intact. No peripheral adenopathy or edema is identified. No motor or sensory levels are noted. Crude visual fields are within normal range. ? ?RADIOLOGY RESULTS: No current films to review ? ?PLAN: Present time patient is now at over 3 years under excellent biochemical control of his prostate cancer with very low side effect profile.  He continues follow-up care for CLL  with medical oncology.  I have asked to see him back in 1 year for follow-up.  Patient is to call with any concerns. ? ?I would like to take this opportunity to thank you for allowing me to participate in the care of your patient.. ?  ? Noreene Filbert, MD ? ?

## 2022-02-17 ENCOUNTER — Inpatient Hospital Stay: Payer: Medicare Other | Attending: Oncology

## 2022-02-17 DIAGNOSIS — C9110 Chronic lymphocytic leukemia of B-cell type not having achieved remission: Secondary | ICD-10-CM | POA: Insufficient documentation

## 2022-02-17 LAB — CBC WITH DIFFERENTIAL/PLATELET
Abs Immature Granulocytes: 0.03 10*3/uL (ref 0.00–0.07)
Basophils Absolute: 0 10*3/uL (ref 0.0–0.1)
Basophils Relative: 0 %
Eosinophils Absolute: 0.1 10*3/uL (ref 0.0–0.5)
Eosinophils Relative: 1 %
HCT: 40.5 % (ref 39.0–52.0)
Hemoglobin: 13 g/dL (ref 13.0–17.0)
Immature Granulocytes: 0 %
Lymphocytes Relative: 81 %
Lymphs Abs: 24.9 10*3/uL — ABNORMAL HIGH (ref 0.7–4.0)
MCH: 28.3 pg (ref 26.0–34.0)
MCHC: 32.1 g/dL (ref 30.0–36.0)
MCV: 88.2 fL (ref 80.0–100.0)
Monocytes Absolute: 1.7 10*3/uL — ABNORMAL HIGH (ref 0.1–1.0)
Monocytes Relative: 6 %
Neutro Abs: 3.5 10*3/uL (ref 1.7–7.7)
Neutrophils Relative %: 12 %
Platelets: 215 10*3/uL (ref 150–400)
RBC: 4.59 MIL/uL (ref 4.22–5.81)
RDW: 13.9 % (ref 11.5–15.5)
Smear Review: NORMAL
WBC: 30.3 10*3/uL — ABNORMAL HIGH (ref 4.0–10.5)
nRBC: 0 % (ref 0.0–0.2)

## 2022-05-19 ENCOUNTER — Other Ambulatory Visit: Payer: Medicare Other

## 2022-05-30 ENCOUNTER — Other Ambulatory Visit: Payer: Medicare Other

## 2022-05-30 DIAGNOSIS — C61 Malignant neoplasm of prostate: Secondary | ICD-10-CM

## 2022-05-31 LAB — PSA: Prostate Specific Ag, Serum: 0.1 ng/mL (ref 0.0–4.0)

## 2022-06-03 ENCOUNTER — Inpatient Hospital Stay: Payer: Medicare Other | Admitting: Oncology

## 2022-06-03 ENCOUNTER — Inpatient Hospital Stay: Payer: Medicare Other

## 2022-06-04 ENCOUNTER — Ambulatory Visit: Payer: Medicare HMO | Admitting: Urology

## 2022-06-05 ENCOUNTER — Ambulatory Visit: Payer: Medicare Other | Admitting: Urology

## 2022-06-05 ENCOUNTER — Encounter: Payer: Self-pay | Admitting: Urology

## 2022-06-05 VITALS — BP 134/78 | HR 88 | Ht 68.0 in | Wt 164.0 lb

## 2022-06-05 DIAGNOSIS — Z8546 Personal history of malignant neoplasm of prostate: Secondary | ICD-10-CM | POA: Diagnosis not present

## 2022-06-05 DIAGNOSIS — C61 Malignant neoplasm of prostate: Secondary | ICD-10-CM

## 2022-06-05 DIAGNOSIS — N529 Male erectile dysfunction, unspecified: Secondary | ICD-10-CM

## 2022-06-05 MED ORDER — SILDENAFIL CITRATE 20 MG PO TABS: ORAL_TABLET | ORAL | 11 refills | Status: DC

## 2022-06-05 NOTE — Progress Notes (Signed)
   06/05/2022 2:43 PM   Andre Jackson 03/31/1946 [id number]   Reason for visit: Follow up prostate cancer and erectile dysfunction   HPI: I saw [name] back in urology clinic for follow-up of prostate cancer and erectile dysfunction.  He is a 76 year old African-American male with a family history of non-lethal prostate cancer who ultimately was diagnosed with favorable intermediate risk prostate cancer with PSA of 4.7, and 8/12 cores positive for Gleason score 3+4=7 with max core involvement greater than 90%.    He completed external beam radiation therapy with Dr. Baruch Gouty in spring 2020.  He continues to do extremely well since and denies any urinary significant urinary symptoms.   He has erectile dysfunction that is well controlled with 2 to 3 tablets of Revatio on demand and he continues to do well with this medication as needed.  PSA remains very low and stable, 0.1 this year from 0.2 last year, indicating excellent control of his prostate cancer.  We discussed the need for ongoing surveillance and the risk of recurrence, as well as the risks, benefits, and side effects of sildenafil.   -RTC 1 year with PSA prior -Sildenafil refilled, risks and benefits discussed -If PSA normal next year, likely can follow-up with PCP for yearly PSA and sildenafil refilled  Billey Co, MD  [facility] [address] [place], [place] [address] [phone]

## 2022-07-08 ENCOUNTER — Inpatient Hospital Stay: Payer: Medicare Other

## 2022-07-08 ENCOUNTER — Inpatient Hospital Stay: Payer: Medicare Other | Attending: Oncology | Admitting: Oncology

## 2022-07-08 ENCOUNTER — Encounter: Payer: Self-pay | Admitting: Oncology

## 2022-07-08 VITALS — BP 137/72 | HR 78 | Temp 98.7°F | Resp 18 | Wt 173.0 lb

## 2022-07-08 DIAGNOSIS — C9110 Chronic lymphocytic leukemia of B-cell type not having achieved remission: Secondary | ICD-10-CM

## 2022-07-08 DIAGNOSIS — Z87891 Personal history of nicotine dependence: Secondary | ICD-10-CM | POA: Diagnosis not present

## 2022-07-08 DIAGNOSIS — C61 Malignant neoplasm of prostate: Secondary | ICD-10-CM

## 2022-07-08 LAB — COMPREHENSIVE METABOLIC PANEL
ALT: 13 U/L (ref 0–44)
AST: 17 U/L (ref 15–41)
Albumin: 3.7 g/dL (ref 3.5–5.0)
Alkaline Phosphatase: 80 U/L (ref 38–126)
Anion gap: 5 (ref 5–15)
BUN: 20 mg/dL (ref 8–23)
CO2: 32 mmol/L (ref 22–32)
Calcium: 9 mg/dL (ref 8.9–10.3)
Chloride: 102 mmol/L (ref 98–111)
Creatinine, Ser: 1.14 mg/dL (ref 0.61–1.24)
GFR, Estimated: >60 mL/min (ref >60)
Glucose, Bld: 123 mg/dL — ABNORMAL HIGH (ref 70–99)
Potassium: 3.6 mmol/L (ref 3.5–5.1)
Sodium: 139 mmol/L (ref 135–145)
Total Bilirubin: 0.8 mg/dL (ref 0.3–1.2)
Total Protein: 7.6 g/dL (ref 6.5–8.1)

## 2022-07-08 LAB — CBC WITH DIFFERENTIAL/PLATELET
Abs Immature Granulocytes: 0.06 10*3/uL (ref 0.00–0.07)
Basophils Absolute: 0.1 10*3/uL (ref 0.0–0.1)
Basophils Relative: 0 %
Eosinophils Absolute: 0.3 10*3/uL (ref 0.0–0.5)
Eosinophils Relative: 1 %
HCT: 39 % (ref 39.0–52.0)
Hemoglobin: 12.6 g/dL — ABNORMAL LOW (ref 13.0–17.0)
Immature Granulocytes: 0 %
Lymphocytes Relative: 73 %
Lymphs Abs: 26.1 10*3/uL — ABNORMAL HIGH (ref 0.7–4.0)
MCH: 28.6 pg (ref 26.0–34.0)
MCHC: 32.3 g/dL (ref 30.0–36.0)
MCV: 88.6 fL (ref 80.0–100.0)
Monocytes Absolute: 3.4 10*3/uL — ABNORMAL HIGH (ref 0.1–1.0)
Monocytes Relative: 10 %
Neutro Abs: 5.6 10*3/uL (ref 1.7–7.7)
Neutrophils Relative %: 16 %
Platelets: 195 10*3/uL (ref 150–400)
RBC: 4.4 MIL/uL (ref 4.22–5.81)
RDW: 14.6 % (ref 11.5–15.5)
Smear Review: NORMAL
WBC: 35.6 10*3/uL — ABNORMAL HIGH (ref 4.0–10.5)
nRBC: 0 % (ref 0.0–0.2)

## 2022-07-08 LAB — LACTATE DEHYDROGENASE: LDH: 126 U/L (ref 98–192)

## 2022-07-08 NOTE — Progress Notes (Signed)
Hematology/Oncology Consult note [facility]  Telephone:([phone] Fax:(336) [phone]  Patient Care Team: Baxter Hire, MD as PCP - General (Internal Medicine)   Name of the patient: Andre Jackson  [id number]  1946/06/13   Date of visit: 07/08/22  Diagnosis-stage 0 CLL  Chief complaint/ Reason for visit-routine follow-up of CLL  Heme/Onc history: patient is a 76 year old African-American male with a past medical history significant for hypertension hyperlipidemia osteoarthritis and prostate cancer in the past.  He has been referred to [place] for lymphocytosis/leukocytosis.His most recent CBC from 04/01/2021 showed a white count of 12.6, H&H of 13.5/41.9 with an MCV of 90.5 and a platelet count of 199.  Looking back at his CBC is over the last 5 years his white count has mostly remained normal between 7-9 but differential has shown some intermittent lymphocytosis in the past as well.     Results of blood work from 04/23/2021 showed a mildly elevated white count of 11.7 with an absolute lymphocyte count of 8.3.  Flow cytometry showed CD5 positive CD23 positive and FMC CD7 weakly positive B-cell lymphoproliferative disorder cells were dim positive for CD20 and FMC7.  CD103 negative.  Findings consistent with atypical CLL/SLL.  Mantle cell lymphoma not ruled out.    Interval history-patient is doing well presently.  Appetite and weight have remained stable.  Denies any overt fatigue or drenching night sweats  ECOG PS- 1 Pain scale- 0   Review of systems- Review of Systems  Constitutional:  Negative for chills, fever, malaise/fatigue and weight loss.  HENT:  Negative for congestion, ear discharge and nosebleeds.   Eyes:  Negative for blurred vision.  Respiratory:  Negative for cough, hemoptysis, sputum production, shortness of breath and wheezing.   Cardiovascular:  Negative for chest pain, palpitations, orthopnea and claudication.  Gastrointestinal:   Negative for abdominal pain, blood in stool, constipation, diarrhea, heartburn, melena, nausea and vomiting.  Genitourinary:  Negative for dysuria, flank pain, frequency, hematuria and urgency.  Musculoskeletal:  Negative for back pain, joint pain and myalgias.  Skin:  Negative for rash.  Neurological:  Negative for dizziness, tingling, focal weakness, seizures, weakness and headaches.  Endo/Heme/Allergies:  Does not bruise/bleed easily.  Psychiatric/Behavioral:  Negative for depression and suicidal ideas. The patient does not have insomnia.       No Known Allergies   Past Medical History:  Diagnosis Date   Arthritis    "knees, hips" (04/03/2015)   Cataract immature    Diverticulosis    ED (erectile dysfunction)    History of kidney stones 2016   Hx of colonic polyps    Hyperlipidemia    takes Lipitor daily   Hypertension    takes Prinizide daily   Joint pain    Joint swelling    Left knee DJD    OA (osteoarthritis)    Prostate cancer ([place])    Seasonal allergies    takes Allegra daily     Past Surgical History:  Procedure Laterality Date   BACK SURGERY     COLONOSCOPY W/ BIOPSIES AND POLYPECTOMY  07/2011   "have had it once before that also"   I & D EXTREMITY Left ~ 2010   knee   JOINT REPLACEMENT     KNEE ARTHROSCOPY Left ~ 2011   LIPOMA EXCISION  07/2011   neck   LUMBAR DISC SURGERY  1970's   MULTIPLE TOOTH EXTRACTIONS  ~ 2007   REPLACEMENT TOTAL KNEE Right 04/05/12   REVERSE SHOULDER ARTHROPLASTY  Left 12/02/2017   Procedure: REVERSE SHOULDER ARTHROPLASTY;  Surgeon: Hiram Gash, MD;  Location: [place];  Service: Orthopedics;  Laterality: Left;   TONSILLECTOMY AND ADENOIDECTOMY  1940's   TOTAL HIP ARTHROPLASTY Left 04/03/2015   TOTAL HIP ARTHROPLASTY Left 04/03/2015   Procedure: TOTAL HIP ARTHROPLASTY ANTERIOR APPROACH;  Surgeon: Renette Butters, MD;  Location: [place];  Service: Orthopedics;  Laterality: Left;   TOTAL HIP ARTHROPLASTY Right 05/10/2019   Procedure:  TOTAL HIP ARTHROPLASTY ANTERIOR APPROACH;  Surgeon: Renette Butters, MD;  [facility];  Service: Orthopedics;  Laterality: Right;   TOTAL KNEE ARTHROPLASTY  04/05/2012   Procedure: TOTAL KNEE ARTHROPLASTY;  Surgeon: Lorn Junes, MD;  Location: [place];  Service: Orthopedics;  Laterality: Right;  DR Noemi Chapel WANTS 90 MINUTES FOR THIS CASE   TOTAL KNEE ARTHROPLASTY  08/02/2012   Procedure: TOTAL KNEE ARTHROPLASTY;  Surgeon: Lorn Junes, MD;  Location: [place];  Service: Orthopedics;  Laterality: Left;    Social History   Socioeconomic History   Marital status: Married    Spouse name: Not on file   Number of children: Not on file   Years of education: Not on file   Highest education level: Not on file  Occupational History   Occupation: retired  Tobacco Use   Smoking status: Former    Packs/day: 0.33    Years: 44.00    Total pack years: 14.52    Types: Cigarettes    Quit date: 02/10/2009    Years since quitting: 13.4    Passive exposure: Past   Smokeless tobacco: Never  Vaping Use   Vaping Use: Never used  Substance and Sexual Activity   Alcohol use: Yes    Comment: occasional   Drug use: No   Sexual activity: Yes  Other Topics Concern   Not on file  Social History Narrative   Not on file   Social Determinants of Health   Financial Resource Strain: Not on file  Food Insecurity: Not on file  Transportation Needs: Not on file  Physical Activity: Not on file  Stress: Not on file  Social Connections: Not on file  Intimate Partner Violence: Not on file    Family History  Problem Relation Age of Onset   Cerebral aneurysm Mother    Hypertension Mother    Hypertension Sister    Hypertension Brother    Prostate cancer Neg Hx      Current Outpatient Medications:    acetaminophen (TYLENOL) 500 MG tablet, Take 500 mg by mouth daily as needed for moderate pain., Disp: , Rfl:    atorvastatin (LIPITOR) 10 MG tablet, Take 10 mg by mouth daily., Disp: , Rfl:     lisinopril-hydrochlorothiazide (PRINZIDE,ZESTORETIC) 20-25 MG per tablet, Take 1 tablet by mouth daily., Disp: , Rfl:    Multiple Vitamin (MULTIVITAMIN WITH MINERALS) TABS tablet, Take 1 tablet by mouth daily., Disp: , Rfl:    pantoprazole (PROTONIX) 40 MG tablet, Take 40 mg by mouth daily., Disp: , Rfl:    sildenafil (REVATIO) 20 MG tablet, Take 20-'100mg'$  by mouth as needed prior to intercourse, Disp: 30 tablet, Rfl: 11   aspirin EC 81 MG tablet, Take 1 tablet (81 mg total) by mouth 2 (two) times daily. For DVT prophylaxis for 30 days after surgery. (Patient not taking: Reported on 07/08/2022), Disp: 60 tablet, Rfl: 0   meloxicam (MOBIC) 15 MG tablet, Take 15 mg by mouth daily. (Patient not taking: Reported on 07/08/2022), Disp: , Rfl:  Physical exam:  Vitals:   07/08/22 1033  BP: 137/72  Pulse: 78  Resp: 18  Temp: 98.7 F (37.1 C)  SpO2: 100%  Weight: 173 lb (78.5 kg)   Physical Exam Constitutional:      General: He is not in acute distress. Cardiovascular:     Rate and Rhythm: Normal rate and regular rhythm.     Heart sounds: Normal heart sounds.  Pulmonary:     Effort: Pulmonary effort is normal.     Breath sounds: Normal breath sounds.  Abdominal:     General: Bowel sounds are normal.     Palpations: Abdomen is soft.  Lymphadenopathy:     Comments: Mild palpable left supraclavicular adenopathy.  No palpable bilateral axillary or inguinal adenopathy  Skin:    General: Skin is warm and dry.  Neurological:     Mental Status: He is alert and oriented to person, place, and time.         Latest Ref Rng & Units 07/08/2022   10:05 AM  CMP  Glucose 70 - 99 mg/dL 123   BUN 8 - 23 mg/dL 20   Creatinine 0.61 - 1.24 mg/dL 1.14   Sodium 135 - 145 mmol/L 139   Potassium 3.5 - 5.1 mmol/L 3.6   Chloride 98 - 111 mmol/L 102   CO2 22 - 32 mmol/L 32   Calcium 8.9 - 10.3 mg/dL 9.0   Total Protein 6.5 - 8.1 g/dL 7.6   Total Bilirubin 0.3 - 1.2 mg/dL 0.8   Alkaline Phos 38 - 126 U/L  80   AST 15 - 41 U/L 17   ALT 0 - 44 U/L 13       Latest Ref Rng & Units 07/08/2022   10:05 AM  CBC  WBC 4.0 - 10.5 K/uL 35.6   Hemoglobin 13.0 - 17.0 g/dL 12.6   Hematocrit 39.0 - 52.0 % 39.0   Platelets 150 - 400 K/uL 195      Assessment and plan- Patient is a 76 y.o. male with Rai stage 0 CLL here for routine follow-up  Clinically patient is doing well with no palpable adenopathy other than mild still left supraclavicular adenopathy that is nonbulky.  No palpable hepatosplenomegaly.No significant cytopenias.  His white cell count has been gradually trending up and presently at 35 as compared to 16 in November 2022.  Lymphocyte doubling time remains around 7 to 8 months.  I am continuing to monitor his CBC every 6 months and see him back thereafter.  Hemoglobin is somewhat lower at 12.6 today and I will plan to check ferritin and iron studies B12 folate and reticulocyte count again in 6 months time   Visit Diagnosis 1. CLL (chronic lymphocytic leukemia) ([place])      Dr. Randa Evens, MD, MPH [facility] at [facility] [phone] 07/08/2022 3:49 PM

## 2022-12-15 ENCOUNTER — Inpatient Hospital Stay: Payer: Medicare Other | Attending: Oncology

## 2022-12-15 DIAGNOSIS — C9110 Chronic lymphocytic leukemia of B-cell type not having achieved remission: Secondary | ICD-10-CM | POA: Insufficient documentation

## 2022-12-15 DIAGNOSIS — C61 Malignant neoplasm of prostate: Secondary | ICD-10-CM | POA: Insufficient documentation

## 2022-12-15 LAB — CBC WITH DIFFERENTIAL/PLATELET
Abs Immature Granulocytes: 0.07 10*3/uL (ref 0.00–0.07)
Basophils Absolute: 0.2 10*3/uL — ABNORMAL HIGH (ref 0.0–0.1)
Basophils Relative: 0 %
Eosinophils Absolute: 0.2 10*3/uL (ref 0.0–0.5)
Eosinophils Relative: 1 %
HCT: 41.4 % (ref 39.0–52.0)
Hemoglobin: 13.3 g/dL (ref 13.0–17.0)
Immature Granulocytes: 0 %
Lymphocytes Relative: 76 %
Lymphs Abs: 34 10*3/uL — ABNORMAL HIGH (ref 0.7–4.0)
MCH: 28.8 pg (ref 26.0–34.0)
MCHC: 32.1 g/dL (ref 30.0–36.0)
MCV: 89.6 fL (ref 80.0–100.0)
Monocytes Absolute: 5.9 10*3/uL — ABNORMAL HIGH (ref 0.1–1.0)
Monocytes Relative: 13 %
Neutro Abs: 4.4 10*3/uL (ref 1.7–7.7)
Neutrophils Relative %: 10 %
Platelets: 211 10*3/uL (ref 150–400)
RBC: 4.62 MIL/uL (ref 4.22–5.81)
RDW: 14.7 % (ref 11.5–15.5)
Smear Review: NORMAL
WBC: 44.8 10*3/uL — ABNORMAL HIGH (ref 4.0–10.5)
nRBC: 0 % (ref 0.0–0.2)

## 2022-12-15 LAB — FOLATE: Folate: >40 ng/mL (ref >5.9)

## 2022-12-15 LAB — IRON AND TIBC
Iron: 76 ug/dL (ref 45–182)
Saturation Ratios: 23 % (ref 17.9–39.5)
TIBC: 326 ug/dL (ref 250–450)
UIBC: 250 ug/dL

## 2022-12-15 LAB — RETICULOCYTES
Immature Retic Fract: 3.7 % (ref 2.3–15.9)
RBC.: 4.72 MIL/uL (ref 4.22–5.81)
Retic Count, Absolute: 75 10*3/uL (ref 19.0–186.0)
Retic Ct Pct: 1.6 % (ref 0.4–3.1)

## 2022-12-15 LAB — FERRITIN: Ferritin: 91 ng/mL (ref 24–336)

## 2022-12-15 LAB — VITAMIN B12: Vitamin B-12: 913 pg/mL (ref 180–914)

## 2022-12-22 ENCOUNTER — Inpatient Hospital Stay: Payer: Medicare Other

## 2022-12-22 ENCOUNTER — Ambulatory Visit
Admission: RE | Admit: 2022-12-22 | Discharge: 2022-12-22 | Disposition: A | Discharge status: Home / Self Care | Payer: Medicare Other | Source: Ambulatory Visit | Attending: Radiation Oncology | Admitting: Radiation Oncology

## 2022-12-22 VITALS — BP 136/74 | HR 83 | Temp 97.6°F | Resp 14 | Ht 68.0 in | Wt 175.1 lb

## 2022-12-22 DIAGNOSIS — C9110 Chronic lymphocytic leukemia of B-cell type not having achieved remission: Secondary | ICD-10-CM | POA: Insufficient documentation

## 2022-12-22 DIAGNOSIS — Z923 Personal history of irradiation: Secondary | ICD-10-CM | POA: Insufficient documentation

## 2022-12-22 DIAGNOSIS — C61 Malignant neoplasm of prostate: Secondary | ICD-10-CM | POA: Insufficient documentation

## 2022-12-22 LAB — PSA: Prostatic Specific Antigen: 0.07 ng/mL (ref 0.00–4.00)

## 2022-12-22 NOTE — Progress Notes (Signed)
Radiation Oncology Follow up Note  Name: Andre Jackson   Date:   12/22/2022 MRN:  [id number] DOB: December 01, 1945    This 77 y.o. male presents to the clinic today for 4-year follow-up status post IMRT radiation therapy for stage IIa adenocarcinoma the prostate Gleason 7 presenting with a PSA of 4.7.  REFERRING PROVIDER: Baxter Hire, MD  HPI: Patient is a 77 year old male now out over 4 years having completed IMRT radiation therapy for Gleason 7 adenocarcinoma the prostate seen today in routine follow-up he is doing well specifically denies any increased lower urinary tract symptoms diarrhea or fatigue.  He did not have a PSA drawn as we requested earlier this week although.  His PSA was 0.16 months ago.  He will have that repeated today.  He is currently under management by medical oncology for atypical CLL.  COMPLICATIONS OF TREATMENT: none  FOLLOW UP COMPLIANCE: keeps appointments   PHYSICAL EXAM:  BP 136/74   Pulse 83   Temp 97.6 F (36.4 C)   Resp 14   Ht '5\' 8"'$  (1.727 m)   Wt 175 lb 1.6 oz (79.4 kg)   BMI 26.62 kg/m  Well-developed well-nourished patient in NAD. HEENT reveals PERLA, EOMI, discs not visualized.  Oral cavity is clear. No oral mucosal lesions are identified. Neck is clear without evidence of cervical or supraclavicular adenopathy. Lungs are clear to A&P. Cardiac examination is essentially unremarkable with regular rate and rhythm without murmur rub or thrill. Abdomen is benign with no organomegaly or masses noted. Motor sensory and DTR levels are equal and symmetric in the upper and lower extremities. Cranial nerves II through XII are grossly intact. Proprioception is intact. No peripheral adenopathy or edema is identified. No motor or sensory levels are noted. Crude visual fields are within normal range.  RADIOLOGY RESULTS: No current films for review  PLAN: Present time patient is doing well we will check on his PSA on return follow-up care over to medical  oncology since they are following him for CLL.  Will be happy to reevaluate the patient anytime should that be indicated.  I would like to take this opportunity to thank you for allowing me to participate in the care of your patient.Noreene Filbert, MD

## 2023-01-07 ENCOUNTER — Inpatient Hospital Stay: Payer: Medicare Other

## 2023-01-07 ENCOUNTER — Inpatient Hospital Stay: Payer: Medicare Other | Admitting: Oncology

## 2023-01-07 ENCOUNTER — Encounter: Payer: Self-pay | Admitting: Oncology

## 2023-01-07 VITALS — BP 133/75 | HR 82 | Temp 98.4°F | Resp 18 | Ht 68.0 in | Wt 175.7 lb

## 2023-01-07 DIAGNOSIS — C9110 Chronic lymphocytic leukemia of B-cell type not having achieved remission: Secondary | ICD-10-CM

## 2023-01-07 NOTE — Progress Notes (Signed)
No concerns for the provider. 

## 2023-01-07 NOTE — Progress Notes (Signed)
Hematology/Oncology Consult note [facility]  [phone] [phone]  Patient Care Team: Baxter Hire, MD as PCP - General (Internal Medicine)   Name of the patient: Andre Jackson  [id number]  [birth year]   Date of visit: 01/07/23  Diagnosis-Rai stage 0 CLL  Chief complaint/ Reason for visit-routine follow-up of CLL  Heme/Onc history: patient is a 77 year old African-American male with a past medical history significant for hypertension hyperlipidemia osteoarthritis and prostate cancer in the past.  He has been referred to [place] for lymphocytosis/leukocytosis.His most recent CBC from 04/01/2021 showed a white count of 12.6, H&H of 13.5/41.9 with an MCV of 90.5 and a platelet count of 199.  Looking back at his CBC is over the last 5 years his white count has mostly remained normal between 7-9 but differential has shown some intermittent lymphocytosis in the past as well.     Results of blood work from 04/23/2021 showed a mildly elevated white count of 11.7 with an absolute lymphocyte count of 8.3.  Flow cytometry showed CD5 positive CD23 positive and FMC CD7 weakly positive B-cell lymphoproliferative disorder cells were dim positive for CD20 and FMC7.  CD103 negative.  Findings consistent with atypical CLL/SLL.  Mantle cell lymphoma not ruled out.  CLL has not required any treatment so far   Interval history-he is doing well for his age.Appetite and weight have remained stable.  He denies any new aches and pains anywhere.  ECOG PS- 1 Pain scale- 0  Review of systems- Review of Systems  Constitutional:  Negative for chills, fever, malaise/fatigue and weight loss.  HENT:  Negative for congestion, ear discharge and nosebleeds.   Eyes:  Negative for blurred vision.  Respiratory:  Negative for cough, hemoptysis, sputum production, shortness of breath and wheezing.   Cardiovascular:  Negative for chest pain, palpitations, orthopnea and  claudication.  Gastrointestinal:  Negative for abdominal pain, blood in stool, constipation, diarrhea, heartburn, melena, nausea and vomiting.  Genitourinary:  Negative for dysuria, flank pain, frequency, hematuria and urgency.  Musculoskeletal:  Negative for back pain, joint pain and myalgias.  Skin:  Negative for rash.  Neurological:  Negative for dizziness, tingling, focal weakness, seizures, weakness and headaches.  Endo/Heme/Allergies:  Does not bruise/bleed easily.  Psychiatric/Behavioral:  Negative for depression and suicidal ideas. The patient does not have insomnia.       No Known Allergies   Past Medical History:  Diagnosis Date   Arthritis    "knees, hips" (04/03/2015)   Cataract immature    Diverticulosis    ED (erectile dysfunction)    History of kidney stones 2016   Hx of colonic polyps    Hyperlipidemia    takes Lipitor daily   Hypertension    takes Prinizide daily   Joint pain    Joint swelling    Left knee DJD    OA (osteoarthritis)    Prostate cancer ([place])    Seasonal allergies    takes Allegra daily     Past Surgical History:  Procedure Laterality Date   BACK SURGERY     COLONOSCOPY W/ BIOPSIES AND POLYPECTOMY  07/2011   "have had it once before that also"   I & D EXTREMITY Left ~ 2010   knee   JOINT REPLACEMENT     KNEE ARTHROSCOPY Left ~ 2011   LIPOMA EXCISION  07/2011   neck   LUMBAR DISC SURGERY  1970's   MULTIPLE TOOTH EXTRACTIONS  ~ 2007   REPLACEMENT TOTAL  KNEE Right 04/05/12   REVERSE SHOULDER ARTHROPLASTY Left 12/02/2017   Procedure: REVERSE SHOULDER ARTHROPLASTY;  Surgeon: Hiram Gash, MD;  Location: [place];  Service: Orthopedics;  Laterality: Left;   TONSILLECTOMY AND ADENOIDECTOMY  1940's   TOTAL HIP ARTHROPLASTY Left 04/03/2015   TOTAL HIP ARTHROPLASTY Left 04/03/2015   Procedure: TOTAL HIP ARTHROPLASTY ANTERIOR APPROACH;  Surgeon: Renette Butters, MD;  Location: [place];  Service: Orthopedics;  Laterality: Left;   TOTAL HIP  ARTHROPLASTY Right 05/10/2019   Procedure: TOTAL HIP ARTHROPLASTY ANTERIOR APPROACH;  Surgeon: Renette Butters, MD;  Location: [facility];  Service: Orthopedics;  Laterality: Right;   TOTAL KNEE ARTHROPLASTY  04/05/2012   Procedure: TOTAL KNEE ARTHROPLASTY;  Surgeon: Lorn Junes, MD;  Location: [place];  Service: Orthopedics;  Laterality: Right;  DR Noemi Chapel WANTS 90 MINUTES FOR THIS CASE   TOTAL KNEE ARTHROPLASTY  08/02/2012   Procedure: TOTAL KNEE ARTHROPLASTY;  Surgeon: Lorn Junes, MD;  Location: [place];  Service: Orthopedics;  Laterality: Left;    Social History   Socioeconomic History   Marital status: Married    Spouse name: Not on file   Number of children: Not on file   Years of education: Not on file   Highest education level: Not on file  Occupational History   Occupation: retired  Tobacco Use   Smoking status: Former    Packs/day: 0.33    Years: 44.00    Additional pack years: 0.00    Total pack years: 14.52    Types: Cigarettes    Quit date: 02/10/2009    Years since quitting: 13.9    Passive exposure: Past   Smokeless tobacco: Never  Vaping Use   Vaping Use: Never used  Substance and Sexual Activity   Alcohol use: Yes    Comment: occasional   Drug use: No   Sexual activity: Yes  Other Topics Concern   Not on file  Social History Narrative   Not on file   Social Determinants of Health   Financial Resource Strain: Not on file  Food Insecurity: Not on file  Transportation Needs: Not on file  Physical Activity: Not on file  Stress: Not on file  Social Connections: Not on file  Intimate Partner Violence: Not on file    Family History  Problem Relation Age of Onset   Cerebral aneurysm Mother    Hypertension Mother    Hypertension Sister    Hypertension Brother    Prostate cancer Neg Hx      Current Outpatient Medications:    acetaminophen (TYLENOL) 500 MG tablet, Take 500 mg by mouth daily as needed for moderate pain., Disp: , Rfl:     lisinopril-hydrochlorothiazide (PRINZIDE,ZESTORETIC) 20-25 MG per tablet, Take 1 tablet by mouth daily., Disp: , Rfl:    Multiple Vitamin (MULTIVITAMIN WITH MINERALS) TABS tablet, Take 1 tablet by mouth daily., Disp: , Rfl:    pantoprazole (PROTONIX) 40 MG tablet, Take 40 mg by mouth daily., Disp: , Rfl:    sildenafil (REVATIO) 20 MG tablet, Take 20-100mg  by mouth as needed prior to intercourse, Disp: 30 tablet, Rfl: 11   aspirin EC 81 MG tablet, Take 1 tablet (81 mg total) by mouth 2 (two) times daily. For DVT prophylaxis for 30 days after surgery. (Patient not taking: Reported on 07/08/2022), Disp: 60 tablet, Rfl: 0   atorvastatin (LIPITOR) 10 MG tablet, Take 10 mg by mouth daily. (Patient not taking: Reported on 01/07/2023), Disp: , Rfl:  meloxicam (MOBIC) 15 MG tablet, Take 15 mg by mouth daily. (Patient not taking: Reported on 07/08/2022), Disp: , Rfl:   Physical exam:  Vitals:   01/07/23 1121  BP: 133/75  Pulse: 82  Resp: 18  Temp: 98.4 F (36.9 C)  TempSrc: Tympanic  SpO2: 100%  Weight: 175 lb 11.2 oz (79.7 kg)  Height: 5\' 8"  (1.727 m)   Physical Exam Cardiovascular:     Rate and Rhythm: Normal rate and regular rhythm.     Heart sounds: Normal heart sounds.  Pulmonary:     Effort: Pulmonary effort is normal.     Breath sounds: Normal breath sounds.  Abdominal:     General: Bowel sounds are normal.     Palpations: Abdomen is soft.     Comments: No palpable hepatosplenomegaly  Lymphadenopathy:     Comments: Palpable bilateral supraclavicular adenopathy  Skin:    General: Skin is warm and dry.  Neurological:     Mental Status: He is alert and oriented to person, place, and time.         Latest Ref Rng & Units 07/08/2022   10:05 AM  CMP  Glucose 70 - 99 mg/dL 123   BUN 8 - 23 mg/dL 20   Creatinine 0.61 - 1.24 mg/dL 1.14   Sodium 135 - 145 mmol/L 139   Potassium 3.5 - 5.1 mmol/L 3.6   Chloride 98 - 111 mmol/L 102   CO2 22 - 32 mmol/L 32   Calcium 8.9 - 10.3 mg/dL  9.0   Total Protein 6.5 - 8.1 g/dL 7.6   Total Bilirubin 0.3 - 1.2 mg/dL 0.8   Alkaline Phos 38 - 126 U/L 80   AST 15 - 41 U/L 17   ALT 0 - 44 U/L 13       Latest Ref Rng & Units 12/15/2022    9:59 AM  CBC  WBC 4.0 - 10.5 K/uL 44.8   Hemoglobin 13.0 - 17.0 g/dL 13.3   Hematocrit 39.0 - 52.0 % 41.4   Platelets 150 - 400 K/uL 211       Assessment and plan- Patient is a 77 y.o. male here for routine follow-up of Rai stage 0 CLL  Patient's leukocytosis has been trending up consistently and his absolute lymphocyte doubling time is roughly 1 year.  No evidence of anemia or thrombocytopenia.  No B symptoms.  He has palpable bilateral nonbulky supraclavicular adenopathy which is chronic and stable.  CLL does not require any treatment at this time but given his consistent upward trend in his lymphocyte count I will repeat it in about 6 months and see him thereafter   Visit Diagnosis 1. CLL (chronic lymphocytic leukemia) ([place])      Dr. Randa Evens, MD, MPH [facility] at [facility] [id number] 01/07/2023 4:14 PM

## 2023-03-24 ENCOUNTER — Other Ambulatory Visit: Payer: Self-pay | Admitting: *Deleted

## 2023-03-24 ENCOUNTER — Telehealth: Payer: Self-pay | Admitting: *Deleted

## 2023-03-24 DIAGNOSIS — C9110 Chronic lymphocytic leukemia of B-cell type not having achieved remission: Secondary | ICD-10-CM

## 2023-03-24 NOTE — Telephone Encounter (Signed)
It is not urgent. Aundra Millet- I will see him week of July 7/8 sometime with cbc with diff

## 2023-03-24 NOTE — Telephone Encounter (Signed)
Call reporting that patient went to ER and was told to follow up with our office so she is asking for an appointment to be seen. Please advise

## 2023-04-09 ENCOUNTER — Other Ambulatory Visit: Payer: Medicare Other

## 2023-04-20 ENCOUNTER — Inpatient Hospital Stay: Payer: Medicare Other | Attending: Oncology

## 2023-04-20 ENCOUNTER — Encounter: Payer: Self-pay | Admitting: *Deleted

## 2023-04-20 ENCOUNTER — Inpatient Hospital Stay (HOSPITAL_BASED_OUTPATIENT_CLINIC_OR_DEPARTMENT_OTHER): Payer: Medicare Other | Admitting: Oncology

## 2023-04-20 ENCOUNTER — Encounter: Payer: Self-pay | Admitting: Oncology

## 2023-04-20 VITALS — BP 139/79 | HR 84 | Temp 97.2°F | Ht 68.0 in | Wt 168.4 lb

## 2023-04-20 DIAGNOSIS — C9110 Chronic lymphocytic leukemia of B-cell type not having achieved remission: Secondary | ICD-10-CM

## 2023-04-20 DIAGNOSIS — Z87891 Personal history of nicotine dependence: Secondary | ICD-10-CM | POA: Insufficient documentation

## 2023-04-20 DIAGNOSIS — Z79899 Other long term (current) drug therapy: Secondary | ICD-10-CM | POA: Insufficient documentation

## 2023-04-20 LAB — CBC WITH DIFFERENTIAL (CANCER CENTER ONLY)
Abs Immature Granulocytes: 0.12 10*3/uL — ABNORMAL HIGH (ref 0.00–0.07)
Basophils Absolute: 0.2 10*3/uL — ABNORMAL HIGH (ref 0.0–0.1)
Basophils Relative: 0 %
Eosinophils Absolute: 0.4 10*3/uL (ref 0.0–0.5)
Eosinophils Relative: 1 %
HCT: 36.7 % — ABNORMAL LOW (ref 39.0–52.0)
Hemoglobin: 11.6 g/dL — ABNORMAL LOW (ref 13.0–17.0)
Immature Granulocytes: 0 %
Lymphocytes Relative: 77 %
Lymphs Abs: 38.7 10*3/uL — ABNORMAL HIGH (ref 0.7–4.0)
MCH: 28.1 pg (ref 26.0–34.0)
MCHC: 31.6 g/dL (ref 30.0–36.0)
MCV: 88.9 fL (ref 80.0–100.0)
Monocytes Absolute: 5.7 10*3/uL — ABNORMAL HIGH (ref 0.1–1.0)
Monocytes Relative: 11 %
Neutro Abs: 5.4 10*3/uL (ref 1.7–7.7)
Neutrophils Relative %: 11 %
Platelet Count: 254 10*3/uL (ref 150–400)
RBC: 4.13 MIL/uL — ABNORMAL LOW (ref 4.22–5.81)
RDW: 14.4 % (ref 11.5–15.5)
Smear Review: NORMAL
WBC Count: 50.5 10*3/uL (ref 4.0–10.5)
nRBC: 0 % (ref 0.0–0.2)

## 2023-04-20 LAB — CMP (CANCER CENTER ONLY)
ALT: 13 U/L (ref 0–44)
AST: 17 U/L (ref 15–41)
Albumin: 3.8 g/dL (ref 3.5–5.0)
Alkaline Phosphatase: 95 U/L (ref 38–126)
Anion gap: 9 (ref 5–15)
BUN: 21 mg/dL (ref 8–23)
CO2: 29 mmol/L (ref 22–32)
Calcium: 9.3 mg/dL (ref 8.9–10.3)
Chloride: 99 mmol/L (ref 98–111)
Creatinine: 1.12 mg/dL (ref 0.61–1.24)
GFR, Estimated: >60 mL/min (ref >60)
Glucose, Bld: 125 mg/dL — ABNORMAL HIGH (ref 70–99)
Potassium: 4.4 mmol/L (ref 3.5–5.1)
Sodium: 137 mmol/L (ref 135–145)
Total Bilirubin: 0.4 mg/dL (ref 0.3–1.2)
Total Protein: 7.9 g/dL (ref 6.5–8.1)

## 2023-04-20 LAB — LACTATE DEHYDROGENASE: LDH: 118 U/L (ref 98–192)

## 2023-04-20 NOTE — Progress Notes (Signed)
Hematology/Oncology Consult note [facility]  [phone] Fax:([phone]  Patient Care Team: Gracelyn Nurse, MD as PCP - General (Internal Medicine)   Name of the patient: Andre Jackson  [id number]  Feb 14, 1946   Date of visit: 04/20/23  Diagnosis-Rai stage 0 CLL  Chief complaint/ Reason for visit-routine follow-up of CLL  Heme/Onc history: patient is a 77 year old African-American male with a past medical history significant for hypertension hyperlipidemia osteoarthritis and prostate cancer in the past.  He has been referred to [place] for lymphocytosis/leukocytosis.His most recent CBC from 04/01/2021 showed a white count of 12.6, H&H of 13.5/41.9 with an MCV of 90.5 and a platelet count of 199.  Looking back at his CBC is over the last 5 years his white count has mostly remained normal between 7-9 but differential has shown some intermittent lymphocytosis in the past as well.     Results of blood work from 04/23/2021 showed a mildly elevated white count of 11.7 with an absolute lymphocyte count of 8.3.  Flow cytometry showed CD5 positive CD23 positive and FMC CD7 weakly positive B-cell lymphoproliferative disorder cells were dim positive for CD20 and FMC7.  CD103 negative.  Findings consistent with atypical CLL/SLL.  Mantle cell lymphoma not ruled out.   CLL has not required any treatment so far   Interval history-patient had a bout of upper respiratory infection a month ago requiring antibiotics.  He required about 3 weeks to recuperate.  He is now feeling better.  Appetite and weight have remained stable.  ECOG PS- 1 Pain scale- 0   Review of systems- Review of Systems  Constitutional:  Negative for chills, fever, malaise/fatigue and weight loss.  HENT:  Negative for congestion, ear discharge and nosebleeds.   Eyes:  Negative for blurred vision.  Respiratory:  Negative for cough, hemoptysis, sputum production, shortness of breath and  wheezing.   Cardiovascular:  Negative for chest pain, palpitations, orthopnea and claudication.  Gastrointestinal:  Negative for abdominal pain, blood in stool, constipation, diarrhea, heartburn, melena, nausea and vomiting.  Genitourinary:  Negative for dysuria, flank pain, frequency, hematuria and urgency.  Musculoskeletal:  Negative for back pain, joint pain and myalgias.  Skin:  Negative for rash.  Neurological:  Negative for dizziness, tingling, focal weakness, seizures, weakness and headaches.  Endo/Heme/Allergies:  Does not bruise/bleed easily.  Psychiatric/Behavioral:  Negative for depression and suicidal ideas. The patient does not have insomnia.       No Known Allergies   Past Medical History:  Diagnosis Date   Arthritis    "knees, hips" (04/03/2015)   Cataract immature    Diverticulosis    ED (erectile dysfunction)    History of kidney stones 2016   Hx of colonic polyps    Hyperlipidemia    takes Lipitor daily   Hypertension    takes Prinizide daily   Joint pain    Joint swelling    Left knee DJD    OA (osteoarthritis)    Prostate cancer ([facility])    Seasonal allergies    takes Allegra daily     Past Surgical History:  Procedure Laterality Date   BACK SURGERY     COLONOSCOPY W/ BIOPSIES AND POLYPECTOMY  07/2011   "have had it once before that also"   I & D EXTREMITY Left ~ 2010   knee   JOINT REPLACEMENT     KNEE ARTHROSCOPY Left ~ 2011   LIPOMA EXCISION  07/2011   neck   LUMBAR DISC  SURGERY  1970's   MULTIPLE TOOTH EXTRACTIONS  ~ 2007   REPLACEMENT TOTAL KNEE Right 04/05/12   REVERSE SHOULDER ARTHROPLASTY Left 12/02/2017   Procedure: REVERSE SHOULDER ARTHROPLASTY;  Surgeon: Bjorn Pippin, MD;  [facility];  Service: Orthopedics;  Laterality: Left;   TONSILLECTOMY AND ADENOIDECTOMY  1940's   TOTAL HIP ARTHROPLASTY Left 04/03/2015   TOTAL HIP ARTHROPLASTY Left 04/03/2015   Procedure: TOTAL HIP ARTHROPLASTY ANTERIOR APPROACH;  Surgeon: Sheral Apley,  MD;  Location[facility];  Service: Orthopedics;  Laterality: Left;   TOTAL HIP ARTHROPLASTY Right 05/10/2019   Procedure: TOTAL HIP ARTHROPLASTY ANTERIOR APPROACH;  Surgeon: Sheral Apley, MD;  [facility];  Service: Orthopedics;  Laterality: Right;   TOTAL KNEE ARTHROPLASTY  04/05/2012   Procedure: TOTAL KNEE ARTHROPLASTY;  Surgeon: Nilda Simmer, MD;  [facility];  Service: Orthopedics;  Laterality: Right;  DR Thurston Hole WANTS 90 MINUTES FOR THIS CASE   TOTAL KNEE ARTHROPLASTY  08/02/2012   Procedure: TOTAL KNEE ARTHROPLASTY;  Surgeon: Nilda Simmer, MD;  [facility];  Service: Orthopedics;  Laterality: Left;    Social History   Socioeconomic History   Marital status: Married    Spouse name: Not on file   Number of children: Not on file   Years of education: Not on file   Highest education level: Not on file  Occupational History   Occupation: retired  Tobacco Use   Smoking status: Former    Packs/day: 0.33    Years: 44.00    Additional pack years: 0.00    Total pack years: 14.52    Types: Cigarettes    Quit date: 02/10/2009    Years since quitting: 14.1    Passive exposure: Past   Smokeless tobacco: Never  Vaping Use   Vaping Use: Never used  Substance and Sexual Activity   Alcohol use: Yes    Comment: occasional   Drug use: No   Sexual activity: Yes  Other Topics Concern   Not on file  Social History Narrative   Not on file   Social Determinants of Health   Financial Resource Strain: Not on file  Food Insecurity: Not on file  Transportation Needs: Not on file  Physical Activity: Not on file  Stress: Not on file  Social Connections: Not on file  Intimate Partner Violence: Not on file    Family History  Problem Relation Age of Onset   Cerebral aneurysm Mother    Hypertension Mother    Hypertension Sister    Hypertension Brother    Prostate cancer Neg Hx      Current Outpatient Medications:    acetaminophen (TYLENOL) 500 MG tablet, Take 500  mg by mouth daily as needed for moderate pain., Disp: , Rfl:    lisinopril-hydrochlorothiazide (PRINZIDE,ZESTORETIC) 20-25 MG per tablet, Take 1 tablet by mouth daily., Disp: , Rfl:    Multiple Vitamin (MULTIVITAMIN WITH MINERALS) TABS tablet, Take 1 tablet by mouth daily., Disp: , Rfl:    pantoprazole (PROTONIX) 40 MG tablet, Take 40 mg by mouth daily., Disp: , Rfl:    sildenafil (REVATIO) 20 MG tablet, Take 20-100mg  by mouth as needed prior to intercourse, Disp: 30 tablet, Rfl: 11   aspirin EC 81 MG tablet, Take 1 tablet (81 mg total) by mouth 2 (two) times daily. For DVT prophylaxis for 30 days after surgery. (Patient not taking: Reported on 07/08/2022), Disp: 60 tablet, Rfl: 0   atorvastatin (LIPITOR) 10 MG tablet, Take 10 mg  by mouth daily. (Patient not taking: Reported on 01/07/2023), Disp: , Rfl:    meloxicam (MOBIC) 15 MG tablet, Take 15 mg by mouth daily. (Patient not taking: Reported on 07/08/2022), Disp: , Rfl:   Physical exam:  Vitals:   04/20/23 1117  BP: 139/79  Pulse: 84  Temp: (!) 97.2 F (36.2 C)  TempSrc: Tympanic  SpO2: 98%  Weight: 168 lb 6.4 oz (76.4 kg)  Height: 5\' 8"  (1.727 m)   Physical Exam Cardiovascular:     Rate and Rhythm: Normal rate and regular rhythm.     Heart sounds: Normal heart sounds.  Pulmonary:     Effort: Pulmonary effort is normal.     Breath sounds: Normal breath sounds.  Abdominal:     General: Bowel sounds are normal.     Palpations: Abdomen is soft.     Comments: No palpable hepatosplenomegaly  Lymphadenopathy:     Comments: No palpable cervical, supraclavicular, axillary or inguinal adenopathy    Skin:    General: Skin is warm and dry.  Neurological:     Mental Status: He is alert and oriented to person, place, and time.         Latest Ref Rng & Units 04/20/2023   11:15 AM  CMP  Glucose 70 - 99 mg/dL 161   BUN 8 - 23 mg/dL 21   Creatinine 0.96 - 1.24 mg/dL 0.45   Sodium 409 - 811 mmol/L 137   Potassium 3.5 - 5.1 mmol/L 4.4    Chloride 98 - 111 mmol/L 99   CO2 22 - 32 mmol/L 29   Calcium 8.9 - 10.3 mg/dL 9.3   Total Protein 6.5 - 8.1 g/dL 7.9   Total Bilirubin 0.3 - 1.2 mg/dL 0.4   Alkaline Phos 38 - 126 U/L 95   AST 15 - 41 U/L 17   ALT 0 - 44 U/L 13       Latest Ref Rng & Units 04/20/2023   11:15 AM  CBC  WBC 4.0 - 10.5 K/uL 50.5   Hemoglobin 13.0 - 17.0 g/dL 91.4   Hematocrit 78.2 - 52.0 % 36.7   Platelets 150 - 400 K/uL 254     No images are attached to the encounter.  No results found.   Assessment and plan- Patient is a 77 y.o. male with history of Rai stage 0 CLL here for routine follow-up  Patient does not have any overt B symptoms.  He has mildly increasing lymphocytosis but overall absolute lymphocyte doubling time remains close to 1 year.  He has mild anemia today at hemoglobin of 11.6 possibly secondary to his recent respiratory infection.  Platelet counts remain normal.  No palpable adenopathy or symptomatically.  He does not require any treatment for CLL at this time.  Repeat CBC with differential in 3 months in 6 months and I will see him back in 6 months   Visit Diagnosis 1. CLL (chronic lymphocytic leukemia) ([facility])      Dr. Owens Shark, MD, MPH [facility] at [facility] [phone] 04/20/2023 4:01 PM

## 2023-04-20 NOTE — Progress Notes (Signed)
Requesting refill of mobic.

## 2023-06-05 ENCOUNTER — Other Ambulatory Visit: Payer: Medicare Other

## 2023-06-05 DIAGNOSIS — C61 Malignant neoplasm of prostate: Secondary | ICD-10-CM

## 2023-06-06 LAB — PSA: Prostate Specific Ag, Serum: <0.1 ng/mL (ref 0.0–4.0)

## 2023-06-11 ENCOUNTER — Ambulatory Visit: Payer: Medicare Other | Admitting: Urology

## 2023-06-11 ENCOUNTER — Encounter: Payer: Self-pay | Admitting: Urology

## 2023-06-11 VITALS — BP 136/78 | HR 76 | Ht 68.0 in | Wt 168.0 lb

## 2023-06-11 DIAGNOSIS — C61 Malignant neoplasm of prostate: Secondary | ICD-10-CM | POA: Diagnosis not present

## 2023-06-11 DIAGNOSIS — N529 Male erectile dysfunction, unspecified: Secondary | ICD-10-CM | POA: Diagnosis not present

## 2023-06-11 MED ORDER — SILDENAFIL CITRATE 100 MG PO TABS: 100.0000 mg | ORAL_TABLET | Freq: Every day | ORAL | 11 refills | Status: DC | PRN

## 2023-06-11 NOTE — Progress Notes (Signed)
   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    He completed external beam radiation therapy with Dr. Rushie Chestnut in spring 2020.  He continues to do extremely well since and denies any significant urinary symptoms.    PSA remains very low and stable, <0.1 this year from 0.1 last year, indicating excellent control of his prostate cancer.  We discussed the need for ongoing surveillance and the risk of recurrence.  He reports the sildenafil 40 to 60 mg on demand has not been as effective lately.  I recommended increasing to 100 mg as needed, and we could trial Cialis if no improvement.  He also had a episode of epididymitis with dysuria and hematuria that resolved with antibiotics.  Denies any recurrent urinary symptoms since that time.  -Sildenafil increased to 100 mg on demand -RTC 1 year PSA prior  Sondra Come, MD  [facility] [address] [place], [place] [address] [phone]

## 2023-07-10 ENCOUNTER — Ambulatory Visit: Payer: Medicare Other | Admitting: Oncology

## 2023-07-10 ENCOUNTER — Telehealth: Payer: Self-pay | Admitting: *Deleted

## 2023-07-10 ENCOUNTER — Other Ambulatory Visit: Payer: Self-pay | Admitting: *Deleted

## 2023-07-10 ENCOUNTER — Inpatient Hospital Stay: Payer: Medicare Other | Attending: Oncology

## 2023-07-10 DIAGNOSIS — C9110 Chronic lymphocytic leukemia of B-cell type not having achieved remission: Secondary | ICD-10-CM | POA: Diagnosis present

## 2023-07-10 LAB — CBC WITH DIFFERENTIAL/PLATELET
Abs Immature Granulocytes: 0.15 10*3/uL — ABNORMAL HIGH (ref 0.00–0.07)
Basophils Absolute: 0.3 10*3/uL — ABNORMAL HIGH (ref 0.0–0.1)
Basophils Relative: 1 %
Eosinophils Absolute: 0.3 10*3/uL (ref 0.0–0.5)
Eosinophils Relative: 0 %
HCT: 39.2 % (ref 39.0–52.0)
Hemoglobin: 12.2 g/dL — ABNORMAL LOW (ref 13.0–17.0)
Immature Granulocytes: 0 %
Lymphocytes Relative: 73 %
Lymphs Abs: 40.3 10*3/uL — ABNORMAL HIGH (ref 0.7–4.0)
MCH: 27.9 pg (ref 26.0–34.0)
MCHC: 31.1 g/dL (ref 30.0–36.0)
MCV: 89.5 fL (ref 80.0–100.0)
Monocytes Absolute: 9.7 10*3/uL — ABNORMAL HIGH (ref 0.1–1.0)
Monocytes Relative: 17 %
Neutro Abs: 5 10*3/uL (ref 1.7–7.7)
Neutrophils Relative %: 9 %
Platelets: 222 10*3/uL (ref 150–400)
RBC: 4.38 MIL/uL (ref 4.22–5.81)
RDW: 14.9 % (ref 11.5–15.5)
WBC: 55.7 10*3/uL (ref 4.0–10.5)
nRBC: 0 % (ref 0.0–0.2)

## 2023-07-10 NOTE — Telephone Encounter (Signed)
Dr Janese Banks was notified .

## 2023-10-21 ENCOUNTER — Ambulatory Visit: Payer: Medicare Other | Admitting: Oncology

## 2023-10-21 ENCOUNTER — Other Ambulatory Visit: Payer: Medicare Other

## 2023-10-27 ENCOUNTER — Other Ambulatory Visit: Payer: Self-pay

## 2023-10-27 DIAGNOSIS — C9110 Chronic lymphocytic leukemia of B-cell type not having achieved remission: Secondary | ICD-10-CM

## 2023-10-28 ENCOUNTER — Inpatient Hospital Stay: Payer: Medicare Other | Admitting: Oncology

## 2023-10-28 ENCOUNTER — Inpatient Hospital Stay: Payer: Medicare Other | Attending: Oncology

## 2023-10-28 ENCOUNTER — Encounter: Payer: Self-pay | Admitting: Oncology

## 2023-10-28 VITALS — BP 133/77 | HR 80 | Temp 98.3°F | Resp 16 | Ht 68.0 in | Wt 169.0 lb

## 2023-10-28 DIAGNOSIS — C9110 Chronic lymphocytic leukemia of B-cell type not having achieved remission: Secondary | ICD-10-CM | POA: Diagnosis not present

## 2023-10-28 DIAGNOSIS — E785 Hyperlipidemia, unspecified: Secondary | ICD-10-CM | POA: Insufficient documentation

## 2023-10-28 DIAGNOSIS — I10 Essential (primary) hypertension: Secondary | ICD-10-CM | POA: Insufficient documentation

## 2023-10-28 LAB — CBC WITH DIFFERENTIAL (CANCER CENTER ONLY)
Abs Immature Granulocytes: 0 10*3/uL (ref 0.00–0.07)
Basophils Absolute: 0 10*3/uL (ref 0.0–0.1)
Basophils Relative: 0 %
Eosinophils Absolute: 0 10*3/uL (ref 0.0–0.5)
Eosinophils Relative: 0 %
HCT: 41.4 % (ref 39.0–52.0)
Hemoglobin: 13.2 g/dL (ref 13.0–17.0)
Lymphocytes Relative: 87 %
Lymphs Abs: 41.3 10*3/uL — ABNORMAL HIGH (ref 0.7–4.0)
MCH: 27.9 pg (ref 26.0–34.0)
MCHC: 31.9 g/dL (ref 30.0–36.0)
MCV: 87.5 fL (ref 80.0–100.0)
Monocytes Absolute: 1.4 10*3/uL — ABNORMAL HIGH (ref 0.1–1.0)
Monocytes Relative: 3 %
Neutro Abs: 4.8 10*3/uL (ref 1.7–7.7)
Neutrophils Relative %: 10 %
Platelet Count: 177 10*3/uL (ref 150–400)
RBC: 4.73 MIL/uL (ref 4.22–5.81)
RDW: 15.2 % (ref 11.5–15.5)
Smear Review: NORMAL
WBC Count: 47.5 10*3/uL — ABNORMAL HIGH (ref 4.0–10.5)
nRBC: 0 % (ref 0.0–0.2)

## 2023-10-28 LAB — CMP (CANCER CENTER ONLY)
ALT: 14 U/L (ref 0–44)
AST: 17 U/L (ref 15–41)
Albumin: 4.1 g/dL (ref 3.5–5.0)
Alkaline Phosphatase: 76 U/L (ref 38–126)
Anion gap: 11 (ref 5–15)
BUN: 23 mg/dL (ref 8–23)
CO2: 29 mmol/L (ref 22–32)
Calcium: 9.6 mg/dL (ref 8.9–10.3)
Chloride: 99 mmol/L (ref 98–111)
Creatinine: 1 mg/dL (ref 0.61–1.24)
GFR, Estimated: >60 mL/min (ref >60)
Glucose, Bld: 107 mg/dL — ABNORMAL HIGH (ref 70–99)
Potassium: 3.7 mmol/L (ref 3.5–5.1)
Sodium: 139 mmol/L (ref 135–145)
Total Bilirubin: 0.8 mg/dL (ref 0.0–1.2)
Total Protein: 7.8 g/dL (ref 6.5–8.1)

## 2023-10-29 NOTE — Progress Notes (Signed)
Hematology/Oncology Consult note [facility]  [phone] [phone]  Patient Care Team: Gracelyn Nurse, MD as PCP - General (Internal Medicine)   Name of the patient: Andre Jackson  [id number]  1946-04-28   Date of visit: 10/29/23  Diagnosis- Rai stage 0 CLL  Chief complaint/ Reason for visit- routine f/u of CLL  Heme/Onc history:  patient is a 78 year old African-American male with a past medical history significant for hypertension hyperlipidemia osteoarthritis and prostate cancer in the past.  He has been referred to [place] for lymphocytosis/leukocytosis.His most recent CBC from 04/01/2021 showed a white count of 12.6, H&H of 13.5/41.9 with an MCV of 90.5 and a platelet count of 199.  Looking back at his CBC is over the last 5 years his white count has mostly remained normal between 7-9 but differential has shown some intermittent lymphocytosis in the past as well.     Results of blood work from 04/23/2021 showed a mildly elevated white count of 11.7 with an absolute lymphocyte count of 8.3.  Flow cytometry showed CD5 positive CD23 positive and FMC CD7 weakly positive B-cell lymphoproliferative disorder cells were dim positive for CD20 and FMC7.  CD103 negative.  Findings consistent with atypical CLL/SLL.  Mantle cell lymphoma not ruled out.   CLL has not required any treatment so far   Interval history- Overall he is doing well. No changes in his appetite or weight. Denies any lumps anywhere  ECOG PS- 1 Pain scale- 0   Review of systems- Review of Systems  Constitutional:  Negative for chills, fever, malaise/fatigue and weight loss.  HENT:  Negative for congestion, ear discharge and nosebleeds.   Eyes:  Negative for blurred vision.  Respiratory:  Negative for cough, hemoptysis, sputum production, shortness of breath and wheezing.   Cardiovascular:  Negative for chest pain, palpitations, orthopnea and claudication.  Gastrointestinal:   Negative for abdominal pain, blood in stool, constipation, diarrhea, heartburn, melena, nausea and vomiting.  Genitourinary:  Negative for dysuria, flank pain, frequency, hematuria and urgency.  Musculoskeletal:  Negative for back pain, joint pain and myalgias.  Skin:  Negative for rash.  Neurological:  Negative for dizziness, tingling, focal weakness, seizures, weakness and headaches.  Endo/Heme/Allergies:  Does not bruise/bleed easily.  Psychiatric/Behavioral:  Negative for depression and suicidal ideas. The patient does not have insomnia.       No Known Allergies   Past Medical History:  Diagnosis Date   Arthritis    "knees, hips" (04/03/2015)   Cataract immature    Diverticulosis    ED (erectile dysfunction)    History of kidney stones 2016   Hx of colonic polyps    Hyperlipidemia    takes Lipitor daily   Hypertension    takes Prinizide daily   Joint pain    Joint swelling    Left knee DJD    OA (osteoarthritis)    Prostate cancer (HCC)    Seasonal allergies    takes Allegra daily     Past Surgical History:  Procedure Laterality Date   BACK SURGERY     COLONOSCOPY W/ BIOPSIES AND POLYPECTOMY  07/2011   "have had it once before that also"   I & D EXTREMITY Left ~ 2010   knee   JOINT REPLACEMENT     KNEE ARTHROSCOPY Left ~ 2011   LIPOMA EXCISION  07/2011   neck   LUMBAR DISC SURGERY  1970's   MULTIPLE TOOTH EXTRACTIONS  ~ 2007   REPLACEMENT  TOTAL KNEE Right 04/05/12   REVERSE SHOULDER ARTHROPLASTY Left 12/02/2017   Procedure: REVERSE SHOULDER ARTHROPLASTY;  Surgeon: Bjorn Pippin, MD;  Location: [facility] OR;  Service: Orthopedics;  Laterality: Left;   TONSILLECTOMY AND ADENOIDECTOMY  1940's   TOTAL HIP ARTHROPLASTY Left 04/03/2015   TOTAL HIP ARTHROPLASTY Left 04/03/2015   Procedure: TOTAL HIP ARTHROPLASTY ANTERIOR APPROACH;  Surgeon: Sheral Apley, MD;  Location: [facility] OR;  Service: Orthopedics;  Laterality: Left;   TOTAL HIP ARTHROPLASTY Right 05/10/2019   Procedure:  TOTAL HIP ARTHROPLASTY ANTERIOR APPROACH;  Surgeon: Sheral Apley, MD;  Location: [facility] OR[facility];  Service: Orthopedics;  Laterality: Right;   TOTAL KNEE ARTHROPLASTY  04/05/2012   Procedure: TOTAL KNEE ARTHROPLASTY;  Surgeon: Nilda Simmer, MD;  Location: [facility] OR;  Service: Orthopedics;  Laterality: Right;  DR Thurston Hole WANTS 90 MINUTES FOR THIS CASE   TOTAL KNEE ARTHROPLASTY  08/02/2012   Procedure: TOTAL KNEE ARTHROPLASTY;  Surgeon: Nilda Simmer, MD;  Location: [facility] OR;  Service: Orthopedics;  Laterality: Left;    Social History   Socioeconomic History   Marital status: Married    Spouse name: Not on file   Number of children: Not on file   Years of education: Not on file   Highest education level: Not on file  Occupational History   Occupation: retired  Tobacco Use   Smoking status: Former    Current packs/day: 0.00    Average packs/day: 0.3 packs/day for 44.0 years (14.5 ttl pk-yrs)    Types: Cigarettes    Start date: 02/10/1965    Quit date: 02/10/2009    Years since quitting: 14.7    Passive exposure: Past   Smokeless tobacco: Never  Vaping Use   Vaping status: Never Used  Substance and Sexual Activity   Alcohol use: Yes    Comment: occasional   Drug use: No   Sexual activity: Yes  Other Topics Concern   Not on file  Social History Narrative   Not on file   Social Drivers of Health   Financial Resource Strain: Not on file  Food Insecurity: Not on file  Transportation Needs: Not on file  Physical Activity: Not on file  Stress: Not on file  Social Connections: Not on file  Intimate Partner Violence: Not on file    Family History  Problem Relation Age of Onset   Cerebral aneurysm Mother    Hypertension Mother    Hypertension Sister    Hypertension Brother    Prostate cancer Neg Hx      Current Outpatient Medications:    acetaminophen (TYLENOL) 500 MG tablet, Take 500 mg by mouth daily as needed for moderate pain., Disp: , Rfl:    atorvastatin (LIPITOR) 10 MG  tablet, Take 10 mg by mouth daily., Disp: , Rfl:    lisinopril-hydrochlorothiazide (PRINZIDE,ZESTORETIC) 20-25 MG per tablet, Take 1 tablet by mouth daily., Disp: , Rfl:    meloxicam (MOBIC) 15 MG tablet, Take 15 mg by mouth daily., Disp: , Rfl:    Multiple Vitamin (MULTIVITAMIN WITH MINERALS) TABS tablet, Take 1 tablet by mouth daily., Disp: , Rfl:    omeprazole (PRILOSEC) 40 MG capsule, Take 40 mg by mouth daily., Disp: , Rfl:    sildenafil (VIAGRA) 100 MG tablet, Take 1 tablet (100 mg total) by mouth daily as needed for erectile dysfunction (take 30 minutes prior to sexual activity on an empty stomach)., Disp: 30 tablet, Rfl: 11  Physical exam:  Vitals:   10/28/23 1404  BP:  133/77  Pulse: 80  Resp: 16  Temp: 98.3 F (36.8 C)  TempSrc: Tympanic  SpO2: 99%  Weight: 169 lb (76.7 kg)  Height: 5\' 8"  (1.727 m)   Physical Exam Cardiovascular:     Rate and Rhythm: Normal rate and regular rhythm.     Heart sounds: Normal heart sounds.  Pulmonary:     Effort: Pulmonary effort is normal.     Breath sounds: Normal breath sounds.  Abdominal:     General: Bowel sounds are normal.     Palpations: Abdomen is soft.  Lymphadenopathy:     Comments: Palpable b/l non bulky cervical and inguinal adenopathy  Skin:    General: Skin is warm and dry.  Neurological:     Mental Status: He is alert and oriented to person, place, and time.         Latest Ref Rng & Units 10/28/2023    1:52 PM  CMP  Glucose 70 - 99 mg/dL 161   BUN 8 - 23 mg/dL 23   Creatinine 0.96 - 1.24 mg/dL 0.45   Sodium 409 - 811 mmol/L 139   Potassium 3.5 - 5.1 mmol/L 3.7   Chloride 98 - 111 mmol/L 99   CO2 22 - 32 mmol/L 29   Calcium 8.9 - 10.3 mg/dL 9.6   Total Protein 6.5 - 8.1 g/dL 7.8   Total Bilirubin 0.0 - 1.2 mg/dL 0.8   Alkaline Phos 38 - 126 U/L 76   AST 15 - 41 U/L 17   ALT 0 - 44 U/L 14       Latest Ref Rng & Units 10/28/2023    1:52 PM  CBC  WBC 4.0 - 10.5 K/uL 47.5   Hemoglobin 13.0 - 17.0 g/dL 91.4    Hematocrit 78.2 - 52.0 % 41.4   Platelets 150 - 400 K/uL 177     Assessment and plan- Patient is a 78 y.o. male history of Rai stage 0 CLL here for routine follow-up  On physical exam patient has palpable nonbulky cervical or inguinal adenopathy.  He does not report any B symptoms.  He does not have any palpable splenomegaly.  Hemoglobin and platelet count have been normal.  He has had chronic leukocytosis/lymphocytosis which has increased over the years but lymphocyte doubling time remains more than 6 months.  His CLL does not require any treatment at this time.  CBC with differential in 6 months in 1 year and I will see him back in 1 year   Visit Diagnosis 1. CLL (chronic lymphocytic leukemia) (HCC)      Dr. Owens Shark, MD, MPH [facility] [phone] 10/29/2023 6:29 PM

## 2023-12-10 NOTE — Progress Notes (Signed)
 Chief Complaint:   Chief Complaint  Patient presents with  . Annual Exam    Subjective:   Andre Jackson is a 78 y.o. male in today for his annual physical exam.  Current Outpatient Medications  Medication Sig Dispense Refill  . aspirin  81 MG EC tablet Take 81 mg by mouth once daily.    . atorvastatin  (LIPITOR) 10 MG tablet take 1 tablet by mouth every day 90 tablet 1  . lisinopriL -hydroCHLOROthiazide  (ZESTORETIC ) 20-25 mg tablet take 1 tablet by mouth every day 90 tablet 1  . meloxicam  (MOBIC ) 15 MG tablet TAKE 1 TABLET BY MOUTH EVERY DAY 90 tablet 1  . omeprazole  (PRILOSEC) 40 MG DR capsule Take 1 capsule (40 mg total) by mouth once daily 30 capsule 11  . azelastine (ASTELIN) 137 mcg nasal spray Place 1 spray into both nostrils 2 (two) times daily (Patient not taking: Reported on 12/10/2023) 30 mL 11  . fluticasone propionate (FLONASE) 50 mcg/actuation nasal spray SPRAY 2 SPRAYS INTO EACH NOSTRIL EVERY DAY (Patient not taking: Reported on 12/10/2023) 16 g 1   No current facility-administered medications for this visit.    Allergies as of 12/10/2023  . (No Known Allergies)    Past Medical History:  Diagnosis Date  . Allergic state    Seasonal  . Erectile dysfunction     Past Surgical History:  Procedure Laterality Date  . REPLACEMENT TOTAL HIP W/  RESURFACING IMPLANTS Left 04/03/2015  . JOINT REPLACEMENT Bilateral    knees  . KNEE ARTHROSCOPY Bilateral   . TONSILLECTOMY       Family History  Problem Relation Name Age of Onset  . High blood pressure (Hypertension) Father      Social History:  reports that he quit smoking about 55 years ago. His smoking use included cigarettes. He started smoking about 63 years ago. He has a 4 pack-year smoking history. He has never used smokeless tobacco. He reports current alcohol use. He reports that he does not use drugs.  Results for orders placed or performed in visit on 12/04/23  Comprehensive Metabolic Panel (CMP)  Result  Value Ref Range   Glucose 96 70 - 110 mg/dL   Sodium 856 863 - 854 mmol/L   Potassium 4.5 3.6 - 5.1 mmol/L   Chloride 102 97 - 109 mmol/L   Carbon Dioxide (CO2) 32.6 (H) 22.0 - 32.0 mmol/L   Urea Nitrogen (BUN) 20 7 - 25 mg/dL   Creatinine 1.2 0.7 - 1.3 mg/dL   Glomerular Filtration Rate (eGFR) 62 >60 mL/min/1.73sq m   Calcium  10.3 8.7 - 10.3 mg/dL   AST  17 8 - 39 U/L   ALT  12 6 - 57 U/L   Alk Phos (alkaline Phosphatase) 86 34 - 104 U/L   Albumin  4.4 3.5 - 4.8 g/dL   Bilirubin, Total 0.5 0.3 - 1.2 mg/dL   Protein, Total 7.8 6.1 - 7.9 g/dL   A/G Ratio 1.3 1.0 - 5.0 gm/dL  CBC w/auto Differential (5 Part)  Result Value Ref Range   WBC (White Blood Cell Count) 59.2 (H) 4.1 - 10.2 10^3/uL   RBC (Red Blood Cell Count) 4.43 (L) 4.69 - 6.13 10^6/uL   Hemoglobin 12.5 (L) 14.1 - 18.1 gm/dL   Hematocrit 60.1 (L) 59.9 - 52.0 %   MCV (Mean Corpuscular Volume) 89.8 80.0 - 100.0 fl   MCH (Mean Corpuscular Hemoglobin) 28.2 27.0 - 31.2 pg   MCHC (Mean Corpuscular Hemoglobin Concentration) 31.4 (L) 32.0 - 36.0 gm/dL  Platelet Count 203 150 - 450 10^3/uL   RDW-CV (Red Cell Distribution Width) 14.9 (H) 11.6 - 14.8 %   MPV (Mean Platelet Volume) 10.7 9.4 - 12.4 fl   Neutrophils 5.27 1.50 - 7.80 10^3/uL   Lymphocytes 43.59 (H) 1.00 - 3.60 10^3/uL   Monocytes 9.79 (H) 0.00 - 1.50 10^3/uL   Eosinophils 0.15 0.00 - 0.55 10^3/uL   Basophils 0.22 (H) 0.00 - 0.09 10^3/uL   Neutrophil % 8.8 (L) 32.0 - 70.0 %   Lymphocyte % 73.7 (H) 10.0 - 50.0 %   Monocyte % 16.5 (H) 4.0 - 13.0 %   Eosinophil % 0.3 (L) 1.0 - 5.0 %   Basophil% 0.4 0.0 - 2.0 %   Immature Granulocyte % 0.3 <=0.7 %   Immature Granulocyte Count 0.15 (H) <=0.06 10^3/L  Lipid Panel w/calc LDL  Result Value Ref Range   Cholesterol, Total 171 100 - 200 mg/dL   Triglyceride 838 35 - 199 mg/dL   HDL (High Density Lipoprotein) Cholesterol 40.3 29.0 - 71.0 mg/dL   LDL Calculated 99 0 - 130 mg/dL   VLDL Cholesterol 32 mg/dL    Cholesterol/HDL Ratio 4.2   PSA, Total (Screen)  Result Value Ref Range   PSA (Prostate Specific Antigen), Total 0.06 (L) 0.10 - 4.00 ng/mL   Narrative   Test results were determined with Beckman Coulter Hybritech Assay. Values obtained with different assay methods cannot be used interchangeably in serial testing. Assay results should not be interpreted as absolute evidence of the presence or absence of malignant disease  Urinalysis w/Microscopic  Result Value Ref Range   Color Yellow Colorless, Straw, Light Yellow, Yellow, Dark Yellow   Clarity Clear Clear   Specific Gravity 1.019 1.005 - 1.030   pH, Urine 6.0 5.0 - 8.0   Protein, Urinalysis Negative Negative mg/dL   Glucose, Urinalysis Negative Negative mg/dL   Ketones, Urinalysis Negative Negative mg/dL   Blood, Urinalysis 1+ (!) Negative   Nitrite, Urinalysis Negative Negative   Leukocyte Esterase, Urinalysis Negative Negative   Bilirubin, Urinalysis Negative Negative   Urobilinogen, Urinalysis 0.2 0.2 - 1.0 mg/dL   WBC, UA 1 <=5 /hpf   Red Blood Cells, Urinalysis 9 (H) <=3 /hpf   Bacteria, Urinalysis 0-5 0 - 5 /hpf   Squamous Epithelial Cells, Urinalysis 0 /hpf  Hemoglobin A1C  Result Value Ref Range   Hemoglobin A1C 6.2 (H) 4.2 - 5.6 %   Average Blood Glucose (Calc) 131 mg/dL   Narrative   Normal Range:    4.2 - 5.6% Increased Risk:  5.7 - 6.4% Diabetes:        >= 6.5% Glycemic Control for adults with diabetes:  <7%    Manual Differential  Result Value Ref Range   Segmented Neutrophil 11 10 - 50 %   Band 0 0 - 6 %   Lymphocyte 86 (H) 8 - 10 %   Monocytes 3 (L) 4 - 13 %   Eosinophil 0 (L) 1 - 5 %   Basophils 0 0 - 4 %      ROS:  General: No fever, chills or recent illness. No change in weight Skin:   No skin lesions, growths, masses, rashes, pruritus  HEENT: No change in vision or hearing. No pain or difficulty with swallowing Respiratory: No cough or shortness of breath CV:  No chest pain or  palpitations GI:  No pain, dyspepsia or change in bowel habits GU:  No dysuria, frequency, or hesitancy MSK:  No joint pain  or injury Neurological: No headaches, changes in mental status, loss of sensation or strength Endocrine:  No heat or cold intolerance, polydipsia, polyuria  Objective:   Body mass index is 26.72 kg/m.  BP 130/78   Pulse 89   Ht 170.2 cm (5' 7)   Wt 77.4 kg (170 lb 9.6 oz)   SpO2 98%   BMI 26.72 kg/m   General: WD/WN male, in no acute distress HEENT: Pupils equal and round, EOMI. oral mucosa moist.  Oropharynx clear. Neck: supple, trachea midline; no thyromegaly Respiratory:clear to auscultation.  No dullness to percussion.  No use of accessory muscles. Cardiac:  Regular rate and rhythm without murmur, gallops, or rubs Vascular: Carotid and radials 2+; distal pulses 2+ Abdominal:soft, nontender, positive bowel sounds.  No organomegaly. Musculoskeletal:  No clubbing, cyanosis or edema.  Full range of motion in upper and lower extremities bilaterally. Neuro: CN grossly intact.  No acute decrease in sensation in the upper and lower extremities bilaterally. Integumental: Moist with no significant rashes or nodules Lymph: no cervical or supraclavicular lymphadenopathy   Assessment/Plan:   Encounter for routine adult physical exam with abnormal findings  (primary encounter diagnosis) Essential hypertension, benign Hypercholesteremia Prediabetes Gastroesophageal reflux disease without esophagitis CLL (chronic lymphocytic leukemia) (CMS/HHS-HCC)  Assessment and Plan  1.  Annual physical exam.  Patient is up-to-date for health maintenance for his age.  Reviewed lab results. 2.  Hypertension.  Well-controlled. 3.  Hyperlipidemia.  Continue statin therapy. 4.  Prediabetes.  Hemoglobin A1c is 6.2. 5.  GERD.  PPI as needed. 6.  CLL.  Patient is being followed at the cancer center.    Goals     .  Maintain health/healthy lifestyle    . * Maintain  health/healthy lifestyle (pt-stated)    . * Maintain health/healthy lifestyle (pt-stated)       Goals       Patient Stated   . * Maintain health/healthy lifestyle (pt-stated)    . * Patient Goals (pt-stated)      Patient declined      Other   .  Maintain health/healthy lifestyle           . * Patient Goals (pt-stated)      Patient declined        Andre ALM ROWER, MD  Portions of this note were created using dictation software and may contain typographical errors.  *Some images could not be shown.

## 2024-04-26 ENCOUNTER — Telehealth: Payer: Self-pay

## 2024-04-26 ENCOUNTER — Inpatient Hospital Stay: Payer: Medicare Other | Attending: Oncology

## 2024-04-26 DIAGNOSIS — C9110 Chronic lymphocytic leukemia of B-cell type not having achieved remission: Secondary | ICD-10-CM | POA: Diagnosis present

## 2024-04-26 LAB — CBC WITH DIFFERENTIAL/PLATELET
Abs Immature Granulocytes: 0 K/uL (ref 0.00–0.07)
Basophils Absolute: 0 K/uL (ref 0.0–0.1)
Basophils Relative: 0 %
Eosinophils Absolute: 0.7 K/uL — ABNORMAL HIGH (ref 0.0–0.5)
Eosinophils Relative: 1 %
HCT: 35.5 % — ABNORMAL LOW (ref 39.0–52.0)
Hemoglobin: 11.2 g/dL — ABNORMAL LOW (ref 13.0–17.0)
Lymphocytes Relative: 86 %
Lymphs Abs: 57.3 K/uL — ABNORMAL HIGH (ref 0.7–4.0)
MCH: 28.4 pg (ref 26.0–34.0)
MCHC: 31.5 g/dL (ref 30.0–36.0)
MCV: 90.1 fL (ref 80.0–100.0)
Monocytes Absolute: 3.3 K/uL — ABNORMAL HIGH (ref 0.1–1.0)
Monocytes Relative: 5 %
Neutro Abs: 5.3 K/uL (ref 1.7–7.7)
Neutrophils Relative %: 8 %
Platelets: 175 K/uL (ref 150–400)
RBC: 3.94 MIL/uL — ABNORMAL LOW (ref 4.22–5.81)
RDW: 15.1 % (ref 11.5–15.5)
Smear Review: NORMAL
WBC: 66.6 K/uL (ref 4.0–10.5)
nRBC: 0.1 % (ref 0.0–0.2)

## 2024-04-26 NOTE — Telephone Encounter (Addendum)
 Spoke to [employer] in the lab.  Critical alert WBC 66.6 (read back completed).  Dr. Babara and [employer] informed.  Per Dr. Babara if patient is asymptomatic, eating well, no reflux, night sweats, weight loss, nor fever we can schedule an appt w/ Dr. Melanee when she gets back.  If patient is symptomatic schedule to see one of the other providers. Outbound call; voice message left.

## 2024-04-27 ENCOUNTER — Encounter: Payer: Self-pay | Admitting: Urology

## 2024-04-28 NOTE — Telephone Encounter (Signed)
 Outbound call to patient who reports no symptoms at all.  Reviewed recent lab work and patient agreed to an appointment with Dr.Rao when she returns.  Informed scheduling will be in touch shortly to coordinate.  Patient has no additional questions / concerns at this time.

## 2024-06-06 ENCOUNTER — Other Ambulatory Visit: Payer: Self-pay

## 2024-06-06 DIAGNOSIS — N529 Male erectile dysfunction, unspecified: Secondary | ICD-10-CM

## 2024-06-07 LAB — PSA: Prostate Specific Ag, Serum: <0.1 ng/mL (ref 0.0–4.0)

## 2024-06-08 ENCOUNTER — Ambulatory Visit: Admitting: Urology

## 2024-06-08 VITALS — BP 125/70 | HR 83 | Wt 169.0 lb

## 2024-06-08 DIAGNOSIS — C61 Malignant neoplasm of prostate: Secondary | ICD-10-CM

## 2024-06-08 DIAGNOSIS — N529 Male erectile dysfunction, unspecified: Secondary | ICD-10-CM

## 2024-06-08 MED ORDER — SILDENAFIL CITRATE 100 MG PO TABS: 100.0000 mg | ORAL_TABLET | Freq: Every day | ORAL | 11 refills | Status: AC | PRN

## 2024-06-08 NOTE — Progress Notes (Signed)
   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 prostate cancer in January 2020 Completed XRT spring 2020, PSA has been undetectable since that time ED responsive to sildenafil   Imaging/labs: PSA 06/06/2024 remains undetectable, stable from prior  Today: Denies any problems over the last year-no dysuria, gross hematuria, or urinary symptoms ED continues to be responsive to 100 mg sildenafil  on demand PSA remains undetectable  Plan:   Sildenafil  100 mg as needed refilled RTC 1 year PSA prior, if remains undetectable can likely transition care to PCP for yearly PSA and sildenafil  refills   Redell JAYSON Burnet, MD  [facility] [address] [place], [place] [address] [phone]

## 2024-06-09 ENCOUNTER — Ambulatory Visit: Payer: Self-pay | Admitting: Urology

## 2024-09-14 ENCOUNTER — Ambulatory Visit: Admitting: Urology

## 2024-10-28 ENCOUNTER — Inpatient Hospital Stay: Payer: Medicare Other | Attending: Nurse Practitioner

## 2024-10-28 ENCOUNTER — Inpatient Hospital Stay

## 2024-10-28 ENCOUNTER — Other Ambulatory Visit: Payer: Self-pay

## 2024-10-28 ENCOUNTER — Inpatient Hospital Stay: Payer: Medicare Other | Admitting: Nurse Practitioner

## 2024-10-28 ENCOUNTER — Encounter: Payer: Self-pay | Admitting: Nurse Practitioner

## 2024-10-28 VITALS — BP 131/68 | HR 81 | Temp 97.5°F | Resp 16 | Wt 168.0 lb

## 2024-10-28 DIAGNOSIS — D649 Anemia, unspecified: Secondary | ICD-10-CM | POA: Diagnosis not present

## 2024-10-28 DIAGNOSIS — C9110 Chronic lymphocytic leukemia of B-cell type not having achieved remission: Secondary | ICD-10-CM

## 2024-10-28 LAB — COMPREHENSIVE METABOLIC PANEL WITH GFR
ALT: 9 U/L (ref 0–44)
AST: 20 U/L (ref 15–41)
Albumin: 4.2 g/dL (ref 3.5–5.0)
Alkaline Phosphatase: 105 U/L (ref 38–126)
Anion gap: 13 (ref 5–15)
BUN: 16 mg/dL (ref 8–23)
CO2: 27 mmol/L (ref 22–32)
Calcium: 9.6 mg/dL (ref 8.9–10.3)
Chloride: 97 mmol/L — ABNORMAL LOW (ref 98–111)
Creatinine, Ser: 1.24 mg/dL (ref 0.61–1.24)
GFR, Estimated: 59 mL/min — ABNORMAL LOW
Glucose, Bld: 117 mg/dL — ABNORMAL HIGH (ref 70–99)
Potassium: 4.2 mmol/L (ref 3.5–5.1)
Sodium: 137 mmol/L (ref 135–145)
Total Bilirubin: 0.6 mg/dL (ref 0.0–1.2)
Total Protein: 8 g/dL (ref 6.5–8.1)

## 2024-10-28 LAB — FOLATE: Folate: >20 ng/mL

## 2024-10-28 LAB — CBC WITH DIFFERENTIAL/PLATELET
Abs Immature Granulocytes: 0.19 K/uL — ABNORMAL HIGH (ref 0.00–0.07)
Basophils Absolute: 0.1 K/uL (ref 0.0–0.1)
Basophils Relative: 0 %
Eosinophils Absolute: 0.2 K/uL (ref 0.0–0.5)
Eosinophils Relative: 0 %
HCT: 37.2 % — ABNORMAL LOW (ref 39.0–52.0)
Hemoglobin: 11.7 g/dL — ABNORMAL LOW (ref 13.0–17.0)
Immature Granulocytes: 0 %
Lymphocytes Relative: 80 %
Lymphs Abs: 53.3 K/uL — ABNORMAL HIGH (ref 0.7–4.0)
MCH: 27.3 pg (ref 26.0–34.0)
MCHC: 31.5 g/dL (ref 30.0–36.0)
MCV: 86.7 fL (ref 80.0–100.0)
Monocytes Absolute: 9 K/uL — ABNORMAL HIGH (ref 0.1–1.0)
Monocytes Relative: 13 %
Neutro Abs: 4.6 K/uL (ref 1.7–7.7)
Neutrophils Relative %: 7 %
Platelets: 174 K/uL (ref 150–400)
RBC: 4.29 MIL/uL (ref 4.22–5.81)
RDW: 16.4 % — ABNORMAL HIGH (ref 11.5–15.5)
Smear Review: NORMAL
WBC: 67.3 K/uL (ref 4.0–10.5)
nRBC: 0.1 % (ref 0.0–0.2)

## 2024-10-28 LAB — FERRITIN: Ferritin: 173 ng/mL (ref 24–336)

## 2024-10-28 LAB — IRON AND TIBC
Iron: 58 ug/dL (ref 45–182)
Saturation Ratios: 20 % (ref 17.9–39.5)
TIBC: 294 ug/dL (ref 250–450)
UIBC: 236 ug/dL

## 2024-10-28 LAB — TSH: TSH: 0.683 u[IU]/mL (ref 0.350–4.500)

## 2024-10-28 LAB — VITAMIN B12: Vitamin B-12: 1138 pg/mL — ABNORMAL HIGH (ref 180–914)

## 2024-10-28 NOTE — Progress Notes (Signed)
 "    Hematology/Oncology Consult note [facility]  [phone] [phone]  Patient Care Team: Rudolpho Norleen BIRCH, MD as PCP - General (Internal Medicine)   Name of the patient: Andre Jackson  [id number]  1946-06-15   Date of visit: 10/28/24  Diagnosis- Rai stage 0 CLL  Chief complaint/ Reason for visit- routine f/u of CLL  Heme/Onc history:  patient is a 79 year old African-American male with a past medical history significant for hypertension hyperlipidemia osteoarthritis and prostate cancer in the past.  He has been referred to us  for lymphocytosis/leukocytosis.His most recent CBC from 04/01/2021 showed a white count of 12.6, H&H of 13.5/41.9 with an MCV of 90.5 and a platelet count of 199.  Looking back at his CBC is over the last 5 years his white count has mostly remained normal between 7-9 but differential has shown some intermittent lymphocytosis in the past as well.     Results of blood work from 04/23/2021 showed a mildly elevated white count of 11.7 with an absolute lymphocyte count of 8.3.  Flow cytometry showed CD5 positive CD23 positive and [facility] CD7 weakly positive B-cell lymphoproliferative disorder cells were dim positive for CD20 and FMC7.  CD103 negative.  Findings consistent with atypical CLL/SLL.  Mantle cell lymphoma not ruled out.   CLL has not required any treatment so far   Interval history- Overall he is doing well. No changes in his appetite or weight. Denies any lumps anywhere.  He does report that over the past several weeks he has been waking up around 3 am hot but no night sweats.  No frequent infections.  Blood work reviewed with patient and his wife.  They report increased fatigue over the past 3 weeks.  Case discussed with Dr. Melanee as well.  His Hg has slowly trended down over the past year.  Hg last year was 13.2, today it is 11.7.  He denies seeing any blood in stool, no dark tarry stools but does report a history of  diverticulitis.  Today I will add on labs to check iron panel, vitamin b12, folate, and TSH to help determine the cause for anemia.  He will return to clinic in 1 month for repeat cbc and review results.  ECOG PS- 1 Pain scale- 0   Review of systems- Review of Systems  Constitutional:  Negative for chills, fever, malaise/fatigue and weight loss.  HENT:  Negative for congestion, ear discharge and nosebleeds.   Eyes:  Negative for blurred vision.  Respiratory:  Negative for cough, hemoptysis, sputum production, shortness of breath and wheezing.   Cardiovascular:  Negative for chest pain, palpitations, orthopnea and claudication.  Gastrointestinal:  Negative for abdominal pain, blood in stool, constipation, diarrhea, heartburn, melena, nausea and vomiting.  Genitourinary:  Negative for dysuria, flank pain, frequency, hematuria and urgency.  Musculoskeletal:  Negative for back pain, joint pain and myalgias.  Skin:  Negative for rash.  Neurological:  Negative for dizziness, tingling, focal weakness, seizures, weakness and headaches.  Endo/Heme/Allergies:  Does not bruise/bleed easily.  Psychiatric/Behavioral:  Negative for depression and suicidal ideas. The patient does not have insomnia.       No Known Allergies   Past Medical History:  Diagnosis Date   Arthritis    knees, hips (04/03/2015)   Cataract immature    Diverticulosis    ED (erectile dysfunction)    History of kidney stones 2016   Hx of colonic polyps    Hyperlipidemia    takes Lipitor daily  Hypertension    takes Prinizide daily   Joint pain    Joint swelling    Left knee DJD    OA (osteoarthritis)    Prostate cancer (HCC)    Seasonal allergies    takes Allegra daily     Past Surgical History:  Procedure Laterality Date   BACK SURGERY     COLONOSCOPY W/ BIOPSIES AND POLYPECTOMY  07/2011   have had it once before that also   I & D EXTREMITY Left ~ 2010   knee   JOINT REPLACEMENT     KNEE ARTHROSCOPY  Left ~ 2011   LIPOMA EXCISION  07/2011   neck   LUMBAR DISC SURGERY  1970's   MULTIPLE TOOTH EXTRACTIONS  ~ 2007   REPLACEMENT TOTAL KNEE Right 04/05/12   REVERSE SHOULDER ARTHROPLASTY Left 12/02/2017   Procedure: REVERSE SHOULDER ARTHROPLASTY;  Surgeon: Cristy Bonner DASEN, MD;  Location: [facility] OR;  Service: Orthopedics;  Laterality: Left;   TONSILLECTOMY AND ADENOIDECTOMY  1940's   TOTAL HIP ARTHROPLASTY Left 04/03/2015   TOTAL HIP ARTHROPLASTY Left 04/03/2015   Procedure: TOTAL HIP ARTHROPLASTY ANTERIOR APPROACH;  Surgeon: Evalene JONETTA Chancy, MD;  Location: [facility] OR;  Service: Orthopedics;  Laterality: Left;   TOTAL HIP ARTHROPLASTY Right 05/10/2019   Procedure: TOTAL HIP ARTHROPLASTY ANTERIOR APPROACH;  Surgeon: Chancy Evalene JONETTA, MD;  Location: [facility];  Service: Orthopedics;  Laterality: Right;   TOTAL KNEE ARTHROPLASTY  04/05/2012   Procedure: TOTAL KNEE ARTHROPLASTY;  Surgeon: Lamar DELENA Millman, MD;  Location: [facility] OR;  Service: Orthopedics;  Laterality: Right;  DR MILLMAN WANTS 90 MINUTES FOR THIS CASE   TOTAL KNEE ARTHROPLASTY  08/02/2012   Procedure: TOTAL KNEE ARTHROPLASTY;  Surgeon: Lamar DELENA Millman, MD;  Location: [facility] OR;  Service: Orthopedics;  Laterality: Left;    Social History   Socioeconomic History   Marital status: Married    Spouse name: Not on file   Number of children: Not on file   Years of education: Not on file   Highest education level: Not on file  Occupational History   Occupation: retired  Tobacco Use   Smoking status: Former    Current packs/day: 0.00    Average packs/day: 0.3 packs/day for 44.0 years (14.5 ttl pk-yrs)    Types: Cigarettes    Start date: 02/10/1965    Quit date: 02/10/2009    Years since quitting: 15.7    Passive exposure: Past   Smokeless tobacco: Never  Vaping Use   Vaping status: Never Used  Substance and Sexual Activity   Alcohol use: Yes    Comment: occasional   Drug use: No   Sexual activity: Yes  Other Topics Concern   Not on file  Social  History Narrative   Not on file   Social Drivers of Health   Tobacco Use: Medium Risk (10/28/2024)   Patient History    Smoking Tobacco Use: Former    Smokeless Tobacco Use: Never    Passive Exposure: Past  Physicist, Medical Strain: Not on [employer] Insecurity: Not on file  Transportation Needs: Not on file  Physical Activity: Not on file  Stress: Not on file  Social Connections: Not on file  Intimate Partner Violence: Not on file  Depression (PHQ2-9): Low Risk (10/28/2024)   Depression (PHQ2-9)    PHQ-2 Score: 0  Alcohol Screen: Not on file  Housing: Unknown (12/04/2023)   Received from [facility]   Epic    Unable to Pay for  Housing in the Last Year: Not on file    Number of Times Moved in the Last Year: Not on file    At any time in the past 12 months, were you homeless or living in a shelter (including now)?: No  Utilities: Not on file  Health Literacy: Not on file    Family History  Problem Relation Age of Onset   Cerebral aneurysm Mother    Hypertension Mother    Hypertension Sister    Hypertension Brother    Prostate cancer Neg Hx      Current Outpatient Medications:    acetaminophen  (TYLENOL ) 500 MG tablet, Take 500 mg by mouth daily as needed for moderate pain., Disp: , Rfl:    atorvastatin  (LIPITOR) 10 MG tablet, Take 10 mg by mouth daily., Disp: , Rfl:    lisinopril -hydrochlorothiazide  (PRINZIDE ,ZESTORETIC ) 20-25 MG per tablet, Take 1 tablet by mouth daily., Disp: , Rfl:    meloxicam  (MOBIC ) 15 MG tablet, Take 15 mg by mouth daily., Disp: , Rfl:    Multiple Vitamin (MULTIVITAMIN WITH MINERALS) TABS tablet, Take 1 tablet by mouth daily., Disp: , Rfl:    omeprazole  (PRILOSEC) 40 MG capsule, Take 40 mg by mouth daily., Disp: , Rfl:    sildenafil  (VIAGRA ) 100 MG tablet, Take 1 tablet (100 mg total) by mouth daily as needed for erectile dysfunction (take 30 minutes prior to sexual activity on an empty stomach)., Disp: 30 tablet, Rfl:  11  Physical exam:  Vitals:   10/28/24 1001  BP: 131/68  Pulse: 81  Resp: 16  Temp: (!) 97.5 F (36.4 C)  TempSrc: Tympanic  SpO2: 99%  Weight: 168 lb (76.2 kg)   Physical Exam Cardiovascular:     Rate and Rhythm: Normal rate and regular rhythm.     Heart sounds: Normal heart sounds.  Pulmonary:     Effort: Pulmonary effort is normal.     Breath sounds: Normal breath sounds.  Abdominal:     General: Bowel sounds are normal.     Palpations: Abdomen is soft.  Lymphadenopathy:     Comments: Palpable b/l non bulky cervical and inguinal adenopathy  Skin:    General: Skin is warm and dry.  Neurological:     Mental Status: He is alert and oriented to person, place, and time.         Latest Ref Rng & Units 10/28/2023    1:52 PM  CMP  Glucose 70 - 99 mg/dL 892   BUN 8 - 23 mg/dL 23   Creatinine 9.38 - 1.24 mg/dL 8.99   Sodium 864 - 854 mmol/L 139   Potassium 3.5 - 5.1 mmol/L 3.7   Chloride 98 - 111 mmol/L 99   CO2 22 - 32 mmol/L 29   Calcium  8.9 - 10.3 mg/dL 9.6   Total Protein 6.5 - 8.1 g/dL 7.8   Total Bilirubin 0.0 - 1.2 mg/dL 0.8   Alkaline Phos 38 - 126 U/L 76   AST 15 - 41 U/L 17   ALT 0 - 44 U/L 14       Latest Ref Rng & Units 04/26/2024    9:36 AM  CBC  WBC 4.0 - 10.5 K/uL 66.6   Hemoglobin 13.0 - 17.0 g/dL 88.7   Hematocrit 60.9 - 52.0 % 35.5   Platelets 150 - 400 K/uL 175   Last CBC Lab Results  Component Value Date   WBC 67.3 (HH) 10/28/2024   HGB 11.7 (L) 10/28/2024   HCT 37.2 (L)  10/28/2024   MCV 86.7 10/28/2024   MCH 27.3 10/28/2024   RDW 16.4 (H) 10/28/2024   PLT 174 10/28/2024       Latest Ref Rng & Units 10/28/2024    9:51 AM 04/26/2024    9:36 AM 10/28/2023    1:52 PM  CBC EXTENDED  WBC 4.0 - 10.5 K/uL 67.3  66.6  47.5   RBC 4.22 - 5.81 MIL/uL 4.29  3.94  4.73   Hemoglobin 13.0 - 17.0 g/dL 88.2  88.7  86.7   HCT 39.0 - 52.0 % 37.2  35.5  41.4   Platelets 150 - 400 K/uL 174  175  177   NEUT# 1.7 - 7.7 K/uL 4.6  5.3  4.8   Lymph# 0.7  - 4.0 K/uL 53.3  57.3  41.3     Assessment and plan- Patient is a 79 y.o. male history of Rai stage 0 CLL here for routine follow-up  On physical exam patient has palpable nonbulky cervical or inguinal adenopathy.  He does not report any B symptoms.  Platelet count remains normal at 175.  However Hg has trended down to 11.7.  Added additional labs today that include TSH, iron panel, ferritin, vitamin b12, folate to try and find a reason for trending down hg.  He has had chronic leukocytosis/lymphocytosis which has increased over the years but lymphocyte doubling time remains more than 6 months.  His CLL does not require any treatment at this time.  CBC with differential in 1 month to trend Hg and to f/u on these added labs.   Follow up plan:  F/U in 1 month see MD/NP with repeat labs cbc with diff LP    Morna Lanell MANDES [facility] [phone] 10/28/2024 10:07 AM                "

## 2024-12-05 ENCOUNTER — Inpatient Hospital Stay

## 2024-12-05 ENCOUNTER — Inpatient Hospital Stay: Admitting: Oncology

## 2024-12-06 ENCOUNTER — Inpatient Hospital Stay

## 2024-12-06 ENCOUNTER — Inpatient Hospital Stay: Admitting: Oncology

## 2025-05-31 ENCOUNTER — Other Ambulatory Visit

## 2025-06-07 ENCOUNTER — Ambulatory Visit: Admitting: Urology
